# Patient Record
Sex: Female | Born: 1955
Health system: Southern US, Community
[De-identification: ages and names within clinical notes are randomized; demographics above are authoritative.]

## PROBLEM LIST (undated history)

## (undated) DIAGNOSIS — T7840XA Allergy, unspecified, initial encounter: Secondary | ICD-10-CM

## (undated) DIAGNOSIS — I10 Essential (primary) hypertension: Secondary | ICD-10-CM

## (undated) DIAGNOSIS — M549 Dorsalgia, unspecified: Secondary | ICD-10-CM

## (undated) DIAGNOSIS — E559 Vitamin D deficiency, unspecified: Secondary | ICD-10-CM

## (undated) DIAGNOSIS — Z9889 Other specified postprocedural states: Secondary | ICD-10-CM

## (undated) DIAGNOSIS — E785 Hyperlipidemia, unspecified: Secondary | ICD-10-CM

## (undated) DIAGNOSIS — R112 Nausea with vomiting, unspecified: Secondary | ICD-10-CM

## (undated) DIAGNOSIS — M810 Age-related osteoporosis without current pathological fracture: Secondary | ICD-10-CM

## (undated) DIAGNOSIS — E669 Obesity, unspecified: Secondary | ICD-10-CM

## (undated) DIAGNOSIS — M1990 Unspecified osteoarthritis, unspecified site: Secondary | ICD-10-CM

## (undated) DIAGNOSIS — Z8619 Personal history of other infectious and parasitic diseases: Secondary | ICD-10-CM

## (undated) DIAGNOSIS — E039 Hypothyroidism, unspecified: Secondary | ICD-10-CM

## (undated) HISTORY — PX: APPENDECTOMY: SHX54

## (undated) HISTORY — DX: Hypothyroidism, unspecified: E03.9

## (undated) HISTORY — DX: Essential (primary) hypertension: I10

## (undated) HISTORY — PX: TONSILLECTOMY: SUR1361

## (undated) HISTORY — DX: Dorsalgia, unspecified: M54.9

## (undated) HISTORY — DX: Age-related osteoporosis without current pathological fracture: M81.0

## (undated) HISTORY — DX: Obesity, unspecified: E66.9

## (undated) HISTORY — DX: Hyperlipidemia, unspecified: E78.5

## (undated) HISTORY — DX: Allergy, unspecified, initial encounter: T78.40XA

## (undated) HISTORY — PX: OOPHORECTOMY: SHX86

## (undated) HISTORY — DX: Vitamin D deficiency, unspecified: E55.9

---

## 1995-02-16 HISTORY — PX: ABDOMINAL HYSTERECTOMY: SHX81

## 1998-04-02 ENCOUNTER — Other Ambulatory Visit: Admission: RE | Admit: 1998-04-02 | Discharge: 1998-04-02 | Payer: Self-pay | Admitting: *Deleted

## 1999-04-03 ENCOUNTER — Encounter: Payer: Self-pay | Admitting: *Deleted

## 1999-04-03 ENCOUNTER — Encounter: Admission: RE | Admit: 1999-04-03 | Discharge: 1999-04-03 | Payer: Self-pay | Admitting: *Deleted

## 2000-04-04 ENCOUNTER — Encounter: Payer: Self-pay | Admitting: Internal Medicine

## 2000-04-04 ENCOUNTER — Encounter: Admission: RE | Admit: 2000-04-04 | Discharge: 2000-04-04 | Payer: Self-pay | Admitting: Internal Medicine

## 2001-04-05 ENCOUNTER — Encounter: Payer: Self-pay | Admitting: Internal Medicine

## 2001-04-05 ENCOUNTER — Encounter: Admission: RE | Admit: 2001-04-05 | Discharge: 2001-04-05 | Payer: Self-pay | Admitting: Internal Medicine

## 2002-04-10 ENCOUNTER — Encounter: Payer: Self-pay | Admitting: Internal Medicine

## 2002-04-10 ENCOUNTER — Encounter: Admission: RE | Admit: 2002-04-10 | Discharge: 2002-04-10 | Payer: Self-pay | Admitting: Internal Medicine

## 2002-09-10 ENCOUNTER — Ambulatory Visit (HOSPITAL_COMMUNITY): Admission: RE | Admit: 2002-09-10 | Discharge: 2002-09-10 | Payer: Self-pay | Admitting: Gastroenterology

## 2002-12-03 ENCOUNTER — Encounter: Payer: Self-pay | Admitting: Internal Medicine

## 2002-12-03 ENCOUNTER — Encounter: Admission: RE | Admit: 2002-12-03 | Discharge: 2002-12-03 | Payer: Self-pay | Admitting: Internal Medicine

## 2002-12-07 ENCOUNTER — Encounter: Admission: RE | Admit: 2002-12-07 | Discharge: 2002-12-07 | Payer: Self-pay | Admitting: Internal Medicine

## 2002-12-07 ENCOUNTER — Encounter: Payer: Self-pay | Admitting: Internal Medicine

## 2003-04-16 ENCOUNTER — Encounter: Admission: RE | Admit: 2003-04-16 | Discharge: 2003-04-16 | Payer: Self-pay | Admitting: Internal Medicine

## 2003-07-05 ENCOUNTER — Encounter: Admission: RE | Admit: 2003-07-05 | Discharge: 2003-07-05 | Payer: Self-pay | Admitting: Internal Medicine

## 2003-11-14 ENCOUNTER — Encounter: Admission: RE | Admit: 2003-11-14 | Discharge: 2003-11-14 | Payer: Self-pay | Admitting: Internal Medicine

## 2004-04-21 ENCOUNTER — Encounter: Admission: RE | Admit: 2004-04-21 | Discharge: 2004-04-21 | Payer: Self-pay | Admitting: Internal Medicine

## 2004-07-08 ENCOUNTER — Other Ambulatory Visit: Admission: RE | Admit: 2004-07-08 | Discharge: 2004-07-08 | Payer: Self-pay | Admitting: Internal Medicine

## 2004-07-21 ENCOUNTER — Encounter: Admission: RE | Admit: 2004-07-21 | Discharge: 2004-07-21 | Payer: Self-pay | Admitting: Internal Medicine

## 2005-02-15 HISTORY — PX: SHOULDER ARTHROSCOPY W/ ROTATOR CUFF REPAIR: SHX2400

## 2005-04-26 ENCOUNTER — Encounter: Admission: RE | Admit: 2005-04-26 | Discharge: 2005-04-26 | Payer: Self-pay | Admitting: Internal Medicine

## 2006-03-02 ENCOUNTER — Encounter: Admission: RE | Admit: 2006-03-02 | Discharge: 2006-03-02 | Payer: Self-pay | Admitting: *Deleted

## 2006-03-07 ENCOUNTER — Encounter: Admission: RE | Admit: 2006-03-07 | Discharge: 2006-03-07 | Payer: Self-pay | Admitting: *Deleted

## 2006-04-20 ENCOUNTER — Encounter: Admission: RE | Admit: 2006-04-20 | Discharge: 2006-04-20 | Payer: Self-pay | Admitting: *Deleted

## 2006-05-03 ENCOUNTER — Encounter: Admission: RE | Admit: 2006-05-03 | Discharge: 2006-05-03 | Payer: Self-pay | Admitting: *Deleted

## 2006-08-09 ENCOUNTER — Other Ambulatory Visit: Admission: RE | Admit: 2006-08-09 | Discharge: 2006-08-09 | Payer: Self-pay | Admitting: *Deleted

## 2007-05-24 ENCOUNTER — Encounter: Admission: RE | Admit: 2007-05-24 | Discharge: 2007-05-24 | Payer: Self-pay | Admitting: Family Medicine

## 2007-10-30 ENCOUNTER — Encounter: Payer: Self-pay | Admitting: Family Medicine

## 2008-05-24 ENCOUNTER — Encounter: Admission: RE | Admit: 2008-05-24 | Discharge: 2008-05-24 | Payer: Self-pay | Admitting: Family Medicine

## 2008-06-11 ENCOUNTER — Encounter: Payer: Self-pay | Admitting: Family Medicine

## 2009-05-26 ENCOUNTER — Encounter: Admission: RE | Admit: 2009-05-26 | Discharge: 2009-05-26 | Payer: Self-pay | Admitting: Family Medicine

## 2009-12-17 ENCOUNTER — Encounter (INDEPENDENT_AMBULATORY_CARE_PROVIDER_SITE_OTHER): Payer: Self-pay | Admitting: *Deleted

## 2009-12-17 LAB — CONVERTED CEMR LAB
ALT: 24 units/L
AST: 20 units/L
Alkaline Phosphatase: 71 units/L
BUN: 16 mg/dL
Calcium: 10 mg/dL
Chloride, Serum: 102 mmol/L
Creatinine, Ser: 0.71 mg/dL
Glucose, Bld: 128 mg/dL
Hemoglobin: 8 g/dL
LDL Cholesterol: 107 mg/dL
Total Protein: 7.2 g/dL
WBC, blood: 7.8 10*3/uL

## 2010-02-15 DIAGNOSIS — Z8619 Personal history of other infectious and parasitic diseases: Secondary | ICD-10-CM

## 2010-02-15 HISTORY — DX: Personal history of other infectious and parasitic diseases: Z86.19

## 2010-03-25 ENCOUNTER — Encounter: Payer: Self-pay | Admitting: Family Medicine

## 2010-03-25 ENCOUNTER — Ambulatory Visit (INDEPENDENT_AMBULATORY_CARE_PROVIDER_SITE_OTHER): Payer: BC Managed Care – PPO | Admitting: Family Medicine

## 2010-03-25 DIAGNOSIS — F4320 Adjustment disorder, unspecified: Secondary | ICD-10-CM | POA: Insufficient documentation

## 2010-03-25 DIAGNOSIS — I10 Essential (primary) hypertension: Secondary | ICD-10-CM | POA: Insufficient documentation

## 2010-04-02 NOTE — Assessment & Plan Note (Signed)
Summary: new pt to estab--bcbs--ns fee///sph   Vital Signs:  Patient profile:   55 year old female Height:      67 inches Weight:      194 pounds BMI:     30.49 Pulse rate:   80 / minute BP sitting:   126 / 72  (left arm)  Vitals Entered By: Doristine Devoid CMA (March 25, 2010 9:57 AM) CC: NEW EST-no concerns   History of Present Illness: 55 yo woman here today to establish care.  Previous MD- Deboraha Sprang at [facility].  last CPE Spring of 2011.  1) HTN- just dx'd in Jan 2012 and started on Norvasc.  pt feels elevated BP is stress related- caring for mom who has a lot of health problems.  no CP, SOB, HAs, visual changes, edema.  2) mood- feeling overwhelmed, increased tearfulness, denies short temper.  sleeping well.  feels it is situational.  has good support system in place (husband, brother).  has scaled back amount of time she is spending at [employer]- this is helping.  has not been in counseling.  not interested in meds.  Preventive Screening-Counseling & Management  Alcohol-Tobacco     Alcohol drinks/day: <1     Smoking Status: never  Caffeine-Diet-Exercise     Does Patient Exercise: no      Sexual History:  currently monogamous.        Drug Use:  never.    Current Medications (verified): 1)  Norvasc 5 Mg Tabs (Amlodipine Besylate) .... Take One Tablet Daily 2)  Calcium 500 Mg Tabs (Calcium) .... Take One Tablet Daily 3)  Baby Aspirin 81 Mg Chew (Aspirin) .... Take One Tablet Daily 4)  Fish Oil  Oil (Fish Oil) .... Take One Tablet Daily  Allergies (verified): 1)  ! Codeine  Past History:  Past Medical History: Hypertension  Past Surgical History: Appendectomy Hysterectomy Oophorectomy Tonsillectomy  Family History: CAD-no HTN-mother,father,brother DM-mother STROKE-no COLON CA-no BREAST CA-mother age 42  Social History: married works as Physiological scientist step daughter, 2 grandkidsSmoking Status:  never Does Patient Exercise:  no Sexual History:   currently monogamous Drug Use:  never  Review of Systems      See HPI  Physical Exam  General:  Well-developed,well-nourished,in no acute distress; alert,appropriate and cooperative throughout examination Head:  Normocephalic and atraumatic without obvious abnormalities. No apparent alopecia or balding. Eyes:  PERRL, EOMI Neck:  No deformities, masses, or tenderness noted. Lungs:  Normal respiratory effort, chest expands symmetrically. Lungs are clear to auscultation, no crackles or wheezes. Heart:  Normal rate and regular rhythm. S1 and S2 normal without gallop, murmur, click, rub or other extra sounds. Pulses:  +2 carotid, radial, DP Extremities:  no C/C/E Neurologic:  alert & oriented X3, cranial nerves II-XII intact, strength normal in all extremities, gait normal, and DTRs symmetrical and normal.   Skin:  turgor normal and color normal.   Cervical Nodes:  No lymphadenopathy noted Psych:  Oriented X3, memory intact for recent and remote, normally interactive, good eye contact, not anxious appearing, and not depressed appearing.     Impression & Recommendations:  Problem # 1:  HYPERTENSION (ICD-401.9) Assessment New pt's BP well controlled.  asymptomatic.  tolerating meds w/out difficulty.  no changes, will continue to follow. Her updated medication list for this problem includes:    Norvasc 5 Mg Tabs (Amlodipine besylate) .Marland Kitchen... Take one tablet daily  Problem # 2:  ADJUSTMENT DISORDER (ICD-309.9) Assessment: New pt overwhelmed w/ caring for mother but  has made some recent changes that have improved situation.  has good support system in place.  not interested in meds at this time.  will follow.  Complete Medication List: 1)  Norvasc 5 Mg Tabs (Amlodipine besylate) .... Take one tablet daily 2)  Calcium 500 Mg Tabs (Calcium) .... Take one tablet daily 3)  Baby Aspirin 81 Mg Chew (Aspirin) .... Take one tablet daily 4)  Fish Oil Oil (Fish oil) .... Take one tablet  daily  Patient Instructions: 1)  We'll review your records and contact you about scheduling a physical 2)  Your blood pressure looks great!  Keep up the good work! 3)  It sounds like you're doing the best for your mom- hang in there!!! 4)  Call with any questions or concerns 5)  Welcome!  We're glad to have you!   Orders Added: 1)  New Patient Level II [99202]   Immunization History:  Pneumovax Immunization History:    Pneumovax:  historical (02/15/2009)   Immunization History:  Pneumovax Immunization History:    Pneumovax:  Historical (02/15/2009)   Preventive Care Screening  Last Tetanus Booster:    Date:  02/16/2007    Results:  Historical   Colonoscopy:    Date:  02/16/2007    Results:  normal

## 2010-04-07 ENCOUNTER — Encounter (INDEPENDENT_AMBULATORY_CARE_PROVIDER_SITE_OTHER): Payer: Self-pay | Admitting: *Deleted

## 2010-04-14 NOTE — Miscellaneous (Signed)
  Clinical Lists Changes  Observations: Added new observation of TSH: 0.22 microintl units/mL (12/17/2009 10:21) Added new observation of TRIGLYC TOT: 78 mg/dL (29/56/2130 86:57) Added new observation of LDL: 107 mg/dL (84/69/6295 28:41) Added new observation of HDL: 58 mg/dL (32/44/0102 72:53) Added new observation of CHOLESTEROL: 188 mg/dL (66/44/0347 42:59) Added new observation of BILI TOTAL: 0.6 mg/dL (56/38/7564 33:29) Added new observation of ALK PHOS: 71 units/L (12/17/2009 10:21) Added new observation of SGPT (ALT): 24 units/L (12/17/2009 10:21) Added new observation of SGOT (AST): 20 units/L (12/17/2009 10:21) Added new observation of HGBA1C: 6.1 % (12/17/2009 10:21) Added new observation of PROTEIN, TOT: 7.2 g/dL (51/88/4166 06:30) Added new observation of ALBUMIN: 5.0 g/dL (16/02/930 35:57) Added new observation of CALCIUM: 10.0 mg/dL (32/20/2542 70:62) Added new observation of GLUCOSE SER: 128 mg/dL (37/62/8315 17:61) Added new observation of CREATININE: 0.71 mg/dL (60/73/7106 26:94) Added new observation of BUN: 16 mg/dL (85/46/2703 50:09) Added new observation of CO2 TOTAL: 31 mmol/L (12/17/2009 10:21) Added new observation of CHLORIDE: 102 mmol/L (12/17/2009 10:21) Added new observation of POTASSIUM: 4.3 mmol/L (12/17/2009 10:21) Added new observation of SODIUM: 140 mmol/L (12/17/2009 10:21) Added new observation of PLATELETS: 237 10*3/mm3 (12/17/2009 10:21) Added new observation of MCH: 29.5 pg (12/17/2009 10:21) Added new observation of MCV: 87.0 fL (12/17/2009 10:21) Added new observation of HCT: 44.8 % (12/17/2009 10:21) Added new observation of HGB: 8 g/dL (38/18/2993 71:69) Added new observation of RBC: 5.15 10*6/mm3 (12/17/2009 10:21) Added new observation of WBC: 7.8 10*3/mm3 (12/17/2009 10:21) Added new observation of MAMMOGRAM: normal  (05/26/2009 10:48) Added new observation of BONE DENSITY: osteopenia std dev (06/11/2008 10:48) Changed observation from  COLONOSCOPY: normal (02/16/2007 10:01) to COLONOSCOPY: normal  (10/30/2007 10:01)      Preventive Care Screening  Mammogram:    Date:  05/26/2009    Results:  normal   Bone Density:    Date:  06/11/2008    Results:  osteopenia std dev

## 2010-04-14 NOTE — Procedures (Signed)
Summary: Colonoscopy Report/[facility]   Colonoscopy Report/[facility]   Imported By: Maryln Gottron 04/10/2010 13:09:05  _____________________________________________________________________  External Attachment:    Type:   Image     Comment:   External Document

## 2010-05-21 ENCOUNTER — Ambulatory Visit (INDEPENDENT_AMBULATORY_CARE_PROVIDER_SITE_OTHER): Payer: BC Managed Care – PPO | Admitting: Family Medicine

## 2010-05-21 ENCOUNTER — Encounter: Payer: Self-pay | Admitting: Family Medicine

## 2010-05-21 ENCOUNTER — Other Ambulatory Visit: Payer: Self-pay | Admitting: Family Medicine

## 2010-05-21 DIAGNOSIS — M549 Dorsalgia, unspecified: Secondary | ICD-10-CM | POA: Insufficient documentation

## 2010-05-21 DIAGNOSIS — I10 Essential (primary) hypertension: Secondary | ICD-10-CM

## 2010-05-21 DIAGNOSIS — B029 Zoster without complications: Secondary | ICD-10-CM

## 2010-05-21 DIAGNOSIS — Z1231 Encounter for screening mammogram for malignant neoplasm of breast: Secondary | ICD-10-CM

## 2010-05-21 MED ORDER — TRAMADOL HCL 50 MG PO TABS: 50.0000 mg | ORAL_TABLET | Freq: Four times a day (QID) | ORAL | Status: DC | PRN

## 2010-05-21 MED ORDER — VALACYCLOVIR HCL 1 G PO TABS: 1000.0000 mg | ORAL_TABLET | Freq: Three times a day (TID) | ORAL | Status: AC

## 2010-05-21 MED ORDER — AMLODIPINE BESYLATE 5 MG PO TABS: 5.0000 mg | ORAL_TABLET | Freq: Every day | ORAL | Status: DC

## 2010-05-21 NOTE — Assessment & Plan Note (Signed)
Pt's BP is poorly controlled today.  Feels this is due to pain.  Will not change meds at this time- last visit BP was well controlled.  Will follow.

## 2010-05-21 NOTE — Progress Notes (Signed)
  Subjective:    Patient ID: [name] [name], female    DOB: Oct 15, 1955, 55 y.o.   MRN: [id number]  HPI Rash- under L breast.  Noticed Monday.  Denies itching, drainage, pus.  Area is painful.  Back pain- started Sunday, thoracic on L side.  Did yard work on Saturday.  Advil w/out relief.  Thought she had pulled something but now isn't sure.  Pain is constant, no change w/ movement.  Feels this is contributing to elevated BP.  HTN- BP is elevated today.  Pt thinks it's due to pain.  No CP, SOB, HAs, visual changes, edema.   Review of Systems For ROS see HPI     Objective:   Physical Exam  Constitutional: She is oriented to person, place, and time. She appears well-developed and well-nourished. No distress.  HENT:  Head: Normocephalic and atraumatic.  Cardiovascular: Normal rate, regular rhythm and normal heart sounds.   Pulmonary/Chest: Effort normal and breath sounds normal. No respiratory distress.  Musculoskeletal: She exhibits no edema.       Thoracic back: She exhibits tenderness and pain. She exhibits normal range of motion, no swelling, no edema and no deformity.       Back:  Neurological: She is alert and oriented to person, place, and time.  Skin:       Erythematous area w/ scattered vesicles under L breast consistent w/ zoster          Assessment & Plan:

## 2010-05-21 NOTE — Assessment & Plan Note (Signed)
Start Valtrex.  Tramadol prn for pain.  Reviewed supportive care and red flags that should prompt return.  Pt expressed understanding and is in agreement w/ plan.

## 2010-05-21 NOTE — Patient Instructions (Signed)
Schedule your physical at your convenience in the next 3-6 weeks This is shingles and will unfortunately run its course The Valtrex will shorten the course and make it less severe Take the Tramadol as needed for pain Call with any questions or concerns Hang in there!!

## 2010-05-21 NOTE — Assessment & Plan Note (Signed)
Pt's back pain is along same dermatome as shingles.  Continue advil prn.  Add tramadol.

## 2010-05-25 ENCOUNTER — Telehealth: Payer: Self-pay | Admitting: *Deleted

## 2010-05-25 NOTE — Telephone Encounter (Signed)
Pt aware allergy list updated

## 2010-05-25 NOTE — Telephone Encounter (Signed)
Pt is allergic to codeine so traditional pain meds are not going to work.  Can take tylenol alternating w/ ibuprofen as needed for pain.  Will need to add tramadol to allergy list.

## 2010-05-25 NOTE — Telephone Encounter (Signed)
Pt states that ultram is causing her to vomit. Pt would like to know if something else can be Rx to help with shingles pain . Pt use [facility].

## 2010-05-29 ENCOUNTER — Ambulatory Visit: Payer: BC Managed Care – PPO

## 2010-06-12 ENCOUNTER — Ambulatory Visit
Admission: RE | Admit: 2010-06-12 | Discharge: 2010-06-12 | Disposition: A | Discharge status: Home / Self Care | Payer: BC Managed Care – PPO | Source: Ambulatory Visit | Attending: Family Medicine | Admitting: Family Medicine

## 2010-06-12 DIAGNOSIS — Z1231 Encounter for screening mammogram for malignant neoplasm of breast: Secondary | ICD-10-CM

## 2010-07-03 NOTE — Op Note (Signed)
   NAME:  [name], [name]                        ACCOUNT NO.:  [id number]   MEDICAL RECORD NO.:  [id number]                   PATIENT TYPE:  AMB   LOCATION:  ENDO                                 FACILITY:  [facility]   PHYSICIAN:  John C. Madilyn Fireman, M.D.                 DATE OF BIRTH:  03-27-1955   DATE OF PROCEDURE:  09/10/2002  DATE OF DISCHARGE:                                 OPERATIVE REPORT   PROCEDURE:  Colonoscopy.   INDICATIONS FOR PROCEDURE:  Family history of colon cancer.   DESCRIPTION OF PROCEDURE:  The patient was placed in the left lateral  decubitus position then placed on the pulse monitor with continuous low flow  oxygen delivered by nasal cannula. She was sedated with 62.5 mcg IV fentanyl  and 5 mg IV Versed. The Olympus video colonoscope was inserted into the  rectum and advanced to the cecum, confirmed by transillumination at  McBurney's point and visualization of the ileocecal valve and appendiceal  orifice. The prep was excellent. The cecum, ascending, transverse,  descending and sigmoid colon all appeared normal with no masses, polyps,  diverticula or other mucosal abnormalities. The rectum likewise appeared  normal and retroflexed view of the anus revealed no obvious internal  hemorrhoids. The colonoscope was then withdrawn and the patient returned to  the recovery room in stable condition. The patient tolerated the procedure  well and there were no immediate complications.   IMPRESSION:  Normal colonoscopy.   PLAN:  Repeat study in five years based on her family history.                                               John C. Madilyn Fireman, M.D.    J[facility]/MEDQ  D:  09/10/2002  T:  09/10/2002  Job:  [id number]   cc:   Darius Bump, M.D.  [place]  [place] [address]  Fax: [phone]

## 2010-07-29 ENCOUNTER — Encounter: Payer: Self-pay | Admitting: Family Medicine

## 2010-07-29 ENCOUNTER — Ambulatory Visit (INDEPENDENT_AMBULATORY_CARE_PROVIDER_SITE_OTHER): Payer: BC Managed Care – PPO | Admitting: Family Medicine

## 2010-07-29 VITALS — BP 120/80 | Temp 98.6°F | Ht 67.0 in | Wt 195.1 lb

## 2010-07-29 DIAGNOSIS — Z136 Encounter for screening for cardiovascular disorders: Secondary | ICD-10-CM

## 2010-07-29 DIAGNOSIS — E010 Iodine-deficiency related diffuse (endemic) goiter: Secondary | ICD-10-CM

## 2010-07-29 DIAGNOSIS — E049 Nontoxic goiter, unspecified: Secondary | ICD-10-CM

## 2010-07-29 DIAGNOSIS — E039 Hypothyroidism, unspecified: Secondary | ICD-10-CM | POA: Insufficient documentation

## 2010-07-29 DIAGNOSIS — Z0000 Encounter for general adult medical examination without abnormal findings: Secondary | ICD-10-CM | POA: Insufficient documentation

## 2010-07-29 DIAGNOSIS — M722 Plantar fascial fibromatosis: Secondary | ICD-10-CM | POA: Insufficient documentation

## 2010-07-29 HISTORY — DX: Hypothyroidism, unspecified: E03.9

## 2010-07-29 LAB — HEPATIC FUNCTION PANEL
ALT: 21 U/L (ref 0–35)
AST: 20 U/L (ref 0–37)
Alkaline Phosphatase: 73 U/L (ref 39–117)
Bilirubin, Direct: 0.1 mg/dL (ref 0.0–0.3)
Total Protein: 7.4 g/dL (ref 6.0–8.3)

## 2010-07-29 LAB — TSH: TSH: 0.05 u[IU]/mL — ABNORMAL LOW (ref 0.35–5.50)

## 2010-07-29 LAB — LIPID PANEL
Cholesterol: 199 mg/dL (ref 0–200)
HDL: 55.1 mg/dL (ref >39.00)
LDL Cholesterol: 132 mg/dL — ABNORMAL HIGH (ref 0–99)
Total CHOL/HDL Ratio: 4
Triglycerides: 60 mg/dL (ref 0.0–149.0)
VLDL: 12 mg/dL (ref 0.0–40.0)

## 2010-07-29 LAB — CBC WITH DIFFERENTIAL/PLATELET
Eosinophils Absolute: 0.2 10*3/uL (ref 0.0–0.7)
Hemoglobin: 14.3 g/dL (ref 12.0–15.0)
Lymphocytes Relative: 28.3 % (ref 12.0–46.0)
MCHC: 34.5 g/dL (ref 30.0–36.0)
MCV: 86.8 fl (ref 78.0–100.0)
Monocytes Relative: 6.7 % (ref 3.0–12.0)
Platelets: 235 10*3/uL (ref 150.0–400.0)
RBC: 4.78 Mil/uL (ref 3.87–5.11)
RDW: 12.6 % (ref 11.5–14.6)

## 2010-07-29 LAB — BASIC METABOLIC PANEL: GFR: 94.96 mL/min (ref >60.00)

## 2010-07-29 LAB — HEMOGLOBIN A1C: Hgb A1c MFr Bld: 6.7 % — ABNORMAL HIGH (ref 4.6–6.5)

## 2010-07-29 MED ORDER — NAPROXEN 500 MG PO TABS: 500.0000 mg | ORAL_TABLET | Freq: Two times a day (BID) | ORAL | Status: AC

## 2010-07-29 NOTE — Assessment & Plan Note (Signed)
Start scheduled NSAIDs, ice, and stretching for pain relief.  Reviewed supportive care and red flags that should prompt return.  Pt expressed understanding and is in agreement w/ plan.

## 2010-07-29 NOTE — Patient Instructions (Signed)
Follow up in 6 months to recheck BP Please check with the pharmacy and figure out why you are not getting the generic Amlodipine (norvasc)- call me if you have problems We'll notify you of your lab results Take the Naproxen twice a day for the next 7-10 days (w/ food) and then as needed for the foot pain Ice the foot over the water bottle 2x/day Do the gentle stretches ~3x/day Wear good supportive, cushioned shoes Call with any questions or concerns Hang in there!!!

## 2010-07-29 NOTE — Assessment & Plan Note (Signed)
Pt's PE WNL w/ exception of thyromegaly and plantar fasciitis (see below).  UTD on health maintenance.  Anticipatory guidance provided.  Check labs.

## 2010-07-29 NOTE — Progress Notes (Signed)
  Subjective:    Patient ID: [name] [name], female    DOB: Jul 18, 1955, 55 y.o.   MRN: [id number]  HPI CPE- s/p TAH-BSO, UTD on mammo and colonoscopy.  Plantar fasciitis- sxs started 3 weeks ago.  First step is most painful.  Pain is L foot, primarily in heel.  Wears flip-flops frequently.  Wears cushioned insoles in sneakers.  Hx of similar years ago.  Has not been taking OTC meds.    Review of Systems Patient reports no vision/ hearing changes, adenopathy,fever, weight change,  persistant/recurrent hoarseness , swallowing issues, chest pain, palpitations, edema, persistant/recurrent cough, hemoptysis, dyspnea (rest/exertional/paroxysmal nocturnal), gastrointestinal bleeding (melena, rectal bleeding), abdominal pain, significant heartburn, bowel changes, GU symptoms (dysuria, hematuria, incontinence), Gyn symptoms (abnormal  bleeding, pain),  syncope, focal weakness, memory loss, numbness & tingling, skin/hair/nail changes, abnormal bruising or bleeding, anxiety, or depression.      Objective:   Physical Exam  General Appearance:    Alert, cooperative, no distress, appears stated age  Head:    Normocephalic, without obvious abnormality, atraumatic  Eyes:    PERRL, conjunctiva/corneas clear, EOM's intact, fundi    benign, both eyes  Ears:    Normal TM's and external ear canals, both ears  Nose:   Nares normal, septum midline, mucosa normal, no drainage    or sinus tenderness  Throat:   Lips, mucosa, and tongue normal; teeth and gums normal  Neck:   Supple, symmetrical, trachea midline, no adenopathy;    Thyroid: R sided enlargement w/out obvious nodularity  Back:     Symmetric, no curvature, ROM normal, no CVA tenderness  Lungs:     Clear to auscultation bilaterally, respirations unlabored  Chest Wall:    No tenderness or deformity   Heart:    Regular rate and rhythm, S1 and S2 normal, no murmur, rub   or gallop  Breast Exam:    No tenderness, masses, or nipple abnormality    Abdomen:     Soft, non-tender, bowel sounds active all four quadrants,    no masses, no organomegaly  Genitalia:   Deferred due to TAH-BSO  Rectal:    Deferred  Extremities:   Extremities normal, atraumatic, no cyanosis or edema.  + TTP over insertion of plantar fascia on L  Pulses:   2+ and symmetric all extremities  Skin:   Skin color, texture, turgor normal, no rashes or lesions  Lymph nodes:   Cervical, supraclavicular, and axillary nodes normal  Neurologic:   CNII-XII intact, normal strength, sensation and reflexes    throughout          Assessment & Plan:

## 2010-07-29 NOTE — Assessment & Plan Note (Signed)
Get [place] to assess.  Check labs.

## 2010-07-30 LAB — T3, FREE: T3, Free: 3.7 pg/mL (ref 2.3–4.2)

## 2010-07-31 ENCOUNTER — Ambulatory Visit (HOSPITAL_BASED_OUTPATIENT_CLINIC_OR_DEPARTMENT_OTHER)
Admission: RE | Admit: 2010-07-31 | Discharge: 2010-07-31 | Disposition: A | Discharge status: Home / Self Care | Payer: BC Managed Care – PPO | Source: Ambulatory Visit | Attending: Family Medicine | Admitting: Family Medicine

## 2010-07-31 DIAGNOSIS — E049 Nontoxic goiter, unspecified: Secondary | ICD-10-CM | POA: Insufficient documentation

## 2010-07-31 DIAGNOSIS — E010 Iodine-deficiency related diffuse (endemic) goiter: Secondary | ICD-10-CM

## 2010-07-31 DIAGNOSIS — E041 Nontoxic single thyroid nodule: Secondary | ICD-10-CM

## 2010-08-01 LAB — VITAMIN D 1,25 DIHYDROXY: Vitamin D2 1, 25 (OH)2: <8 pg/mL

## 2010-08-03 ENCOUNTER — Telehealth: Payer: Self-pay

## 2010-08-03 DIAGNOSIS — E041 Nontoxic single thyroid nodule: Secondary | ICD-10-CM

## 2010-08-03 MED ORDER — ATORVASTATIN CALCIUM 40 MG PO TABS: 40.0000 mg | ORAL_TABLET | Freq: Every day | ORAL | Status: AC

## 2010-08-03 NOTE — Telephone Encounter (Signed)
Message copied by Arnette Norris on Mon Aug 03, 2010 11:56 AM ------      Message from: Sheliah Hatch      Created: Thu Jul 30, 2010 12:53 PM       She is officially diabetic.  At this stage she does not need to check her sugars but she really needs to watch her diet and work on regular exercise.  Will need a f/u appt in 3 months to recheck labs and discuss.            Thyroid hormone levels are normal despite and abnormal TSH.  Will repeat this at her appt in 3 months.            LDL is 132.  Since she is officially diabetic her goal is <70.  Needs to start Lipitor 40mg  nightly and recheck LFTs at upcoming appt.

## 2010-08-03 NOTE — Telephone Encounter (Signed)
All labs reviewed with patient and she voiced understanding. Rx sent to pharmacy and referral put in to see ENT for thyroid nodule.  She agreed to follow up in 3 months     KP

## 2010-08-27 ENCOUNTER — Other Ambulatory Visit: Payer: Self-pay | Admitting: Otolaryngology

## 2010-08-27 ENCOUNTER — Other Ambulatory Visit (HOSPITAL_COMMUNITY)
Admission: RE | Admit: 2010-08-27 | Discharge: 2010-08-27 | Disposition: A | Discharge status: Home / Self Care | Payer: BC Managed Care – PPO | Source: Ambulatory Visit | Attending: Otolaryngology | Admitting: Otolaryngology

## 2010-08-27 DIAGNOSIS — E049 Nontoxic goiter, unspecified: Secondary | ICD-10-CM | POA: Insufficient documentation

## 2010-11-06 ENCOUNTER — Encounter: Payer: Self-pay | Admitting: Family Medicine

## 2010-11-06 ENCOUNTER — Ambulatory Visit (INDEPENDENT_AMBULATORY_CARE_PROVIDER_SITE_OTHER): Payer: BC Managed Care – PPO | Admitting: Family Medicine

## 2010-11-06 DIAGNOSIS — J040 Acute laryngitis: Secondary | ICD-10-CM

## 2010-11-06 NOTE — Progress Notes (Signed)
  Subjective:    Patient ID: [name] [name], female    DOB: 1955/06/16, 55 y.o.   MRN: [id number]  HPI Laryngitis- no sore throat, no fevers, sxs started 4 days ago.  No ear pain, cough, mild nasal congestion, minimal PND, no known sick contacts.   Review of Systems For ROS see HPI     Objective:   Physical Exam  Vitals reviewed. Constitutional: She appears well-developed and well-nourished. No distress.  HENT:  Head: Normocephalic and atraumatic.       TMs normal bilaterally No TTP over sinuses + nasal congestion + PND  Eyes: Conjunctivae and EOM are normal. Pupils are equal, round, and reactive to light.  Neck: Normal range of motion. Neck supple.  Cardiovascular: Normal rate, regular rhythm and normal heart sounds.   Pulmonary/Chest: Effort normal and breath sounds normal. No respiratory distress. She has no wheezes. She has no rales.  Lymphadenopathy:    She has no cervical adenopathy.          Assessment & Plan:

## 2010-11-06 NOTE — Patient Instructions (Signed)
This is most likely a viral illness Take Advil (ibuprofen) 2 tabs every 4-6 hrs to help w/ the inflammation of the vocal cords VOCAL REST! Warm liquids Call with any questions or concerns Hang in there!!!

## 2010-11-06 NOTE — Assessment & Plan Note (Signed)
No evidence of bacterial infxn on PE.  Likely viral.  Reviewed supportive care and red flags that should prompt return.  Pt expressed understanding and is in agreement w/ plan.

## 2010-12-11 ENCOUNTER — Ambulatory Visit (INDEPENDENT_AMBULATORY_CARE_PROVIDER_SITE_OTHER): Payer: BC Managed Care – PPO | Admitting: Family Medicine

## 2010-12-11 ENCOUNTER — Encounter: Payer: Self-pay | Admitting: Family Medicine

## 2010-12-11 VITALS — BP 118/82 | HR 70 | Temp 98.5°F | Ht 67.5 in | Wt 193.0 lb

## 2010-12-11 DIAGNOSIS — R059 Cough, unspecified: Secondary | ICD-10-CM

## 2010-12-11 DIAGNOSIS — I10 Essential (primary) hypertension: Secondary | ICD-10-CM

## 2010-12-11 DIAGNOSIS — R05 Cough: Secondary | ICD-10-CM | POA: Insufficient documentation

## 2010-12-11 MED ORDER — BENZONATATE 200 MG PO CAPS: 200.0000 mg | ORAL_CAPSULE | Freq: Three times a day (TID) | ORAL | Status: AC | PRN

## 2010-12-11 MED ORDER — AMLODIPINE BESYLATE 5 MG PO TABS: 5.0000 mg | ORAL_TABLET | Freq: Every day | ORAL | Status: DC

## 2010-12-11 MED ORDER — AZITHROMYCIN 250 MG PO TABS: 250.0000 mg | ORAL_TABLET | Freq: Every day | ORAL | Status: AC

## 2010-12-11 NOTE — Patient Instructions (Signed)
This seems to be all allergy related Take the Azithromycin for any possible bronchitis Start Claritin or Zyrtec daily Add mucincex to thin your congestion Drink plenty of fluids Cough meds as needed Hang in there!!!

## 2010-12-11 NOTE — Assessment & Plan Note (Signed)
Pt's sxs most likely due to untreated nasal allergies and PND but given duration of sxs will start Azithromycin for possible bronchitis.  Reviewed supportive care and red flags that should prompt return.  Pt expressed understanding and is in agreement w/ plan.

## 2010-12-11 NOTE — Progress Notes (Signed)
  Subjective:    Patient ID: [name] [name], female    DOB: [birth year], 55 y.o.   MRN: [id number]  HPI Cough- sxs started 3-4 weeks ago.  Cough is nonproductive but wet sounding.  + nasal congestion- clear drainage.  Denies facial pain/pressure.  No ear pain.  No fevers.  No known sick contacts.  Has never had seasonal allergies.   Review of Systems For ROS see HPI     Objective:   Physical Exam  Vitals reviewed. Constitutional: She appears well-developed and well-nourished. No distress.  HENT:  Head: Normocephalic and atraumatic.  Right Ear: Tympanic membrane normal.  Left Ear: Tympanic membrane normal.  Nose: Mucosal edema and rhinorrhea present. Right sinus exhibits no maxillary sinus tenderness and no frontal sinus tenderness. Left sinus exhibits no maxillary sinus tenderness and no frontal sinus tenderness.  Mouth/Throat: Mucous membranes are normal. Posterior oropharyngeal erythema (w/ PND) present.  Eyes: Conjunctivae and EOM are normal. Pupils are equal, round, and reactive to light.  Neck: Normal range of motion. Neck supple.  Cardiovascular: Normal rate, regular rhythm and normal heart sounds.   Pulmonary/Chest: Effort normal and breath sounds normal. No respiratory distress. She has no wheezes. She has no rales.       + rattling cough  Lymphadenopathy:    She has no cervical adenopathy.          Assessment & Plan:

## 2010-12-16 ENCOUNTER — Other Ambulatory Visit: Payer: Self-pay | Admitting: Dermatology

## 2011-01-28 ENCOUNTER — Encounter: Payer: Self-pay | Admitting: Family Medicine

## 2011-01-28 ENCOUNTER — Ambulatory Visit (INDEPENDENT_AMBULATORY_CARE_PROVIDER_SITE_OTHER): Payer: BC Managed Care – PPO | Admitting: Family Medicine

## 2011-01-28 VITALS — BP 115/75 | HR 90 | Temp 98.0°F | Ht 67.25 in | Wt 191.4 lb

## 2011-01-28 DIAGNOSIS — Z131 Encounter for screening for diabetes mellitus: Secondary | ICD-10-CM

## 2011-01-28 DIAGNOSIS — I10 Essential (primary) hypertension: Secondary | ICD-10-CM

## 2011-01-28 DIAGNOSIS — E119 Type 2 diabetes mellitus without complications: Secondary | ICD-10-CM | POA: Insufficient documentation

## 2011-01-28 DIAGNOSIS — M8580 Other specified disorders of bone density and structure, unspecified site: Secondary | ICD-10-CM

## 2011-01-28 DIAGNOSIS — M899 Disorder of bone, unspecified: Secondary | ICD-10-CM

## 2011-01-28 DIAGNOSIS — M810 Age-related osteoporosis without current pathological fracture: Secondary | ICD-10-CM | POA: Insufficient documentation

## 2011-01-28 DIAGNOSIS — E785 Hyperlipidemia, unspecified: Secondary | ICD-10-CM

## 2011-01-28 LAB — HEPATIC FUNCTION PANEL
ALT: 23 U/L (ref 0–35)
AST: 24 U/L (ref 0–37)
Alkaline Phosphatase: 73 U/L (ref 39–117)
Bilirubin, Direct: 0 mg/dL (ref 0.0–0.3)
Total Bilirubin: 0.5 mg/dL (ref 0.3–1.2)

## 2011-01-28 LAB — BASIC METABOLIC PANEL
Chloride: 104 mEq/L (ref 96–112)
Potassium: 3.7 mEq/L (ref 3.5–5.1)
Sodium: 141 mEq/L (ref 135–145)

## 2011-01-28 LAB — LIPID PANEL
LDL Cholesterol: 106 mg/dL — ABNORMAL HIGH (ref 0–99)
Total CHOL/HDL Ratio: 3

## 2011-01-28 NOTE — Assessment & Plan Note (Signed)
New.  Pt started Lipitor- is tolerating this w/out difficulty.  Check labs.  Adjust meds prn

## 2011-01-28 NOTE — Patient Instructions (Signed)
Follow up in 3 months to recheck sugar We'll notify you of your lab results Try and make healthy food choices and get regular exercise Call with any questions or concerns Happy Holidays!!!

## 2011-01-28 NOTE — Assessment & Plan Note (Signed)
Due for repeat DEXA. 

## 2011-01-28 NOTE — Assessment & Plan Note (Signed)
Chronic problem.  Well controlled.  Asymptomatic.  No changes. 

## 2011-01-28 NOTE — Assessment & Plan Note (Signed)
dx'd based on labs at CPE.  Pt is not ready to accept this.  Did not follow up as recommended.  Due for labs today.  Will start meds prn.  Again encouraged healthy diet and regular exercise.  Pt expressed understanding and is in agreement w/ plan.

## 2011-01-28 NOTE — Progress Notes (Signed)
  Subjective:    Patient ID: [name] [name], female    DOB: 02/03/1956, 55 y.o.   MRN: [id number]  HPI HTN- chronic problem, excellent control today on Norvasc.  No CP, SOB, HAs, visual changes, edema.  DM- new problem for pt.  Not on meds.  Not exercising.  Her goal has been to lose weight but this has not happened.  Hyperlipidemia- new problem for pt.  Started Lipitor in June.  No abd pain, N/V, myalgias.  Osteopenia- had last DEXA 2/10.  Due for another.   Review of Systems For ROS see HPI     Objective:   Physical Exam  Vitals reviewed. Constitutional: She is oriented to person, place, and time. She appears well-developed and well-nourished. No distress.  HENT:  Head: Normocephalic and atraumatic.  Eyes: Conjunctivae and EOM are normal. Pupils are equal, round, and reactive to light.  Neck: Normal range of motion. Neck supple. No thyromegaly present.  Cardiovascular: Normal rate, regular rhythm, normal heart sounds and intact distal pulses.   No murmur heard. Pulmonary/Chest: Effort normal and breath sounds normal. No respiratory distress.  Abdominal: Soft. She exhibits no distension. There is no tenderness.  Musculoskeletal: She exhibits no edema.  Lymphadenopathy:    She has no cervical adenopathy.  Neurological: She is alert and oriented to person, place, and time.  Skin: Skin is warm and dry.  Psychiatric: She has a normal mood and affect. Her behavior is normal.          Assessment & Plan:

## 2011-02-24 ENCOUNTER — Other Ambulatory Visit: Payer: Self-pay | Admitting: Orthopedic Surgery

## 2011-03-01 ENCOUNTER — Encounter (HOSPITAL_BASED_OUTPATIENT_CLINIC_OR_DEPARTMENT_OTHER): Payer: Self-pay | Admitting: *Deleted

## 2011-03-01 NOTE — Progress Notes (Signed)
Pt to arrive 6am-need istat Says in hx-icd-pt denies icd-not on any blood thinner- No note by [place] pcp about this

## 2011-03-01 NOTE — H&P (Signed)
[name] [name] is a 56 year-old right-hand dominant female referred by Dr. Emily Filbert for consultation with respect to a mass on her right thumb.  This has been present for the past six to eight months.  It was drained approximately 10 days ago.  This has recurred.  She has no history of injury, no history of diabetes or thyroid problems. She was told this was a mucoid cyst. She complains of constant, moderate, sharp pain with a feeling of swelling.  Hitting the area causes problems for her. She has been wearing a Band-Aid for protection.   ALLERGIES:     Codeine.  MEDICATIONS:    Norvasc, multivitamins.  SURGICAL HISTORY:    Hysterectomy.  FAMILY MEDICAL HISTORY:   Positive for diabetes, high blood pressure and arthritis.  SOCIAL HISTORY:     She does not smoke or drink. She is married.  She is an Print production planner at [name]  REVIEW OF SYSTEMS:    Positive for high blood pressure, otherwise negative 14 points       [name] [name] is an 56 y.o. female.   Chief Complaint: mucoid cyst rt thumb HPI: see above   Past Medical History  Diagnosis Date  . Hypertension   . Hyperlipidemia   . Diabetes mellitus     diet controlled    Past Surgical History  Procedure Date  . Appendectomy   . Abdominal hysterectomy   . Tonsillectomy   . Oophorectomy   . Shoulder arthroscopy w/ rotator cuff repair 2007    rt    Family History  Problem Relation Age of Onset  . Hypertension Mother   . Hypertension Father   . Hypertension Brother   . Diabetes Mother   . Breast cancer Mother 67   Social History:  reports that she has never smoked. She does not have any smokeless tobacco history on file. She reports that she does not drink alcohol or use illicit drugs.  Allergies:  Allergies  Allergen Reactions  . Codeine   . Tramadol     Medications Prior to Admission  Medication Dose Route Frequency Provider Last Rate Last Dose  . ceFAZolin (ANCEF) IVPB 2 g/50 mL premix  2 g  Intravenous 60 min Pre-Op Nicki Reaper, MD      . chlorhexidine (HIBICLENS) 4 % liquid 4 application  60 mL Topical Once       . lactated ringers infusion   Intravenous Continuous Zenon Mayo, MD 10 mL/hr at 03/02/11 1046    . DISCONTD: ceFAZolin (ANCEF) IVPB 1 g/50 mL premix  1 g Intravenous 60 min Pre-Op        Medications Prior to Admission  Medication Sig Dispense Refill  . amLODipine (NORVASC) 5 MG tablet Take 1 tablet (5 mg total) by mouth daily.  30 tablet  6  . Ascorbic Acid (VITAMIN C) 1000 MG tablet Take 1,000 mg by mouth daily.        Marland Kitchen atorvastatin (LIPITOR) 40 MG tablet Take 1 tablet (40 mg total) by mouth daily.  30 tablet  2  . calcium carbonate (CALCIUM 500) 1250 MG tablet Take 1 tablet by mouth daily.        . Cholecalciferol (VITAMIN D-3) 5000 UNITS TABS Take by mouth daily.        Marland Kitchen glucosamine-chondroitin 500-400 MG tablet Take 1 tablet by mouth daily.        . Multiple Vitamin (MULTIVITAMIN) tablet Take 1 tablet by mouth daily.        Marland Kitchen  mupirocin ointment (BACTROBAN) 2 %       . naproxen (NAPROSYN) 500 MG tablet Take 1 tablet (500 mg total) by mouth 2 (two) times daily with a meal.  60 tablet  2  . aspirin 81 MG EC tablet Take 81 mg by mouth daily.        . Omega-3 Fatty Acids (FISH OIL PO) Take by mouth daily.          Results for orders placed during the hospital encounter of 03/02/11 (from the past 48 hour(s))  POCT I-STAT, CHEM 8     Status: Abnormal   Collection Time   03/02/11 10:42 AM      Component Value Range Comment   Sodium 143  135 - 145 (mEq/L)    Potassium 4.4  3.5 - 5.1 (mEq/L)    Chloride 105  96 - 112 (mEq/L)    BUN 13  6 - 23 (mg/dL)    Creatinine, Ser 0.86  0.50 - 1.10 (mg/dL)    Glucose, Bld 578 (*) 70 - 99 (mg/dL)    Calcium, Ion 4.69  1.12 - 1.32 (mmol/L)    TCO2 27  0 - 100 (mmol/L)    Hemoglobin 15.0  12.0 - 15.0 (g/dL)    HCT 62.9  52.8 - 41.3 (%)     No results found.   Pertinent items are noted in HPI.  Blood pressure  130/85, pulse 72, temperature 98.5 F (36.9 C), temperature source Oral, resp. rate 16, height 5\' 7"  (1.702 m), weight 86.183 kg (190 lb), SpO2 97.00%.  General appearance: alert, cooperative and appears stated age Head: Normocephalic, without obvious abnormality Neck: no adenopathy Resp: clear to auscultation bilaterally Cardio: regular rate and rhythm, S1, S2 normal, no murmur, click, rub or gallop GI: soft, non-tender; bowel sounds normal; no masses,  no organomegaly Extremities: extremities normal, atraumatic, no cyanosis or edema Pulses: 2+ and symmetric Skin: Skin color, texture, turgor normal. No rashes or lesions Neurologic: Grossly normal Incision/Wound: na  Assessment/Plan We have discussed the etiology of the mucoid cyst with her and her husband.   We would recommend surgical excision with debridement of the joint.  The pre, peri and postoperative course were discussed along with the risks and complications.  The patient is aware that even with debridement of the joint there is potential for recurrence and possibility of infection, recurrence, injury to arteries, nerves, tendon, stiffness are all discussed.  The possibility of the necessity of fusion to be certain that another cyst does not occur, but we would not recommend that at this time.  They agree to proceed to have this excised. She is scheduled for excision mucoid cyst, debridement interphalangeal joint right thumb as an outpatient at [facility].     Dejuana Weist R 03/02/2011, 11:32 AM

## 2011-03-02 ENCOUNTER — Encounter (HOSPITAL_BASED_OUTPATIENT_CLINIC_OR_DEPARTMENT_OTHER): Payer: Self-pay | Admitting: Anesthesiology

## 2011-03-02 ENCOUNTER — Encounter (HOSPITAL_BASED_OUTPATIENT_CLINIC_OR_DEPARTMENT_OTHER)
Admission: RE | Disposition: A | Discharge status: Home / Self Care | Payer: Self-pay | Source: Ambulatory Visit | Attending: Orthopedic Surgery

## 2011-03-02 ENCOUNTER — Other Ambulatory Visit: Payer: Self-pay | Admitting: Orthopedic Surgery

## 2011-03-02 ENCOUNTER — Encounter (HOSPITAL_BASED_OUTPATIENT_CLINIC_OR_DEPARTMENT_OTHER): Payer: Self-pay | Admitting: *Deleted

## 2011-03-02 ENCOUNTER — Encounter (HOSPITAL_BASED_OUTPATIENT_CLINIC_OR_DEPARTMENT_OTHER): Payer: Self-pay | Admitting: Orthopedic Surgery

## 2011-03-02 ENCOUNTER — Ambulatory Visit (HOSPITAL_BASED_OUTPATIENT_CLINIC_OR_DEPARTMENT_OTHER): Payer: BC Managed Care – PPO | Admitting: Anesthesiology

## 2011-03-02 ENCOUNTER — Ambulatory Visit (HOSPITAL_BASED_OUTPATIENT_CLINIC_OR_DEPARTMENT_OTHER)
Admission: RE | Admit: 2011-03-02 | Discharge: 2011-03-02 | Disposition: A | Discharge status: Home / Self Care | Payer: BC Managed Care – PPO | Source: Ambulatory Visit | Attending: Orthopedic Surgery | Admitting: Orthopedic Surgery

## 2011-03-02 DIAGNOSIS — I10 Essential (primary) hypertension: Secondary | ICD-10-CM | POA: Insufficient documentation

## 2011-03-02 DIAGNOSIS — E119 Type 2 diabetes mellitus without complications: Secondary | ICD-10-CM | POA: Insufficient documentation

## 2011-03-02 DIAGNOSIS — M6740 Ganglion, unspecified site: Secondary | ICD-10-CM | POA: Insufficient documentation

## 2011-03-02 HISTORY — PX: MASS EXCISION: SHX2000

## 2011-03-02 LAB — POCT I-STAT, CHEM 8
HCT: 44 % (ref 36.0–46.0)
Hemoglobin: 15 g/dL (ref 12.0–15.0)
Potassium: 4.4 mEq/L (ref 3.5–5.1)
Sodium: 143 mEq/L (ref 135–145)

## 2011-03-02 SURGERY — EXCISION MASS
Anesthesia: Monitor Anesthesia Care | Site: Thumb | Laterality: Right | Wound class: Clean

## 2011-03-02 MED ORDER — PROPOFOL 10 MG/ML IV EMUL
INTRAVENOUS | Status: DC | PRN
  Administered 2011-03-02: 80 ug/kg/min via INTRAVENOUS

## 2011-03-02 MED ORDER — CEFAZOLIN SODIUM 1-5 GM-% IV SOLN: 1.0000 g | INTRAVENOUS | Status: DC

## 2011-03-02 MED ORDER — BUPIVACAINE HCL (PF) 0.25 % IJ SOLN
INTRAMUSCULAR | Status: DC | PRN
  Administered 2011-03-02: 5 mL

## 2011-03-02 MED ORDER — CEFAZOLIN SODIUM-DEXTROSE 2-3 GM-% IV SOLR: 2.0000 g | INTRAVENOUS | Status: DC

## 2011-03-02 MED ORDER — FENTANYL CITRATE 0.05 MG/ML IJ SOLN
INTRAMUSCULAR | Status: DC | PRN
  Administered 2011-03-02: 100 ug via INTRAVENOUS

## 2011-03-02 MED ORDER — CEFAZOLIN SODIUM 1-5 GM-% IV SOLN
1.0000 g | INTRAVENOUS | Status: AC
  Administered 2011-03-02: 2 g via INTRAVENOUS

## 2011-03-02 MED ORDER — PENTAZOCINE-NALOXONE 50-0.5 MG PO TABS: 1.0000 | ORAL_TABLET | ORAL | Status: AC | PRN

## 2011-03-02 MED ORDER — MORPHINE SULFATE 2 MG/ML IJ SOLN: 0.0500 mg/kg | INTRAMUSCULAR | Status: DC | PRN

## 2011-03-02 MED ORDER — LIDOCAINE HCL (CARDIAC) 20 MG/ML IV SOLN
INTRAVENOUS | Status: DC | PRN
  Administered 2011-03-02: 60 mg via INTRAVENOUS

## 2011-03-02 MED ORDER — CHLORHEXIDINE GLUCONATE 4 % EX LIQD: 60.0000 mL | Freq: Once | CUTANEOUS | Status: DC

## 2011-03-02 MED ORDER — LACTATED RINGERS IV SOLN
INTRAVENOUS | Status: DC
  Administered 2011-03-02: 11:00:00 via INTRAVENOUS

## 2011-03-02 MED ORDER — 0.9 % SODIUM CHLORIDE (POUR BTL) OPTIME
TOPICAL | Status: DC | PRN
  Administered 2011-03-02: 1000 mL

## 2011-03-02 MED ORDER — MIDAZOLAM HCL 5 MG/5ML IJ SOLN
INTRAMUSCULAR | Status: DC | PRN
  Administered 2011-03-02: 2 mg via INTRAVENOUS

## 2011-03-02 MED ORDER — FENTANYL CITRATE 0.05 MG/ML IJ SOLN: 25.0000 ug | INTRAMUSCULAR | Status: DC | PRN

## 2011-03-02 MED ORDER — METOCLOPRAMIDE HCL 5 MG/ML IJ SOLN: 10.0000 mg | Freq: Once | INTRAMUSCULAR | Status: DC | PRN

## 2011-03-02 SURGICAL SUPPLY — 48 items
BANDAGE COBAN STERILE 2 (GAUZE/BANDAGES/DRESSINGS) IMPLANT
BANDAGE GAUZE ELAST BULKY 4 IN (GAUZE/BANDAGES/DRESSINGS) IMPLANT
BLADE MINI RND TIP GREEN BEAV (BLADE) IMPLANT
BLADE SURG 15 STRL LF DISP TIS (BLADE) ×1 IMPLANT
BLADE SURG 15 STRL SS (BLADE) ×2
BNDG CMPR 9X4 STRL LF SNTH (GAUZE/BANDAGES/DRESSINGS)
BNDG COHESIVE 1X5 TAN STRL LF (GAUZE/BANDAGES/DRESSINGS) ×1 IMPLANT
BNDG COHESIVE 3X5 TAN STRL LF (GAUZE/BANDAGES/DRESSINGS) IMPLANT
BNDG ESMARK 4X9 LF (GAUZE/BANDAGES/DRESSINGS) IMPLANT
CHLORAPREP W/TINT 26ML (MISCELLANEOUS) ×2 IMPLANT
CLOTH BEACON ORANGE TIMEOUT ST (SAFETY) ×1 IMPLANT
CORDS BIPOLAR (ELECTRODE) ×2 IMPLANT
COVER MAYO STAND STRL (DRAPES) ×2 IMPLANT
COVER TABLE BACK 60X90 (DRAPES) ×2 IMPLANT
CUFF TOURNIQUET SINGLE 18IN (TOURNIQUET CUFF) ×1 IMPLANT
DECANTER SPIKE VIAL GLASS SM (MISCELLANEOUS) IMPLANT
DRAIN PENROSE 1/2X12 LTX STRL (WOUND CARE) IMPLANT
DRAPE EXTREMITY T 121X128X90 (DRAPE) ×2 IMPLANT
DRAPE SURG 17X23 STRL (DRAPES) ×2 IMPLANT
GAUZE SPONGE 4X4 12PLY STRL LF (GAUZE/BANDAGES/DRESSINGS) ×1 IMPLANT
GAUZE XEROFORM 1X8 LF (GAUZE/BANDAGES/DRESSINGS) ×2 IMPLANT
GLOVE BIO SURGEON STRL SZ 6.5 (GLOVE) ×3 IMPLANT
GLOVE SURG ORTHO 8.0 STRL STRW (GLOVE) ×2 IMPLANT
GOWN BRE IMP PREV XXLGXLNG (GOWN DISPOSABLE) ×3 IMPLANT
GOWN PREVENTION PLUS XLARGE (GOWN DISPOSABLE) ×3 IMPLANT
NDL SAFETY ECLIPSE 18X1.5 (NEEDLE) ×1 IMPLANT
NEEDLE 27GAX1X1/2 (NEEDLE) IMPLANT
NEEDLE HYPO 18GX1.5 SHARP (NEEDLE) ×2
NS IRRIG 1000ML POUR BTL (IV SOLUTION) ×2 IMPLANT
PACK BASIN DAY SURGERY FS (CUSTOM PROCEDURE TRAY) ×2 IMPLANT
PAD CAST 3X4 CTTN HI CHSV (CAST SUPPLIES) IMPLANT
PADDING CAST ABS 3INX4YD NS (CAST SUPPLIES)
PADDING CAST ABS 4INX4YD NS (CAST SUPPLIES) ×1
PADDING CAST ABS COTTON 3X4 (CAST SUPPLIES) IMPLANT
PADDING CAST ABS COTTON 4X4 ST (CAST SUPPLIES) ×1 IMPLANT
PADDING CAST COTTON 3X4 STRL (CAST SUPPLIES)
SPLINT PLASTER CAST XFAST 3X15 (CAST SUPPLIES) IMPLANT
SPLINT PLASTER XTRA FASTSET 3X (CAST SUPPLIES)
SPONGE GAUZE 4X4 12PLY (GAUZE/BANDAGES/DRESSINGS) ×2 IMPLANT
STOCKINETTE 4X48 STRL (DRAPES) ×2 IMPLANT
SUT VIC AB 4-0 P2 18 (SUTURE) IMPLANT
SUT VICRYL RAPID 5 0 P 3 (SUTURE) IMPLANT
SUT VICRYL RAPIDE 4/0 PS 2 (SUTURE) ×2 IMPLANT
SYR BULB 3OZ (MISCELLANEOUS) ×2 IMPLANT
SYR CONTROL 10ML LL (SYRINGE) IMPLANT
TOWEL OR 17X24 6PK STRL BLUE (TOWEL DISPOSABLE) ×4 IMPLANT
UNDERPAD 30X30 INCONTINENT (UNDERPADS AND DIAPERS) ×2 IMPLANT
WATER STERILE IRR 1000ML POUR (IV SOLUTION) ×2 IMPLANT

## 2011-03-02 NOTE — Anesthesia Postprocedure Evaluation (Signed)
Anesthesia Post Note  Patient: [name] [name]  Procedure(s) Performed:  EXCISION MASS - Excision Cyst Interphangeal Right Thumb  Anesthesia type: General  Patient location: PACU  Post pain: Pain level controlled  Post assessment: Patient's Cardiovascular Status Stable  Last Vitals:  Filed Vitals:   03/02/11 1310  BP: 134/80  Pulse: 71  Temp: 36.6 C  Resp: 18    Post vital signs: Reviewed and stable  Level of consciousness: alert  Complications: No apparent anesthesia complications

## 2011-03-02 NOTE — Anesthesia Preprocedure Evaluation (Signed)
Anesthesia Evaluation  Patient identified by MRN, date of birth, ID band Patient awake    Reviewed: Allergy & Precautions, H&P , NPO status , Patient's Chart, lab work & pertinent test results, reviewed documented beta blocker date and time   Airway Mallampati: II TM Distance: >3 FB Neck ROM: full    Dental   Pulmonary neg pulmonary ROS,          Cardiovascular hypertension, On Medications     Neuro/Psych PSYCHIATRIC DISORDERS Negative Neurological ROS     GI/Hepatic negative GI ROS, Neg liver ROS,   Endo/Other  Diabetes mellitus-, Well Controlled  Renal/GU negative Renal ROS  Genitourinary negative   Musculoskeletal   Abdominal   Peds  Hematology negative hematology ROS (+)   Anesthesia Other Findings See surgeon's H&P   Reproductive/Obstetrics negative OB ROS                           Anesthesia Physical Anesthesia Plan  ASA: II  Anesthesia Plan: MAC and Bier Block   Post-op Pain Management:    Induction: Intravenous  Airway Management Planned: Simple Face Mask  Additional Equipment:   Intra-op Plan:   Post-operative Plan:   Informed Consent: I have reviewed the patients History and Physical, chart, labs and discussed the procedure including the risks, benefits and alternatives for the proposed anesthesia with the patient or authorized representative who has indicated his/her understanding and acceptance.     Plan Discussed with: CRNA and Surgeon  Anesthesia Plan Comments:         Anesthesia Quick Evaluation

## 2011-03-02 NOTE — Transfer of Care (Signed)
Immediate Anesthesia Transfer of Care Note  Patient: [name] [name]  Procedure(s) Performed:  EXCISION MASS - Excision Cyst Interphangeal Right Thumb  Patient Location: PACU  Anesthesia Type: Bier block  Level of Consciousness: awake, alert , oriented and patient cooperative  Airway & Oxygen Therapy: Patient Spontanous Breathing and Patient connected to face mask oxygen  Post-op Assessment: Report given to PACU RN and Post -op Vital signs reviewed and stable  Post vital signs: Reviewed and stable Filed Vitals:   03/02/11 1030  BP: 130/85  Pulse: 72  Temp: 36.9 C  Resp: 16    Complications: No apparent anesthesia complications

## 2011-03-02 NOTE — Brief Op Note (Signed)
03/02/2011  12:19 PM  PATIENT:  [name] [name]  56 y.o. female  PRE-OPERATIVE DIAGNOSIS:  Mucoid tumor right thumb  POST-OPERATIVE DIAGNOSIS:  Mucoid tumor right thumb  PROCEDURE:  Procedure(s): EXCISION MASS  SURGEON:  Surgeon(s): Nicki Reaper, MD Tami Ribas  PHYSICIAN ASSISTANT:   ASSISTANTS: Karlyn Agee MD   ANESTHESIA:   local and regional  EBL:  Total I/O In: 700 [I.V.:700] Out: -   BLOOD ADMINISTERED:none  DRAINS: none   LOCAL MEDICATIONS USED:  MARCAINE 5CC  SPECIMEN:  Excision  DISPOSITION OF SPECIMEN:  PATHOLOGY  COUNTS:  YES  TOURNIQUET:   Total Tourniquet Time Documented: Forearm (Right) - 19 minutes  DICTATION: .Other Dictation: Dictation Number [id number]  PLAN OF CARE: Discharge to home after PACU  PATIENT DISPOSITION:  PACU - hemodynamically stable.

## 2011-03-02 NOTE — Anesthesia Procedure Notes (Signed)
Procedure Name: MAC Date/Time: 03/02/2011 11:54 AM Performed by: Gladys Damme Pre-anesthesia Checklist: Patient identified, Timeout performed, Emergency Drugs available, Suction available and Patient being monitored Patient Re-evaluated:Patient Re-evaluated prior to inductionOxygen Delivery Method: Simple face mask Placement Confirmation: CO2 detector

## 2011-03-02 NOTE — Op Note (Signed)
dictated number: [id number]

## 2011-03-03 ENCOUNTER — Encounter (HOSPITAL_BASED_OUTPATIENT_CLINIC_OR_DEPARTMENT_OTHER): Payer: Self-pay | Admitting: Orthopedic Surgery

## 2011-03-03 NOTE — Op Note (Signed)
NAMEALTHA, SWEITZER              ACCOUNT NO.:  [id number]  MEDICAL RECORD NO.:  [id number]  LOCATION:                                 FACILITY:  PHYSICIAN:  Cindee Salt, M.D.       DATE OF BIRTH:  05-01-1955  DATE OF PROCEDURE:  03/02/2011 DATE OF DISCHARGE:                              OPERATIVE REPORT   PREOPERATIVE DIAGNOSIS:  Mucoid tumor, interphalangeal joint, right thumb.  POSTOPERATIVE DIAGNOSIS:  Mucoid tumor, interphalangeal joint, right thumb.  OPERATION:  Excision of cyst, debridement of interphalangeal joint, right thumb.  SURGEON: Cindee Salt, MD  ASSISTANT:  Betha Loa, MD  ANESTHESIA:  Forearm-based IV regional with metacarpal block.  ANESTHESIOLOGIST:  Janetta Hora. Gelene Mink, MD  HISTORY:  The patient is a 56 year old female with a history of mucoid cyst.  She is desirous having this removed.  Pre, peri, and postoperative course have been discussed along with risks and complications.  She is aware that there is no guarantee with the surgery, possibility of infection, recurrence, injury to arteries, nerves, tendons, incomplete relief of symptoms, and dystrophy.  In the preoperative area, the patient is seen, the extremity marked by both the patient and surgeon.  Antibiotic given.  PROCEDURE:  The patient brought to the operating room where a forearm- based IV regional anesthetic was carried out without difficulty.  She was prepped using ChloraPrep, supine position with the right arm free. A 3-minute dry time was allowed.  Time-out taken, confirming the patient and procedure.  A curvilinear incision was made proximal to the mass, carried down through subcutaneous tissue.  Bleeders were electrocauterized with bipolar beneath the skin.  The cyst was followed distally.  With blunt and sharp dissection, this was removed, sent to pathology.  The joint was then opened on its radial aspect.  A large osteophyte was present proximally.  This was debrided with a  rongeur along with a synovectomy of the joint.  The wound was irrigated.  The skin was closed with interrupted 5-0 Vicryl repeat sutures.  A metacarpal block was then given with 0.25% Marcaine without epinephrine, 5 mL was used.  A sterile compressive dressing, splint to the finger was applied.  On deflation of the tourniquet, the remaining fingers turned pink.  She was taken to the recovery room for observation in satisfactory condition.          ______________________________ Cindee Salt, M.D.     GK/MEDQ  D:  03/02/2011  T:  03/03/2011  Job:  [id number]

## 2011-05-21 ENCOUNTER — Other Ambulatory Visit: Payer: Self-pay | Admitting: Family Medicine

## 2011-05-21 DIAGNOSIS — Z1231 Encounter for screening mammogram for malignant neoplasm of breast: Secondary | ICD-10-CM

## 2011-05-31 ENCOUNTER — Ambulatory Visit
Admission: RE | Admit: 2011-05-31 | Discharge: 2011-05-31 | Disposition: A | Discharge status: Home / Self Care | Payer: BC Managed Care – PPO | Source: Ambulatory Visit | Attending: Family Medicine | Admitting: Family Medicine

## 2011-05-31 DIAGNOSIS — M8580 Other specified disorders of bone density and structure, unspecified site: Secondary | ICD-10-CM

## 2011-05-31 DIAGNOSIS — Z1231 Encounter for screening mammogram for malignant neoplasm of breast: Secondary | ICD-10-CM

## 2011-06-29 ENCOUNTER — Telehealth: Payer: Self-pay | Admitting: *Deleted

## 2011-06-29 NOTE — Telephone Encounter (Signed)
Prior to discussing alternatives need to know which side effects worry her- needs OV to discuss

## 2011-06-29 NOTE — Telephone Encounter (Signed)
Called pt to advise her recent bone density test was resulted by MD Beverely Low and she advises that the pt has Osteoporosis, would recommend fosamax 75mg  weekly, pt declines Foasamax per side effects and wants to know if there are any other alternatives to taking this medication?

## 2011-06-30 NOTE — Telephone Encounter (Signed)
Spoke to pt to advise results/instructions. Pt understood pt accepted upcoming OV to discuss on 07-05-11 at 9:30am, MD Beverely Low made aware verbally

## 2011-07-02 ENCOUNTER — Encounter: Payer: Self-pay | Admitting: Family Medicine

## 2011-07-05 ENCOUNTER — Encounter: Payer: Self-pay | Admitting: Family Medicine

## 2011-07-05 ENCOUNTER — Ambulatory Visit (INDEPENDENT_AMBULATORY_CARE_PROVIDER_SITE_OTHER): Payer: BC Managed Care – PPO | Admitting: Family Medicine

## 2011-07-05 VITALS — BP 125/75 | HR 78 | Temp 98.3°F | Ht 66.75 in | Wt 198.0 lb

## 2011-07-05 DIAGNOSIS — M8580 Other specified disorders of bone density and structure, unspecified site: Secondary | ICD-10-CM

## 2011-07-05 DIAGNOSIS — E785 Hyperlipidemia, unspecified: Secondary | ICD-10-CM

## 2011-07-05 DIAGNOSIS — I10 Essential (primary) hypertension: Secondary | ICD-10-CM

## 2011-07-05 DIAGNOSIS — M899 Disorder of bone, unspecified: Secondary | ICD-10-CM

## 2011-07-05 DIAGNOSIS — Z131 Encounter for screening for diabetes mellitus: Secondary | ICD-10-CM

## 2011-07-05 LAB — HEPATIC FUNCTION PANEL
ALT: 24 U/L (ref 0–35)
Alkaline Phosphatase: 73 U/L (ref 39–117)
Bilirubin, Direct: 0.1 mg/dL (ref 0.0–0.3)
Total Bilirubin: 0.6 mg/dL (ref 0.3–1.2)

## 2011-07-05 LAB — BASIC METABOLIC PANEL
CO2: 30 mEq/L (ref 19–32)
Calcium: 9.7 mg/dL (ref 8.4–10.5)
Chloride: 101 mEq/L (ref 96–112)
Creatinine, Ser: 0.7 mg/dL (ref 0.4–1.2)
Sodium: 140 mEq/L (ref 135–145)

## 2011-07-05 LAB — LIPID PANEL
HDL: 62.6 mg/dL (ref >39.00)
LDL Cholesterol: 96 mg/dL (ref 0–99)
Total CHOL/HDL Ratio: 3
Triglycerides: 121 mg/dL (ref 0.0–149.0)

## 2011-07-05 LAB — HEMOGLOBIN A1C: Hgb A1c MFr Bld: 6.8 % — ABNORMAL HIGH (ref 4.6–6.5)

## 2011-07-05 MED ORDER — ALENDRONATE SODIUM 70 MG PO TABS: 70.0000 mg | ORAL_TABLET | ORAL | Status: DC

## 2011-07-05 MED ORDER — AMLODIPINE BESYLATE 5 MG PO TABS: 5.0000 mg | ORAL_TABLET | Freq: Every day | ORAL | Status: DC

## 2011-07-05 NOTE — Patient Instructions (Signed)
Schedule your complete physical for mid August We'll notify you of your lab results and make any changes if needed Start the Fosamax weekly- whichever day works for you Call with any questions or concerns Hang in there!  You're stronger than you think!!!

## 2011-07-05 NOTE — Assessment & Plan Note (Signed)
Chronic problem.  Pt's labs were excellent in December but has since stopped Lipitor due to myalgias.  Will check labs and determine next steps.

## 2011-07-05 NOTE — Assessment & Plan Note (Signed)
New.  Pt's most recent DEXA shows Tscore of -3.  Discussed oral bisphosphonate vs Prolia vs Reclast.  Pt to try oral Fosamax.  Will follow.

## 2011-07-05 NOTE — Assessment & Plan Note (Signed)
Chronic problem, diet controlled.  Due for labs.  Pt upset about her weight but has been under considerable stress w/ mom's prolonged hospitalization.  Will determine if meds are needed and encouraged pt to exercise when able, eat sensibly but to otherwise cut herself some slack.  Will follow closely.

## 2011-07-05 NOTE — Progress Notes (Signed)
  Subjective:    Patient ID: MAKENAH KARAS, female    DOB: 27-Jun-1955, 56 y.o.   MRN: [id number]  HPI DM- chronic problem, diet controlled.  Not currently on meds.  Due for labs.  Denies CP, SOB, HAs, visual changes, N/V/D, edema.  Hyperlipidemia- chronic problem, stopped Lipitor due to myalgias.  Due for labs.  Osteoporosis- recent DEXA showed T score of -3.  Wants to discuss med options and potential side effects.  HTN- chronic problem, well controlled on Norvasc, asymptomatic.   Review of Systems For ROS see HPI     Objective:   Physical Exam  Vitals reviewed. Constitutional: She is oriented to person, place, and time. She appears well-developed and well-nourished. No distress.  HENT:  Head: Normocephalic and atraumatic.  Eyes: Conjunctivae and EOM are normal. Pupils are equal, round, and reactive to light.  Neck: Normal range of motion. Neck supple. No thyromegaly present.  Cardiovascular: Normal rate, regular rhythm, normal heart sounds and intact distal pulses.   No murmur heard. Pulmonary/Chest: Effort normal and breath sounds normal. No respiratory distress.  Abdominal: Soft. She exhibits no distension. There is no tenderness.  Musculoskeletal: She exhibits no edema.  Lymphadenopathy:    She has no cervical adenopathy.  Neurological: She is alert and oriented to person, place, and time.  Skin: Skin is warm and dry.  Psychiatric: She has a normal mood and affect. Her behavior is normal.          Assessment & Plan:

## 2011-07-05 NOTE — Assessment & Plan Note (Signed)
Chronic problem.  Well controlled.  Asymptomatic.  No changes. 

## 2011-10-01 ENCOUNTER — Ambulatory Visit (INDEPENDENT_AMBULATORY_CARE_PROVIDER_SITE_OTHER): Payer: BC Managed Care – PPO | Admitting: Family Medicine

## 2011-10-01 ENCOUNTER — Encounter: Payer: Self-pay | Admitting: Family Medicine

## 2011-10-01 VITALS — BP 118/72 | HR 89 | Temp 98.1°F | Ht 67.5 in | Wt 197.2 lb

## 2011-10-01 DIAGNOSIS — Z0000 Encounter for general adult medical examination without abnormal findings: Secondary | ICD-10-CM

## 2011-10-01 DIAGNOSIS — E049 Nontoxic goiter, unspecified: Secondary | ICD-10-CM

## 2011-10-01 DIAGNOSIS — E010 Iodine-deficiency related diffuse (endemic) goiter: Secondary | ICD-10-CM

## 2011-10-01 DIAGNOSIS — Z131 Encounter for screening for diabetes mellitus: Secondary | ICD-10-CM

## 2011-10-01 DIAGNOSIS — M810 Age-related osteoporosis without current pathological fracture: Secondary | ICD-10-CM

## 2011-10-01 DIAGNOSIS — E785 Hyperlipidemia, unspecified: Secondary | ICD-10-CM

## 2011-10-01 DIAGNOSIS — I10 Essential (primary) hypertension: Secondary | ICD-10-CM

## 2011-10-01 LAB — LIPID PANEL
Cholesterol: 181 mg/dL (ref 0–200)
LDL Cholesterol: 109 mg/dL — ABNORMAL HIGH (ref 0–99)
Total CHOL/HDL Ratio: 3
VLDL: 15.8 mg/dL (ref 0.0–40.0)

## 2011-10-01 LAB — CBC WITH DIFFERENTIAL/PLATELET
Eosinophils Relative: 2.7 % (ref 0.0–5.0)
Lymphocytes Relative: 26.2 % (ref 12.0–46.0)
Monocytes Absolute: 0.5 10*3/uL (ref 0.1–1.0)
Monocytes Relative: 7.4 % (ref 3.0–12.0)
Neutrophils Relative %: 63 % (ref 43.0–77.0)
Platelets: 246 10*3/uL (ref 150.0–400.0)
RBC: 5.11 Mil/uL (ref 3.87–5.11)
WBC: 7.3 10*3/uL (ref 4.5–10.5)

## 2011-10-01 LAB — HEPATIC FUNCTION PANEL
ALT: 28 U/L (ref 0–35)
AST: 24 U/L (ref 0–37)
Alkaline Phosphatase: 55 U/L (ref 39–117)
Bilirubin, Direct: 0 mg/dL (ref 0.0–0.3)
Total Bilirubin: 0.7 mg/dL (ref 0.3–1.2)

## 2011-10-01 LAB — BASIC METABOLIC PANEL
BUN: 21 mg/dL (ref 6–23)
Creatinine, Ser: 0.7 mg/dL (ref 0.4–1.2)
GFR: 96.2 mL/min (ref >60.00)

## 2011-10-01 NOTE — Progress Notes (Signed)
  Subjective:    Patient ID: [name] [name], female    DOB: 24-Sep-1955, 56 y.o.   MRN: [id number]  HPI CPE- UTD on colonoscopy (2009), no need for pap due to TAH-BSO.  UTD on mammo and DEXA.   Review of Systems Patient reports no vision/ hearing changes, adenopathy,fever, weight change,  persistant/recurrent hoarseness , swallowing issues, chest pain, palpitations, edema, persistant/recurrent cough, hemoptysis, dyspnea (rest/exertional/paroxysmal nocturnal), gastrointestinal bleeding (melena, rectal bleeding), abdominal pain, significant heartburn, bowel changes, GU symptoms (dysuria, hematuria, incontinence), Gyn symptoms (abnormal  bleeding, pain),  syncope, focal weakness, memory loss, numbness & tingling, skin/hair/nail changes, abnormal bruising or bleeding, anxiety, or depression.     Objective:   Physical Exam  General Appearance:    Alert, cooperative, no distress, appears stated age  Head:    Normocephalic, without obvious abnormality, atraumatic  Eyes:    PERRL, conjunctiva/corneas clear, EOM's intact, fundi    benign, both eyes  Ears:    Normal TM's and external ear canals, both ears  Nose:   Nares normal, septum midline, mucosa normal, no drainage    or sinus tenderness  Throat:   Lips, mucosa, and tongue normal; teeth and gums normal  Neck:   Supple, symmetrical, trachea midline, no adenopathy;    Thyroid: goiter present  Back:     Symmetric, no curvature, ROM normal, no CVA tenderness  Lungs:     Clear to auscultation bilaterally, respirations unlabored  Chest Wall:    No tenderness or deformity   Heart:    Regular rate and rhythm, S1 and S2 normal, no murmur, rub   or gallop  Breast Exam:    No tenderness, masses, or nipple abnormality  Abdomen:     Soft, non-tender, bowel sounds active all four quadrants,    no masses, no organomegaly  Genitalia:    deferred  Rectal:    Extremities:   Extremities normal, atraumatic, no cyanosis or edema  Pulses:   2+ and symmetric  all extremities  Skin:   Skin color, texture, turgor normal, no rashes or lesions  Lymph nodes:   Cervical, supraclavicular, and axillary nodes normal  Neurologic:   CNII-XII intact, normal strength, sensation and reflexes    throughout          Assessment & Plan:

## 2011-10-01 NOTE — Assessment & Plan Note (Signed)
Unchanged.  Non-neoplastic goiter.  Will follow.

## 2011-10-01 NOTE — Patient Instructions (Addendum)
Follow up in 4 months for diabetes- sooner if needed We'll notify you of your lab results and make any changes if needed Try and get some regular exercise- it's a good stress outlet If you find the anxiety or depression worsening- call me! Hang in there!!!

## 2011-10-01 NOTE — Assessment & Plan Note (Signed)
Chronic problem.  Pt no longer on statin.  Check labs.  Restart meds prn.

## 2011-10-01 NOTE — Assessment & Plan Note (Signed)
Recently started Fosamax.  Check Vit D level

## 2011-10-01 NOTE — Assessment & Plan Note (Signed)
Chronic problem.  Managing w/ diet although pt admits this has been poor recently.  Check labs.  Adjust tx prn.

## 2011-10-01 NOTE — Assessment & Plan Note (Signed)
Chronic problem.  Well controlled today.  Asymptomatic.  No changes. 

## 2011-10-01 NOTE — Assessment & Plan Note (Signed)
Pt's PE WNL w/ exception of known goiter.  UTD on mammo, DEXA, colonoscopy.  Check labs.  Anticipatory guidance provided.

## 2011-10-06 LAB — VITAMIN D 1,25 DIHYDROXY: Vitamin D3 1, 25 (OH)2: 69 pg/mL

## 2011-10-12 ENCOUNTER — Other Ambulatory Visit: Payer: Self-pay | Admitting: Otolaryngology

## 2011-10-12 DIAGNOSIS — E041 Nontoxic single thyroid nodule: Secondary | ICD-10-CM

## 2011-10-13 ENCOUNTER — Other Ambulatory Visit (INDEPENDENT_AMBULATORY_CARE_PROVIDER_SITE_OTHER): Payer: BC Managed Care – PPO

## 2011-10-13 DIAGNOSIS — R6889 Other general symptoms and signs: Secondary | ICD-10-CM

## 2011-10-13 DIAGNOSIS — R7989 Other specified abnormal findings of blood chemistry: Secondary | ICD-10-CM

## 2011-10-13 LAB — T4, FREE: Free T4: 0.94 ng/dL (ref 0.60–1.60)

## 2011-10-13 NOTE — Progress Notes (Signed)
Labs only

## 2011-10-14 ENCOUNTER — Ambulatory Visit
Admission: RE | Admit: 2011-10-14 | Discharge: 2011-10-14 | Disposition: A | Discharge status: Home / Self Care | Payer: BC Managed Care – PPO | Source: Ambulatory Visit | Attending: Otolaryngology | Admitting: Otolaryngology

## 2011-10-14 DIAGNOSIS — E041 Nontoxic single thyroid nodule: Secondary | ICD-10-CM

## 2011-10-29 ENCOUNTER — Encounter (HOSPITAL_BASED_OUTPATIENT_CLINIC_OR_DEPARTMENT_OTHER): Payer: Self-pay | Admitting: *Deleted

## 2011-10-29 NOTE — Progress Notes (Signed)
Pt here for finger cyst 1/13-did well-will need new ekg-and istat

## 2011-11-01 ENCOUNTER — Encounter (HOSPITAL_BASED_OUTPATIENT_CLINIC_OR_DEPARTMENT_OTHER): Payer: Self-pay | Admitting: Anesthesiology

## 2011-11-01 ENCOUNTER — Ambulatory Visit (HOSPITAL_BASED_OUTPATIENT_CLINIC_OR_DEPARTMENT_OTHER)
Admission: RE | Admit: 2011-11-01 | Discharge: 2011-11-01 | Disposition: A | Discharge status: Home / Self Care | Payer: BC Managed Care – PPO | Source: Ambulatory Visit | Attending: Orthopedic Surgery | Admitting: Orthopedic Surgery

## 2011-11-01 ENCOUNTER — Encounter (HOSPITAL_BASED_OUTPATIENT_CLINIC_OR_DEPARTMENT_OTHER): Payer: Self-pay | Admitting: *Deleted

## 2011-11-01 ENCOUNTER — Ambulatory Visit (HOSPITAL_BASED_OUTPATIENT_CLINIC_OR_DEPARTMENT_OTHER): Payer: BC Managed Care – PPO | Admitting: Anesthesiology

## 2011-11-01 ENCOUNTER — Encounter (HOSPITAL_BASED_OUTPATIENT_CLINIC_OR_DEPARTMENT_OTHER): Payer: Self-pay | Admitting: Orthopedic Surgery

## 2011-11-01 ENCOUNTER — Encounter (HOSPITAL_BASED_OUTPATIENT_CLINIC_OR_DEPARTMENT_OTHER)
Admission: RE | Disposition: A | Discharge status: Home / Self Care | Payer: Self-pay | Source: Ambulatory Visit | Attending: Orthopedic Surgery

## 2011-11-01 DIAGNOSIS — M6740 Ganglion, unspecified site: Secondary | ICD-10-CM | POA: Insufficient documentation

## 2011-11-01 DIAGNOSIS — I10 Essential (primary) hypertension: Secondary | ICD-10-CM | POA: Insufficient documentation

## 2011-11-01 DIAGNOSIS — E119 Type 2 diabetes mellitus without complications: Secondary | ICD-10-CM | POA: Insufficient documentation

## 2011-11-01 DIAGNOSIS — M6530 Trigger finger, unspecified finger: Secondary | ICD-10-CM | POA: Insufficient documentation

## 2011-11-01 HISTORY — PX: TRIGGER FINGER RELEASE: SHX641

## 2011-11-01 HISTORY — DX: Personal history of other infectious and parasitic diseases: Z86.19

## 2011-11-01 LAB — POCT I-STAT, CHEM 8
Calcium, Ion: 1.18 mmol/L (ref 1.12–1.23)
Glucose, Bld: 133 mg/dL — ABNORMAL HIGH (ref 70–99)
HCT: 42 % (ref 36.0–46.0)
Hemoglobin: 14.3 g/dL (ref 12.0–15.0)

## 2011-11-01 SURGERY — RELEASE, A1 PULLEY, FOR TRIGGER FINGER
Anesthesia: General | Site: Hand | Laterality: Right | Wound class: Clean

## 2011-11-01 MED ORDER — CHLORHEXIDINE GLUCONATE 4 % EX LIQD: 60.0000 mL | Freq: Once | CUTANEOUS | Status: DC

## 2011-11-01 MED ORDER — BUPIVACAINE HCL (PF) 0.25 % IJ SOLN
INTRAMUSCULAR | Status: DC | PRN
  Administered 2011-11-01: 5 mL

## 2011-11-01 MED ORDER — LIDOCAINE HCL (CARDIAC) 20 MG/ML IV SOLN
INTRAVENOUS | Status: DC | PRN
  Administered 2011-11-01: 50 mg via INTRAVENOUS

## 2011-11-01 MED ORDER — LIDOCAINE HCL (PF) 0.5 % IJ SOLN
INTRAMUSCULAR | Status: DC | PRN
  Administered 2011-11-01: 35 mL via INTRAVENOUS

## 2011-11-01 MED ORDER — PENTAZOCINE-NALOXONE 50-0.5 MG PO TABS: 1.0000 | ORAL_TABLET | ORAL | Status: DC | PRN

## 2011-11-01 MED ORDER — CEFAZOLIN SODIUM-DEXTROSE 2-3 GM-% IV SOLR
2.0000 g | INTRAVENOUS | Status: AC
  Administered 2011-11-01: 2 g via INTRAVENOUS

## 2011-11-01 MED ORDER — LACTATED RINGERS IV SOLN
INTRAVENOUS | Status: DC
  Administered 2011-11-01: 13:00:00 via INTRAVENOUS

## 2011-11-01 MED ORDER — ONDANSETRON HCL 4 MG/2ML IJ SOLN: 4.0000 mg | Freq: Once | INTRAMUSCULAR | Status: DC | PRN

## 2011-11-01 MED ORDER — OXYCODONE HCL 5 MG PO TABS: 5.0000 mg | ORAL_TABLET | Freq: Once | ORAL | Status: DC | PRN

## 2011-11-01 MED ORDER — MIDAZOLAM HCL 5 MG/5ML IJ SOLN
INTRAMUSCULAR | Status: DC | PRN
  Administered 2011-11-01: 2 mg via INTRAVENOUS

## 2011-11-01 MED ORDER — FENTANYL CITRATE 0.05 MG/ML IJ SOLN
INTRAMUSCULAR | Status: DC | PRN
  Administered 2011-11-01: 50 ug via INTRAVENOUS

## 2011-11-01 MED ORDER — ONDANSETRON HCL 4 MG/2ML IJ SOLN
INTRAMUSCULAR | Status: DC | PRN
  Administered 2011-11-01: 4 mg via INTRAVENOUS

## 2011-11-01 MED ORDER — OXYCODONE HCL 5 MG/5ML PO SOLN: 5.0000 mg | Freq: Once | ORAL | Status: DC | PRN

## 2011-11-01 MED ORDER — PROPOFOL 10 MG/ML IV EMUL
INTRAVENOUS | Status: DC | PRN
  Administered 2011-11-01: 50 ug/kg/min via INTRAVENOUS

## 2011-11-01 MED ORDER — KETOROLAC TROMETHAMINE 30 MG/ML IJ SOLN
INTRAMUSCULAR | Status: DC | PRN
  Administered 2011-11-01: 30 mg via INTRAVENOUS

## 2011-11-01 MED ORDER — HYDROMORPHONE HCL PF 1 MG/ML IJ SOLN: 0.2500 mg | INTRAMUSCULAR | Status: DC | PRN

## 2011-11-01 SURGICAL SUPPLY — 32 items
BANDAGE COBAN STERILE 2 (GAUZE/BANDAGES/DRESSINGS) ×2 IMPLANT
BLADE SURG 15 STRL LF DISP TIS (BLADE) ×1 IMPLANT
BLADE SURG 15 STRL SS (BLADE) ×2
BNDG CMPR 9X4 STRL LF SNTH (GAUZE/BANDAGES/DRESSINGS) ×1
BNDG ESMARK 4X9 LF (GAUZE/BANDAGES/DRESSINGS) ×1 IMPLANT
CHLORAPREP W/TINT 26ML (MISCELLANEOUS) ×2 IMPLANT
CLOTH BEACON ORANGE TIMEOUT ST (SAFETY) ×2 IMPLANT
CORDS BIPOLAR (ELECTRODE) IMPLANT
COVER MAYO STAND STRL (DRAPES) ×2 IMPLANT
COVER TABLE BACK 60X90 (DRAPES) ×2 IMPLANT
CUFF TOURNIQUET SINGLE 18IN (TOURNIQUET CUFF) ×1 IMPLANT
DECANTER SPIKE VIAL GLASS SM (MISCELLANEOUS) IMPLANT
DRAPE EXTREMITY T 121X128X90 (DRAPE) ×2 IMPLANT
DRAPE SURG 17X23 STRL (DRAPES) ×2 IMPLANT
GAUZE XEROFORM 1X8 LF (GAUZE/BANDAGES/DRESSINGS) ×2 IMPLANT
GLOVE BIO SURGEON STRL SZ 6.5 (GLOVE) ×2 IMPLANT
GLOVE SURG ORTHO 8.0 STRL STRW (GLOVE) ×2 IMPLANT
GOWN BRE IMP PREV XXLGXLNG (GOWN DISPOSABLE) ×2 IMPLANT
GOWN PREVENTION PLUS XLARGE (GOWN DISPOSABLE) ×2 IMPLANT
NEEDLE 27GAX1X1/2 (NEEDLE) ×1 IMPLANT
NS IRRIG 1000ML POUR BTL (IV SOLUTION) ×2 IMPLANT
PACK BASIN DAY SURGERY FS (CUSTOM PROCEDURE TRAY) ×2 IMPLANT
PADDING CAST ABS 4INX4YD NS (CAST SUPPLIES) ×1
PADDING CAST ABS COTTON 4X4 ST (CAST SUPPLIES) ×1 IMPLANT
SPONGE GAUZE 4X4 12PLY (GAUZE/BANDAGES/DRESSINGS) ×2 IMPLANT
STOCKINETTE 4X48 STRL (DRAPES) ×2 IMPLANT
SUT VICRYL RAPIDE 4/0 PS 2 (SUTURE) ×2 IMPLANT
SYR BULB 3OZ (MISCELLANEOUS) ×2 IMPLANT
SYR CONTROL 10ML LL (SYRINGE) ×1 IMPLANT
TOWEL OR 17X24 6PK STRL BLUE (TOWEL DISPOSABLE) ×4 IMPLANT
UNDERPAD 30X30 INCONTINENT (UNDERPADS AND DIAPERS) ×2 IMPLANT
WATER STERILE IRR 1000ML POUR (IV SOLUTION) ×1 IMPLANT

## 2011-11-01 NOTE — Op Note (Signed)
Dictated number: [id number]

## 2011-11-01 NOTE — Anesthesia Procedure Notes (Signed)
Procedure Name: MAC Performed by: Linsie Lupo W Pre-anesthesia Checklist: Patient identified, Timeout performed, Emergency Drugs available, Suction available and Patient being monitored Oxygen Delivery Method: Simple face mask Placement Confirmation: positive ETCO2       

## 2011-11-01 NOTE — Anesthesia Preprocedure Evaluation (Signed)
Anesthesia Evaluation  Patient identified by MRN, date of birth, ID band Patient awake    Reviewed: Allergy & Precautions, H&P , NPO status , Patient's Chart, lab work & pertinent test results  Airway Mallampati: I TM Distance: >3 FB Neck ROM: Full    Dental  (+) Teeth Intact and Dental Advisory Given   Pulmonary  breath sounds clear to auscultation        Cardiovascular hypertension, Rhythm:Regular Rate:Normal     Neuro/Psych    GI/Hepatic   Endo/Other    Renal/GU      Musculoskeletal   Abdominal   Peds  Hematology   Anesthesia Other Findings   Reproductive/Obstetrics                           Anesthesia Physical Anesthesia Plan  ASA: II  Anesthesia Plan: General   Post-op Pain Management:    Induction: Intravenous  Airway Management Planned: LMA  Additional Equipment:   Intra-op Plan:   Post-operative Plan:   Informed Consent: I have reviewed the patients History and Physical, chart, labs and discussed the procedure including the risks, benefits and alternatives for the proposed anesthesia with the patient or authorized representative who has indicated his/her understanding and acceptance.   Dental advisory given  Plan Discussed with: CRNA, Anesthesiologist and Surgeon  Anesthesia Plan Comments:         Anesthesia Quick Evaluation

## 2011-11-01 NOTE — Brief Op Note (Signed)
11/01/2011  1:55 PM  PATIENT:  [name]  56 y.o. female  PRE-OPERATIVE DIAGNOSIS:  Stenosing Tenosynovitis right thumb, possible flexor sheath cyst  POST-OPERATIVE DIAGNOSIS:  Stenosing Tenosynovitis right thumb, possible flexor sheath cyst  PROCEDURE:  Procedure(s) (LRB) with comments: RELEASE TRIGGER FINGER/A-1 PULLEY (Right) - right thumb, possible excision cyst  SURGEON:  Surgeon(s) and Role:    * Nicki Reaper, MD - Primary  PHYSICIAN ASSISTANT:   ASSISTANTS: none   ANESTHESIA:   local and regional  EBL:  Total I/O In: 700 [I.V.:700] Out: -   BLOOD ADMINISTERED:none  DRAINS: none   LOCAL MEDICATIONS USED:  MARCAINE     SPECIMEN:  No Specimen  DISPOSITION OF SPECIMEN:  N/A  COUNTS:  YES  TOURNIQUET:   Total Tourniquet Time Documented: Forearm (Right) - 22 minutes  DICTATION: .Other Dictation: Dictation Number [id number]  PLAN OF CARE: Discharge to home after PACU  PATIENT DISPOSITION:  PACU - hemodynamically stable.

## 2011-11-01 NOTE — Transfer of Care (Signed)
Immediate Anesthesia Transfer of Care Note  Patient: [name] [name]  Procedure(s) Performed: Procedure(s) (LRB) with comments: RELEASE TRIGGER FINGER/A-1 PULLEY (Right) - right thumb, possible excision cyst  Patient Location: PACU  Anesthesia Type: MAC and Regional  Level of Consciousness: awake and alert   Airway & Oxygen Therapy: Patient Spontanous Breathing and Patient connected to face mask oxygen  Post-op Assessment: Report given to PACU RN and Post -op Vital signs reviewed and stable  Post vital signs: Reviewed and stable  Complications: No apparent anesthesia complications

## 2011-11-01 NOTE — Anesthesia Postprocedure Evaluation (Signed)
  Anesthesia Post-op Note  Patient: [name] [name]  Procedure(s) Performed: Procedure(s) (LRB) with comments: RELEASE TRIGGER FINGER/A-1 PULLEY (Right) - right thumb, possible excision cyst  Patient Location: PACU  Anesthesia Type: General  Level of Consciousness: awake  Airway and Oxygen Therapy: Patient Spontanous Breathing  Post-op Pain: mild  Post-op Assessment: Post-op Vital signs reviewed  Post-op Vital Signs: stable  Complications: No apparent anesthesia complications

## 2011-11-01 NOTE — H&P (Signed)
[name] [name] is a 56 year-old right-hand dominant female referred by Dr. Emily Filbert for consultation with respect to a mass on her right thumb.  This has been present for the past six to eight months.  It was drained approximately 10 days ago.  This has recurred.  She has no history of injury, no history of diabetes or thyroid problems. She was told this was a mucoid cyst. She complains of constant, moderate, sharp pain with a feeling of swelling.  Hitting the area causes problems for her. She has been wearing a Band-Aid for protection. She had ad a mucoid cyst excision. She has developed a trigger thumb. . She continues to have catching of her right thumb and tenderness along the A-1 pulley with questionable cyst. She desires having this taken care of more permanently.   ALLERGIES:     Codeine.  MEDICATIONS:    Norvasc, multivitamins.  SURGICAL HISTORY:    Hysterectomy.  FAMILY MEDICAL HISTORY:   Positive for diabetes, high blood pressure and arthritis.  SOCIAL HISTORY:     She does not smoke or drink. She is married.  She is an Print production planner at [name]  REVIEW OF SYSTEMS:    Positive for high blood pressure, otherwise negative 14 points. [name] [name] is an 56 y.o. female.   Chief Complaint: STS rt thumb  HPI: see above  Past Medical History  Diagnosis Date  . Hypertension   . Hyperlipidemia   . Diabetes mellitus     diet controlled  . History of shingles 2012    back    Past Surgical History  Procedure Date  . Appendectomy   . Abdominal hysterectomy   . Tonsillectomy   . Oophorectomy   . Shoulder arthroscopy w/ rotator cuff repair 2007    rt  . Mass excision 03/02/2011    Procedure: EXCISION MASS;  Surgeon: Nicki Reaper, MD;  Location: [facility];  Service: Orthopedics;  Laterality: Right;  Excision Cyst Interphangeal Right Thumb    Family History  Problem Relation Age of Onset  . Hypertension Mother   . Hypertension Father   . Hypertension  Brother   . Diabetes Mother   . Breast cancer Mother 40   Social History:  reports that she has never smoked. She does not have any smokeless tobacco history on file. She reports that she does not drink alcohol or use illicit drugs.  Allergies:  Allergies  Allergen Reactions  . Codeine   . Tramadol     Medications Prior to Admission  Medication Sig Dispense Refill  . alendronate (FOSAMAX) 70 MG tablet Take 1 tablet (70 mg total) by mouth every 7 (seven) days. Take with a full glass of water on an empty stomach.  12 tablet  3  . amLODipine (NORVASC) 5 MG tablet Take 1 tablet (5 mg total) by mouth daily.  30 tablet  6  . Ascorbic Acid (VITAMIN C) 1000 MG tablet Take 1,000 mg by mouth daily.        Marland Kitchen aspirin 81 MG EC tablet Take 81 mg by mouth daily.        . calcium carbonate (CALCIUM 500) 1250 MG tablet Take 1 tablet by mouth daily.        . Cholecalciferol (VITAMIN D-3) 5000 UNITS TABS Take by mouth daily.        Marland Kitchen glucosamine-chondroitin 500-400 MG tablet Take 1 tablet by mouth daily.        Marland Kitchen  Multiple Vitamin (MULTIVITAMIN) tablet Take 1 tablet by mouth daily.        . Omega-3 Fatty Acids (FISH OIL PO) Take by mouth daily.        Marland Kitchen atorvastatin (LIPITOR) 40 MG tablet Take 1 tablet (40 mg total) by mouth daily.  30 tablet  2    Results for orders placed during the hospital encounter of 11/01/11 (from the past 48 hour(s))  POCT I-STAT, CHEM 8     Status: Abnormal   Collection Time   11/01/11 12:21 PM      Component Value Range Comment   Sodium 143  135 - 145 mEq/L    Potassium 3.8  3.5 - 5.1 mEq/L    Chloride 107  96 - 112 mEq/L    BUN 15  6 - 23 mg/dL    Creatinine, Ser 2.95  0.50 - 1.10 mg/dL    Glucose, Bld 621 (*) 70 - 99 mg/dL    Calcium, Ion 3.08  6.57 - 1.23 mmol/L    TCO2 24  0 - 100 mmol/L    Hemoglobin 14.3  12.0 - 15.0 g/dL    HCT 84.6  96.2 - 95.2 %     No results found.   Pertinent items are noted in HPI.  Blood pressure 132/84, pulse 65, temperature 98.1  F (36.7 C), temperature source Oral, resp. rate 18, height 5\' 7"  (1.702 m), weight 88.089 kg (194 lb 3.2 oz), SpO2 100.00%.  General appearance: alert, cooperative and appears stated age Head: Normocephalic, without obvious abnormality Neck: no adenopathy Resp: clear to auscultation bilaterally Cardio: regular rate and rhythm, S1, S2 normal, no murmur, click, rub or gallop GI: soft, non-tender; bowel sounds normal; no masses,  no organomegaly Extremities: extremities normal, atraumatic, no cyanosis or edema Pulses: 2+ and symmetric Skin: Skin color, texture, turgor normal. No rashes or lesions Neurologic: Alert and oriented X 3, normal strength and tone. Normal symmetric reflexes. Normal coordination and gait Incision/Wound: na  Assessment/Plan [name] [name] returns today. She continues to have catching of her right thumb and tenderness along the A-1 pulley with questionable cyst. She desires having this taken care of more permanently. We have discussed the possibility of surgical release of the A-1 pulley and excision of the cyst. The pre, peri and post op course are discussed along with risks and complications.  She is aware there is no guarantee with surgery, possibility of infection, recurrence, injury to arteries, nerves and tendons, incomplete relief of symptoms and dystrophy.  She is scheduled for excision of possible cyst, release A-1 pulley right thumb. Questions were invited and answered in detail.   [name] [name] R 11/01/2011, 1:15 PM

## 2011-11-01 NOTE — Anesthesia Postprocedure Evaluation (Signed)
  Anesthesia Post-op Note  Patient: [name] [name]  Procedure(s) Performed: Procedure(s) (LRB) with comments: RELEASE TRIGGER FINGER/A-1 PULLEY (Right) - right thumb, possible excision cyst  Patient Location: PACU  Anesthesia Type: MAC  Level of Consciousness: awake, alert , oriented and patient cooperative  Airway and Oxygen Therapy: Patient Spontanous Breathing  Post-op Pain: none  Post-op Assessment: Post-op Vital signs reviewed, Patient's Cardiovascular Status Stable, Respiratory Function Stable, Patent Airway, No signs of Nausea or vomiting and Pain level controlled  Post-op Vital Signs: stable  Complications: No apparent anesthesia complications

## 2011-11-02 ENCOUNTER — Encounter (HOSPITAL_BASED_OUTPATIENT_CLINIC_OR_DEPARTMENT_OTHER): Payer: Self-pay | Admitting: Orthopedic Surgery

## 2011-11-02 NOTE — Op Note (Signed)
[name] [name], [name] [name]             ACCOUNT NO.:  [id number]  MEDICAL RECORD NO.:  [id number]  LOCATION:                                 FACILITY:  PHYSICIAN:  Cindee Salt, M.D.            DATE OF BIRTH:  DATE OF PROCEDURE:  11/01/2011 DATE OF DISCHARGE:                              OPERATIVE REPORT   PREOPERATIVE DIAGNOSIS:  Trigger thumb, right hand with flexor sheath cyst.  POSTOPERATIVE DIAGNOSIS:  Trigger thumb, right hand with flexor sheath cyst.  OPERATION:  Release of A1 pulley, right thumb with excision of flexor sheath cyst x3.  SURGEON:  Cindee Salt, MD  ANESTHESIA:  Forearm-based IV regional with local infiltration.  ANESTHESIOLOGIST:  Sheldon Silvan, M.D.  HISTORY:  The patient is a 56 year old female with a history of triggering of her right thumb, this has not responded to conservative treatment.  She has elected to undergo surgical release.  Pre, peri, and postoperative course have been discussed along with risks and complications.  She is aware that there is no guarantee with the surgery; possibility of infection; recurrence of injury to arteries, nerves, tendons; incomplete relief of symptoms and dystrophy.  In the preoperative area, the patient is seen, the extremity marked by both the patient and surgeon, and antibiotic given.  PROCEDURE:  The patient was brought to the operating room where a forearm-based IV regional anesthetic was carried out without difficulty. She was prepped using ChloraPrep, right arm free.  A 3-minute dry time was allowed.  Time-out taken, confirming the patient and procedure.  A transverse incision was made over the A1 pulley of the right thumb, carried down through the subcutaneous tissue.  Bleeders were not cauterized and that there was minimal bleeding.  A local infiltration with 0.25% Marcaine without epinephrine was given, 5 mL was used. Neurovascular structures were protected.  The A1 pulley was released on its radial aspect.   Three small cysts were present of the A1 pulley, these were excised.  The thumb placed through a full range of motion, no further triggering was noted.  The wound was irrigated.  The skin was closed with interrupted 4-0 Vicryl Rapide sutures.  Sterile compressive dressing with the thumb free was applied.  On deflation of the tourniquet, all fingers were immediately pinked.  She was taken to the recovery room for observation in satisfactory condition.  She will be discharged, on Talwin NX.  Return in 1 week.          ______________________________ Cindee Salt, M.D.     GK/MEDQ  D:  11/01/2011  T:  11/02/2011  Job:  [id number]

## 2012-02-07 ENCOUNTER — Ambulatory Visit (INDEPENDENT_AMBULATORY_CARE_PROVIDER_SITE_OTHER): Payer: BC Managed Care – PPO | Admitting: Family Medicine

## 2012-02-07 ENCOUNTER — Encounter: Payer: Self-pay | Admitting: Family Medicine

## 2012-02-07 VITALS — BP 120/70 | HR 74 | Temp 98.2°F | Ht 67.5 in | Wt 196.6 lb

## 2012-02-07 DIAGNOSIS — E119 Type 2 diabetes mellitus without complications: Secondary | ICD-10-CM

## 2012-02-07 DIAGNOSIS — I10 Essential (primary) hypertension: Secondary | ICD-10-CM

## 2012-02-07 DIAGNOSIS — Z131 Encounter for screening for diabetes mellitus: Secondary | ICD-10-CM

## 2012-02-07 MED ORDER — AMLODIPINE BESYLATE 5 MG PO TABS: 5.0000 mg | ORAL_TABLET | Freq: Every day | ORAL | Status: DC

## 2012-02-07 NOTE — Assessment & Plan Note (Signed)
Excellent control.  Asymptomatic.  Refill provided on amlodipine

## 2012-02-07 NOTE — Patient Instructions (Signed)
Follow up in 4 months to recheck diabetes and cholesterol Keep up the good work!  You look great!!! Take it easy on yourself- forget the guilt, you're doing the best you can!!! Call with any questions or concerns Happy Bel[employer] Birthday!!! Happy Holidays!!!

## 2012-02-07 NOTE — Assessment & Plan Note (Signed)
Chronic problem, controlled w/ diet and exercise.  Asymptomatic.  No need for meds at this time.  Continue to follow closely.

## 2012-02-07 NOTE — Progress Notes (Signed)
  Subjective:    Patient ID: [name] [name], female    DOB: 18-Jan-1956, 56 y.o.   MRN: [id number]  HPI DM- chronic problem, currently diet controlled.  A1C 6.4.  Pt reports she is making a commitment to weight loss as of Jan 1.  Has been having very stressful time getting mom placed.  Denies symptomatic lows.  No CP, SOB, HAs, visual changes, edema.  UTD on eye exam.   Review of Systems For ROS see HPI     Objective:   Physical Exam  Vitals reviewed. Constitutional: She is oriented to person, place, and time. She appears well-developed and well-nourished. No distress.  HENT:  Head: Normocephalic and atraumatic.  Eyes: Conjunctivae normal and EOM are normal. Pupils are equal, round, and reactive to light.  Neck: Normal range of motion. Neck supple. Thyromegaly (R sided thyroid nodule) present.  Cardiovascular: Normal rate, regular rhythm, normal heart sounds and intact distal pulses.   No murmur heard. Pulmonary/Chest: Effort normal and breath sounds normal. No respiratory distress.  Abdominal: Soft. She exhibits no distension. There is no tenderness.  Musculoskeletal: She exhibits no edema.  Lymphadenopathy:    She has no cervical adenopathy.  Neurological: She is alert and oriented to person, place, and time.  Skin: Skin is warm and dry.  Psychiatric: She has a normal mood and affect. Her behavior is normal.          Assessment & Plan:

## 2012-03-06 ENCOUNTER — Telehealth: Payer: Self-pay | Admitting: Family Medicine

## 2012-03-06 ENCOUNTER — Encounter: Payer: Self-pay | Admitting: Internal Medicine

## 2012-03-06 ENCOUNTER — Ambulatory Visit (INDEPENDENT_AMBULATORY_CARE_PROVIDER_SITE_OTHER): Payer: BC Managed Care – PPO | Admitting: Internal Medicine

## 2012-03-06 VITALS — BP 132/82 | HR 82 | Temp 98.3°F | Wt 197.0 lb

## 2012-03-06 DIAGNOSIS — J069 Acute upper respiratory infection, unspecified: Secondary | ICD-10-CM

## 2012-03-06 MED ORDER — AZELASTINE HCL 0.1 % NA SOLN: 2.0000 | Freq: Every evening | NASAL | Status: DC | PRN

## 2012-03-06 NOTE — Progress Notes (Signed)
  Subjective:    Patient ID: [name] [name], female    DOB: 05-26-1955, 57 y.o.   MRN: [id number]  HPI Acute visit Got sick yesterday: Sore throat, hoarseness, mild sinus congestion.  Past Medical History  Diagnosis Date  . Hypertension   . Hyperlipidemia   . Diabetes mellitus     diet controlled  . History of shingles 2012    back    Past Surgical History  Procedure Date  . Appendectomy   . Abdominal hysterectomy   . Tonsillectomy   . Oophorectomy   . Shoulder arthroscopy w/ rotator cuff repair 2007    rt  . Mass excision 03/02/2011    Procedure: EXCISION MASS;  Surgeon: Nicki Reaper, MD;  Location: [facility];  Service: Orthopedics;  Laterality: Right;  Excision Cyst Interphangeal Right Thumb  . Trigger finger release 11/01/2011    Procedure: RELEASE TRIGGER FINGER/A-1 PULLEY;  Surgeon: Nicki Reaper, MD;  Location: [facility];  Service: Orthopedics;  Laterality: Right;  right thumb, possible excision cyst   .  Review of Systems Denies fever chills No nausea, vomiting, diarrhea or myalgias. Had a flu shot this season. Denies any chest congestion. No history of tobacco abuse or asthma.     Objective:   Physical Exam General -- alert, well-developed  HEENT -- TMs slt bulge B but no red or d/l, throat w/ mild redness, no d/c , face symmetric and not tender to palpation ; nose is slightly congested Lungs -- normal respiratory effort, no intercostal retractions, no accessory muscle use, and normal breath sounds.   Heart-- normal rate, regular rhythm, no murmur, and no gallop.   Psych-- Cognition and judgment appear intact. Alert and cooperative with normal attention span and concentration.  not anxious appearing and not depressed appearing.       Assessment & Plan:   URI, Symptoms consistent with URI, strep test negative. Plan: se  instructions

## 2012-03-06 NOTE — Telephone Encounter (Signed)
Patient Information:  Caller Name: Paiton  Phone: [phone]  Patient: [name] [name], [name] [name]  Gender: Female  DOB: Feb 22, 1955  Age: 57 Years  PCP: Sheliah Hatch.  Office Follow Up:  Does the office need to follow up with this patient?: No  Instructions For The Office: N/A  RN Note:  moderate sore throat pain  Symptoms  Reason For Call & Symptoms: sore throat and hoarsevoice  Reviewed Health History In EMR: Yes  Reviewed Medications In EMR: Yes  Reviewed Allergies In EMR: Yes  Reviewed Surgeries / Procedures: Yes  Date of Onset of Symptoms: 03/05/2012  Treatments Tried: mucinex, cough syrup  Treatments Tried Worked: No  Guideline(s) Used:  Sore Throat  Disposition Per Guideline:   Strep Test Only Visit Today or Tomorrow  Reason For Disposition Reached:   Sore throat is the main symptom and persists > 48 hours  Advice Given:  For Relief of Sore Throat Pain:  Sip warm chicken broth or apple juice.  Gargle warm salt water 3 times daily (1 teaspoon of salt in 8 oz or 240 ml of warm water).  Pain Medicines:  For pain relief, you can take either acetaminophen, ibuprofen, or naproxen.  Liquids:  Adequate liquid intake is important to prevent dehydration. Drink 6-8 glasses of water per day.  Call Back If:  You become worse.  Appointment Scheduled:  03/06/2012 14:45:00 Appointment Scheduled Provider:  Willow Ora  (Dr Beverely Low was unavailable)

## 2012-03-06 NOTE — Patient Instructions (Addendum)
Rest, fluids , tylenol If  cough, take Mucinex DM twice a day as needed  For congestion use astelin nasal spray at night until you feel better  Call if no better in few days Call anytime if the symptoms are severe

## 2012-05-09 ENCOUNTER — Other Ambulatory Visit: Payer: Self-pay

## 2012-05-09 DIAGNOSIS — Z1231 Encounter for screening mammogram for malignant neoplasm of breast: Secondary | ICD-10-CM

## 2012-05-31 ENCOUNTER — Ambulatory Visit
Admission: RE | Admit: 2012-05-31 | Discharge: 2012-05-31 | Disposition: A | Discharge status: Home / Self Care | Payer: BC Managed Care – PPO | Source: Ambulatory Visit

## 2012-05-31 DIAGNOSIS — Z1231 Encounter for screening mammogram for malignant neoplasm of breast: Secondary | ICD-10-CM

## 2012-06-06 ENCOUNTER — Telehealth: Payer: Self-pay | Admitting: Family Medicine

## 2012-06-06 ENCOUNTER — Ambulatory Visit (INDEPENDENT_AMBULATORY_CARE_PROVIDER_SITE_OTHER): Payer: BC Managed Care – PPO | Admitting: Family Medicine

## 2012-06-06 ENCOUNTER — Encounter: Payer: Self-pay | Admitting: Family Medicine

## 2012-06-06 VITALS — BP 140/86 | HR 104 | Temp 98.9°F | Wt 186.2 lb

## 2012-06-06 DIAGNOSIS — R112 Nausea with vomiting, unspecified: Secondary | ICD-10-CM

## 2012-06-06 DIAGNOSIS — R197 Diarrhea, unspecified: Secondary | ICD-10-CM

## 2012-06-06 MED ORDER — ONDANSETRON 8 MG PO TBDP: 8.0000 mg | ORAL_TABLET | Freq: Three times a day (TID) | ORAL | Status: DC | PRN

## 2012-06-06 MED ORDER — PROMETHAZINE HCL 50 MG/ML IJ SOLN
50.0000 mg | Freq: Once | INTRAMUSCULAR | Status: AC
  Administered 2012-06-06: 50 mg via INTRAMUSCULAR

## 2012-06-06 NOTE — Patient Instructions (Signed)

## 2012-06-06 NOTE — Telephone Encounter (Signed)
Patient Information:  Caller Name: Jetty  Phone: [phone]  Patient: [name] [name], [name] [name]  Gender: Female  DOB: May 27, 1955  Age: 57 Years  PCP: Sheliah Hatch.  Office Follow Up:  Does the office need to follow up with this patient?: No  Instructions For The Office: N/A   Symptoms  Reason For Call & Symptoms: Reports watery diarrhea, had vomiting but has stopped. Reports > 10 episodes of diarrhea/day. Patient was hesitant about scheduling appointment due to frequency of diarrhea. Scheduled last appointment available. Patient reports if she feels like she is having too much diarrhea she will call to cancel. Advised importance of evaluation of her symptoms.  Reviewed Health History In EMR: Yes  Reviewed Medications In EMR: Yes  Reviewed Allergies In EMR: Yes  Reviewed Surgeries / Procedures: Yes  Date of Onset of Symptoms: 06/03/2012  Treatments Tried: Drinking gatorade and water  Treatments Tried Worked: No  Guideline(s) Used:  Diarrhea  Disposition Per Guideline:   Go to Office Now  Reason For Disposition Reached:   Age < 60 years and has had > 15 diarrhea stools in past 24 hours  Advice Given:  N/A  Patient Will Follow Care Advice:  YES  Appointment Scheduled:  06/06/2012 16:15:00 Appointment Scheduled Provider:  Lelon Perla.

## 2012-06-06 NOTE — Progress Notes (Signed)
  Subjective:     [name] [name] is a 57 y.o. female who presents for evaluation of nonbilious vomiting several times per day, diarrhea several times per day and nausea. Symptoms have been present for 6 days. Patient denies acholic stools, blood in stool, constipation, dark urine, diarrhea 10 times per day, dysuria, heartburn, hematemesis, hematuria and melena. Patient's oral intake has been decreased for liquids and decreased for solids. Patient's urine output has been adequate. Other contacts with similar symptoms include: noone. Patient denies recent travel history. Patient has not had recent ingestion of possible contaminated food, toxic plants, or inappropriate medications/poisons.   The following portions of the patient's history were reviewed and updated as appropriate: allergies, current medications, past family history, past medical history, past social history, past surgical history and problem list.  Review of Systems Pertinent items are noted in HPI.    Objective:     BP 140/86  Pulse 104  Temp(Src) 98.9 F (37.2 C) (Oral)  Wt 186 lb 3.2 oz (84.46 kg)  BMI 28.72 kg/m2  SpO2 97% General appearance: alert, cooperative, appears stated age and mild distress Throat: lips, mucosa, and tongue normal; teeth and gums normal Abdomen: normal findings: soft, + BS and abnormal findings:  mild tenderness in the LLQ    Assessment:    Acute Gastroenteritis    Plan:    1. Discussed oral rehydration, reintroduction of solid foods, signs of dehydration. 2. Return or go to emergency department if worsening symptoms, blood or bile, signs of dehydration, diarrhea lasting longer than 5 days or any new concerns. 3. Follow up in a few days or sooner as needed. ---go to ER if symptoms persist or worsen 4 phenergan IM 5 zofran oddt

## 2012-06-07 ENCOUNTER — Ambulatory Visit: Payer: BC Managed Care – PPO | Admitting: Family Medicine

## 2012-06-07 LAB — BASIC METABOLIC PANEL
BUN: 18 mg/dL (ref 6–23)
CO2: 25 mEq/L (ref 19–32)
Chloride: 104 mEq/L (ref 96–112)
Creatinine, Ser: 0.6 mg/dL (ref 0.4–1.2)
Glucose, Bld: 161 mg/dL — ABNORMAL HIGH (ref 70–99)

## 2012-06-07 LAB — CBC WITH DIFFERENTIAL/PLATELET
Eosinophils Absolute: 0.2 10*3/uL (ref 0.0–0.7)
Lymphs Abs: 1.6 10*3/uL (ref 0.7–4.0)
MCHC: 34.6 g/dL (ref 30.0–36.0)
MCV: 83.4 fl (ref 78.0–100.0)
Monocytes Absolute: 0.8 10*3/uL (ref 0.1–1.0)
Neutrophils Relative %: 62.1 % (ref 43.0–77.0)
Platelets: 232 10*3/uL (ref 150.0–400.0)

## 2012-06-07 LAB — HEPATIC FUNCTION PANEL
Bilirubin, Direct: 0.2 mg/dL (ref 0.0–0.3)
Total Bilirubin: 1 mg/dL (ref 0.3–1.2)
Total Protein: 7.3 g/dL (ref 6.0–8.3)

## 2012-06-19 ENCOUNTER — Other Ambulatory Visit: Payer: Self-pay

## 2012-06-19 MED ORDER — METFORMIN HCL ER 500 MG PO TB24: 500.0000 mg | ORAL_TABLET | Freq: Every day | ORAL | Status: DC

## 2012-06-22 ENCOUNTER — Other Ambulatory Visit: Payer: Self-pay | Admitting: *Deleted

## 2012-06-22 MED ORDER — ALENDRONATE SODIUM 70 MG PO TABS: 70.0000 mg | ORAL_TABLET | ORAL | Status: DC

## 2012-06-22 NOTE — Telephone Encounter (Signed)
Rx sent 

## 2012-06-28 ENCOUNTER — Ambulatory Visit (INDEPENDENT_AMBULATORY_CARE_PROVIDER_SITE_OTHER): Payer: BC Managed Care – PPO | Admitting: Family Medicine

## 2012-06-28 ENCOUNTER — Encounter: Payer: Self-pay | Admitting: Family Medicine

## 2012-06-28 VITALS — BP 124/82 | HR 74 | Temp 97.9°F | Ht 66.5 in | Wt 189.6 lb

## 2012-06-28 DIAGNOSIS — E785 Hyperlipidemia, unspecified: Secondary | ICD-10-CM

## 2012-06-28 DIAGNOSIS — I10 Essential (primary) hypertension: Secondary | ICD-10-CM

## 2012-06-28 DIAGNOSIS — Z131 Encounter for screening for diabetes mellitus: Secondary | ICD-10-CM

## 2012-06-28 LAB — BASIC METABOLIC PANEL
BUN: 12 mg/dL (ref 6–23)
Chloride: 104 mEq/L (ref 96–112)
Glucose, Bld: 115 mg/dL — ABNORMAL HIGH (ref 70–99)
Potassium: 3.8 mEq/L (ref 3.5–5.1)
Sodium: 139 mEq/L (ref 135–145)

## 2012-06-28 LAB — HEMOGLOBIN A1C: Hgb A1c MFr Bld: 7.1 % — ABNORMAL HIGH (ref 4.6–6.5)

## 2012-06-28 LAB — HEPATIC FUNCTION PANEL
AST: 23 U/L (ref 0–37)
Albumin: 4.2 g/dL (ref 3.5–5.2)
Alkaline Phosphatase: 57 U/L (ref 39–117)
Bilirubin, Direct: 0 mg/dL (ref 0.0–0.3)

## 2012-06-28 LAB — LIPID PANEL
HDL: 50.3 mg/dL (ref >39.00)
LDL Cholesterol: 93 mg/dL (ref 0–99)
Total CHOL/HDL Ratio: 3
VLDL: 14.2 mg/dL (ref 0.0–40.0)

## 2012-06-28 NOTE — Assessment & Plan Note (Signed)
Chronic problem.  Diet controlled.  UTD on eye exam.  Check labs- start meds prn.

## 2012-06-28 NOTE — Assessment & Plan Note (Signed)
Chronic problem, well controlled.  Asymptomatic.  No med changes at this time.

## 2012-06-28 NOTE — Patient Instructions (Addendum)
Schedule your complete physical in August We'll notify you of your lab results and make any changes if needed Keep up the good work- you look great! Call with any questions or concerns Happy Spring!!!

## 2012-06-28 NOTE — Progress Notes (Signed)
  Subjective:    Patient ID: [name] [name], female    DOB: 07-14-1955, 57 y.o.   MRN: [id number]  HPI DM- chronic problem, diet controlled.  Sugars were elevated at last visit but pt was sick w/ noro.  Acute MD sent script for metformin- pt never picked it up.  UTD on eye exam (12/13).  Denies numbness/tingling hands or feet.  Denies symptomatic lows.  No CP, SOB, HAs, visual changes, edema.  Has started walking again.  HTN- chronic problem, well controlled on norvasc.  Asymptomatic.  Hyperlipidemia- chronic problem, diet controlled.  Exercising regularly.   Review of Systems For ROS see HPI     Objective:   Physical Exam  Vitals reviewed. Constitutional: She is oriented to person, place, and time. She appears well-developed and well-nourished. No distress.  HENT:  Head: Normocephalic and atraumatic.  Eyes: Conjunctivae and EOM are normal. Pupils are equal, round, and reactive to light.  Neck: Normal range of motion. Neck supple. No thyromegaly present.  Cardiovascular: Normal rate, regular rhythm, normal heart sounds and intact distal pulses.   No murmur heard. Pulmonary/Chest: Effort normal and breath sounds normal. No respiratory distress.  Abdominal: Soft. She exhibits no distension. There is no tenderness.  Musculoskeletal: She exhibits no edema.  Lymphadenopathy:    She has no cervical adenopathy.  Neurological: She is alert and oriented to person, place, and time.  Skin: Skin is warm and dry.  Psychiatric: She has a normal mood and affect. Her behavior is normal.          Assessment & Plan:

## 2012-06-28 NOTE — Assessment & Plan Note (Signed)
Chronic problem.  Controlled w/ diet and exercise.  Check labs.  Start statin prn.

## 2012-08-30 ENCOUNTER — Other Ambulatory Visit: Payer: Self-pay | Admitting: *Deleted

## 2012-08-30 DIAGNOSIS — I10 Essential (primary) hypertension: Secondary | ICD-10-CM

## 2012-08-30 DIAGNOSIS — M810 Age-related osteoporosis without current pathological fracture: Secondary | ICD-10-CM

## 2012-08-30 MED ORDER — ALENDRONATE SODIUM 70 MG PO TABS: 70.0000 mg | ORAL_TABLET | ORAL | Status: DC

## 2012-08-30 MED ORDER — AMLODIPINE BESYLATE 5 MG PO TABS: 5.0000 mg | ORAL_TABLET | Freq: Every day | ORAL | Status: DC

## 2012-08-30 NOTE — Telephone Encounter (Signed)
Patient Rx's have been refilled. Amlodipine and alendronate.

## 2012-10-03 ENCOUNTER — Encounter: Payer: Self-pay | Admitting: Family Medicine

## 2012-10-03 ENCOUNTER — Ambulatory Visit (INDEPENDENT_AMBULATORY_CARE_PROVIDER_SITE_OTHER): Payer: BC Managed Care – PPO | Admitting: Family Medicine

## 2012-10-03 VITALS — BP 122/78 | HR 74 | Temp 98.4°F | Ht 66.5 in | Wt 189.9 lb

## 2012-10-03 DIAGNOSIS — Z0000 Encounter for general adult medical examination without abnormal findings: Secondary | ICD-10-CM

## 2012-10-03 DIAGNOSIS — Z1331 Encounter for screening for depression: Secondary | ICD-10-CM

## 2012-10-03 DIAGNOSIS — Z131 Encounter for screening for diabetes mellitus: Secondary | ICD-10-CM

## 2012-10-03 LAB — HEPATIC FUNCTION PANEL
AST: 20 U/L (ref 0–37)
Albumin: 4.4 g/dL (ref 3.5–5.2)
Alkaline Phosphatase: 58 U/L (ref 39–117)
Bilirubin, Direct: 0 mg/dL (ref 0.0–0.3)
Total Protein: 7.2 g/dL (ref 6.0–8.3)

## 2012-10-03 LAB — CBC WITH DIFFERENTIAL/PLATELET
Basophils Absolute: 0 10*3/uL (ref 0.0–0.1)
Eosinophils Relative: 2.4 % (ref 0.0–5.0)
HCT: 43.1 % (ref 36.0–46.0)
Lymphocytes Relative: 31.6 % (ref 12.0–46.0)
Monocytes Relative: 7.6 % (ref 3.0–12.0)
Neutrophils Relative %: 57.7 % (ref 43.0–77.0)
Platelets: 233 10*3/uL (ref 150.0–400.0)
RDW: 12.3 % (ref 11.5–14.6)
WBC: 6.4 10*3/uL (ref 4.5–10.5)

## 2012-10-03 LAB — LIPID PANEL
LDL Cholesterol: 95 mg/dL (ref 0–99)
Total CHOL/HDL Ratio: 4
Triglycerides: 131 mg/dL (ref 0.0–149.0)

## 2012-10-03 LAB — BASIC METABOLIC PANEL
CO2: 27 mEq/L (ref 19–32)
Calcium: 9.4 mg/dL (ref 8.4–10.5)
Chloride: 104 mEq/L (ref 96–112)
Glucose, Bld: 160 mg/dL — ABNORMAL HIGH (ref 70–99)
Potassium: 4 mEq/L (ref 3.5–5.1)
Sodium: 137 mEq/L (ref 135–145)

## 2012-10-03 LAB — HEMOGLOBIN A1C: Hgb A1c MFr Bld: 7.2 % — ABNORMAL HIGH (ref 4.6–6.5)

## 2012-10-03 LAB — MICROALBUMIN / CREATININE URINE RATIO: Microalb, Ur: 0.8 mg/dL (ref 0.0–1.9)

## 2012-10-03 NOTE — Patient Instructions (Addendum)
Follow up in 3-4 months to recheck sugar We'll notify you of your lab results and make any changes if needed Make sure you get a yearly eye exam Call with any questions or concerns Hang in there!!!

## 2012-10-03 NOTE — Progress Notes (Signed)
  Subjective:    Patient ID: [name] [name], female    DOB: [birth year], 57 y.o.   MRN: [id number]  HPI CPE- UTD on colonoscopy, DEXA, mammo.   Review of Systems Patient reports no vision/ hearing changes, adenopathy,fever, weight change,  persistant/recurrent hoarseness , swallowing issues, chest pain, palpitations, edema, persistant/recurrent cough, hemoptysis, dyspnea (rest/exertional/paroxysmal nocturnal), gastrointestinal bleeding (melena, rectal bleeding), abdominal pain, significant heartburn, bowel changes, GU symptoms (dysuria, hematuria, incontinence), Gyn symptoms (abnormal  bleeding, pain),  syncope, focal weakness, memory loss, numbness & tingling, skin/hair/nail changes, abnormal bruising or bleeding, anxiety, or depression.     Objective:   Physical Exam General Appearance:    Alert, cooperative, no distress, appears stated age  Head:    Normocephalic, without obvious abnormality, atraumatic  Eyes:    PERRL, conjunctiva/corneas clear, EOM's intact, fundi    benign, both eyes  Ears:    Normal TM's and external ear canals, both ears  Nose:   Nares normal, septum midline, mucosa normal, no drainage    or sinus tenderness  Throat:   Lips, mucosa, and tongue normal; teeth and gums normal  Neck:   Supple, symmetrical, trachea midline, no adenopathy;    Thyroid: R dominant nodule  Back:     Symmetric, no curvature, ROM normal, no CVA tenderness  Lungs:     Clear to auscultation bilaterally, respirations unlabored  Chest Wall:    No tenderness or deformity   Heart:    Regular rate and rhythm, S1 and S2 normal, no murmur, rub   or gallop  Breast Exam:    Deferred to mammo  Abdomen:     Soft, non-tender, bowel sounds active all four quadrants,    no masses, no organomegaly  Genitalia:    Deferred  Rectal:    Extremities:   Extremities normal, atraumatic, no cyanosis or edema  Pulses:   2+ and symmetric all extremities  Skin:   Skin color, texture, turgor normal, no rashes or  lesions  Lymph nodes:   Cervical, supraclavicular, and axillary nodes normal  Neurologic:   CNII-XII intact, normal strength, sensation and reflexes    throughout          Assessment & Plan:

## 2012-10-03 NOTE — Assessment & Plan Note (Addendum)
Pt's PE WNL w/ exception of known R thyroid nodule.  UTD on colonoscopy, mammo, DEXA.  Check labs.  Anticipatory guidance provided.

## 2012-10-03 NOTE — Assessment & Plan Note (Signed)
Pt has never been on meds.  Has not lost weight since last visit.  UTD on eye exam.  Asymptomatic.  Check labs.  Stressed importance of low carb diet and regular exercise.  Will follow.

## 2012-10-04 ENCOUNTER — Other Ambulatory Visit: Payer: Self-pay | Admitting: Family Medicine

## 2012-10-04 ENCOUNTER — Ambulatory Visit: Payer: BC Managed Care – PPO

## 2012-10-04 DIAGNOSIS — R7989 Other specified abnormal findings of blood chemistry: Secondary | ICD-10-CM

## 2012-10-04 DIAGNOSIS — R6889 Other general symptoms and signs: Secondary | ICD-10-CM

## 2012-10-04 LAB — T4, FREE: Free T4: 1.32 ng/dL (ref 0.60–1.60)

## 2012-10-04 LAB — T3, FREE: T3, Free: 4.3 pg/mL — ABNORMAL HIGH (ref 2.3–4.2)

## 2012-10-06 LAB — VITAMIN D 1,25 DIHYDROXY
Vitamin D 1, 25 (OH)2 Total: 81 pg/mL — ABNORMAL HIGH (ref 18–72)
Vitamin D2 1, 25 (OH)2: <8 pg/mL

## 2012-11-07 ENCOUNTER — Encounter: Payer: Self-pay | Admitting: Internal Medicine

## 2012-11-07 ENCOUNTER — Ambulatory Visit (INDEPENDENT_AMBULATORY_CARE_PROVIDER_SITE_OTHER): Payer: BC Managed Care – PPO | Admitting: Internal Medicine

## 2012-11-07 VITALS — BP 124/80 | HR 73 | Temp 98.3°F | Resp 12 | Ht 68.0 in | Wt 194.9 lb

## 2012-11-07 DIAGNOSIS — E0590 Thyrotoxicosis, unspecified without thyrotoxic crisis or storm: Secondary | ICD-10-CM

## 2012-11-07 DIAGNOSIS — E041 Nontoxic single thyroid nodule: Secondary | ICD-10-CM

## 2012-11-07 NOTE — Progress Notes (Addendum)
Patient ID: [name] [name], female   DOB: 08-Feb-1956, 57 y.o.   MRN: [id number]   HPI  [name] [name] is a 57 y.o.-year-old female, referred by her PCP, Dr. Beverely Low, for evaluation for thyrotoxycosis.  She was told she had a thyroid nodule in 2012 by palpation at an appt with PCP >> referred to Dr Pollyann Kennedy >> thyroid U/S >> Thyroid Bx >> benign.  I reviewed pt's thyroid tests: Lab Results  Component Value Date   TSH 0.05* 10/03/2012   TSH 0.02* 10/01/2011   TSH 0.05* 07/29/2010   TSH 0.22 12/17/2009   FREET4 1.32 10/04/2012   FREET4 0.94 10/13/2011   FREET4 0.81 07/29/2010  Free T3 levels also normal.  Pt denies feeling nodules in neck, hoarseness, dysphagia/odynophagia, SOB with lying down; she c/o: - + heat intolerance - hot flushes especially at night - no tremors - no anxiety - no palpitations - no problems with concentration - no fatigue - no hyperdefecation - + weight loss 2 years ago, 30 lbs, not recently She exercises twice a week by walking.  Pt does have a FH of thyroid ds in cousins - hypothyroidism. No FH of thyroid cancer. No h/o radiation tx to head or neck.  No seaweed or kelp, no recent contrast studies. No steroid use. No herbal supplements.   I reviewed patient's chart, and she also has a history of osteoporosis, on Fosamax; and hypertension.  ROS: - see HPI Constitutional: no weight gain/loss, no fatigue, no subjective hyperthermia/hypothermia Eyes: no blurry vision, no xerophthalmia ENT: no sore throat, no nodules palpated in throat, no dysphagia/odynophagia, no hoarseness Cardiovascular: no CP/SOB/palpitations/leg swelling Respiratory: no cough/SOB Gastrointestinal: no N/V/D/C Musculoskeletal: no muscle/joint aches Skin: no rashes Neurological: no tremors/numbness/tingling/dizziness Psychiatric: no depression/anxiety  Past Medical History  Diagnosis Date  . Hypertension   . Hyperlipidemia   . Diabetes mellitus     diet controlled  . History of  shingles 2012    back   Past Surgical History  Procedure Laterality Date  . Appendectomy    . Abdominal hysterectomy    . Tonsillectomy    . Oophorectomy    . Shoulder arthroscopy w/ rotator cuff repair  2007    rt  . Mass excision  03/02/2011    Procedure: EXCISION MASS;  Surgeon: Nicki Reaper, MD;  Location: [facility];  Service: Orthopedics;  Laterality: Right;  Excision Cyst Interphangeal Right Thumb  . Trigger finger release  11/01/2011    Procedure: RELEASE TRIGGER FINGER/A-1 PULLEY;  Surgeon: Nicki Reaper, MD;  Location: [facility];  Service: Orthopedics;  Laterality: Right;  right thumb, possible excision cyst   History   Social History  . Marital Status: Married    Spouse Name: N/A    Number of Children: 0   Occupational History  . office administrator    Social History Main Topics  . Smoking status: Never Smoker   . Smokeless tobacco: Not on file  . Alcohol Use: No  . Drug Use: No  . Sexual Activity: Yes    Partners: Male   Social History Narrative   Married; step daughter   Regular exercise: walk 2 days a week   Caffeine use: 2 cups of coffee daily   Current Outpatient Prescriptions on File Prior to Visit  Medication Sig Dispense Refill  . alendronate (FOSAMAX) 70 MG tablet Take 1 tablet (70 mg total) by mouth every 7 (seven) days. Take with a full glass of water on an  empty stomach.  12 tablet  3  . amLODipine (NORVASC) 5 MG tablet Take 1 tablet (5 mg total) by mouth daily.  30 tablet  6  . Ascorbic Acid (VITAMIN C) 1000 MG tablet Take 1,000 mg by mouth daily.        [name] [name] aspirin 81 MG EC tablet Take 81 mg by mouth daily.        . calcium carbonate (CALCIUM 500) 1250 MG tablet Take 1 tablet by mouth daily.        [name] [name] glucosamine-chondroitin 500-400 MG tablet Take 1 tablet by mouth daily.        . Multiple Vitamin (MULTIVITAMIN) tablet Take 1 tablet by mouth daily.        . Omega-3 Fatty Acids (FISH OIL PO) Take by mouth daily.         . ondansetron (ZOFRAN ODT) 8 MG disintegrating tablet Take 1 tablet (8 mg total) by mouth every 8 (eight) hours as needed for nausea.  20 tablet  0   No current facility-administered medications on file prior to visit.   Allergies  Allergen Reactions  . Codeine   . Tramadol    Family History  Problem Relation Age of Onset  . Hypertension Mother   . Hypertension Father   . Hypertension Brother   . Diabetes Mother   . Breast cancer Mother 23   PE: BP 124/80  Pulse 73  Temp(Src) 98.3 F (36.8 C) (Oral)  Resp 12  Ht 5\' 8"  (1.727 m)  Wt 194 lb 14.4 oz (88.406 kg)  BMI 29.64 kg/m2  SpO2 98% Wt Readings from Last 3 Encounters:  11/07/12 194 lb 14.4 oz (88.406 kg)  10/03/12 189 lb 14.4 oz (86.138 kg)  06/28/12 189 lb 9.6 oz (86.002 kg)   Constitutional: overweight, in NAD Eyes: PERRLA, EOMI, no exophthalmos, no lid lag, no stare ENT: moist mucous membranes, + thyromegaly - L>R, no thyroid bruits, no cervical lymphadenopathy Cardiovascular: RRR, No MRG Respiratory: CTA B Gastrointestinal: abdomen soft, NT, ND, BS+ Musculoskeletal: no deformities, strength intact in all 4 Skin: moist, warm, no rashes Neurological: no tremor with outstretched hands, DTR normal in all 4  ASSESSMENT: 1. Subclinical hyperthyroidism - x 2 years  2. Large right thyroid nodule Thyroid ultrasound 10/14/2011: - thyroid gland is homogeneous in appearance - 3.7 x 2.4 x 2.8 cm (previously 3 x 2.5 x 2.2 cm). - she also has a 7 x 5 x 5 mm complex appearing cyst in the left lower pole, stable - no lymphadenopathy Thyroid biopsy 08/28/2010: right thyroid nodule: non-neoplastic goiter Thyroid ultrasound 07/31/2010: - normal echotexture of the thyroid gland - right thyroid nodule 3 x 2.5 x 2.2 cm, containing small areas of internal cystic degeneration. - 2 additional small hypoechoic nodules of 3 and 7 mm in the left lobe - no lymphadenopathy  PLAN:  1. And 2.  Patient with a 2 year h/o low TSH,  without thyrotoxic sxs.  - she does not appear to have exogenous causes for the low TSH.  - We discussed that possible causes of thyrotoxicosis are:  Graves ds   Thyroiditis (I doubt this due to the long duration of her condition) toxic multinodular goiter/ toxic adenoma (I can feel the large right-sided thyroid nodule, unclear if this is hyperactive or not - we discussed about the fact that the only test that would allow [place] to differentiate between the 3 above possible etiologies is an uptake and scan - she agrees to have this done,  so I ordered it - we'll decide about further plan when the results are back. Due to the presence of osteoporosis, and the fact that her TSH is persistently lower than 0.1 for the last 2 years, I would be inclined to treat her - we discussed about possible modalities of treatment for the above conditions, to include methimazole use, radioactive iodine ablation or surgery. - we also discussed about the fact that her thyroid nodule can be either "hot" or "cold". If this is hyperactive, the chances of cancer are minimal, and there is no indication for rebiopsy or surgery, unless she starts having neck compression symptoms from this. If the nodule is cold, we might need a repeat biopsy in the future, especially since the nodule has increased in size between 2012 in 2013. The fact that she had a benign FNA of this nodule in 2012 is very reassuring. - I will send her the results to my chart - I advised her to join my chart to communicate easier - RTC in 2 months, but likely sooner for repeat labs  Orders Placed This Encounter  Procedures  . NM THYROID SNG UPTAKE W/IMAGING   11/28/2012 CLINICAL DATA: Subclinical hyperthyroidism  EXAM: THYROID SCAN AND UPTAKE - 24 HOURS  TECHNIQUE: Following the per oral administration of I-131 sodium iodide, the patient returned at 24 hours and uptake measurements were acquired with the uptake probe centered on the neck. Thyroid  imaging was performed following the intravenous administration of the Tc-32m Pertechnetate.  COMPARISON: Thyroid ultrasound 10/13/2011  RADIOPHARMACEUTICALS: 17.2 uCi I-131 Sodium Iodide and 10.7 mCi TC-42m Pertechnetate  FINDINGS: Normal 24 hr radio iodine uptake of 21%.  Images of the thyroid gland in 3 projections demonstrate a large area of increased tracer localization involving the right lobe compatible with a autonomous/hyperfunctional adenoma.  Small focus of tracer accumulation at inferior to mid left thyroid lobe question small warm nodule.  Remaining thyroid uptake appears suppressed.  IMPRESSION: Normal 24 hr radio iodine uptake of 21%.  Large hot nodule within right thyroid lobe consistent with a hyperfunctional/autonomous adenoma and corresponding to the 3.7 x 2.4 x 2.8 cm diameter mass identified on most recent thyroid ultrasound.  Question additional small warm nodule at mid to inferior pole left lobe.   Electronically Signed By: Ulyses Southward M.D. On: 11/28/2012 09:58  Msg sent to pt: Entered by Carlus Pavlov, MD at 11/28/2012 5:33 PM  Read by [name] at 11/29/2012 8:26 AM  Dear Ms Anastasia Fiedler  The report of the thyroid uptake and scan shows that your right thyroid nodule is overactive and this is the reason why your TSH continues to be low. I believe the best way to proceed for now is to have you come back to the clinic for a recheck of your thyroid tests since your last set of labs was checked 2 months ago and then proceed with a low dose of radioactive iodine treatment, to "silence" that nodule. Please let me know what you think and if you have any questions. I would place her labs in the computer say you can just come by the lab and have them drawn.  Please let me know if you have any questions.  Sincerely,  Carlus Pavlov MD   12/04/2012 Pt did not return for labs yet. I will addend the results when they become  available.  12/07/2012 Component     Latest Ref Rng 12/06/2012  TSH     0.35 - 5.50 uIU/mL 0.01 (L)  Free  T4     0.60 - 1.60 ng/dL 4.54   Will go ahead with RAI ablation of the R thyroid nodule. Will let pt know.

## 2012-11-07 NOTE — Patient Instructions (Signed)
You will be called with the Uptake and scan schedule. We will decide for further plan when we have the results.  I will send you the results through MyChart. Please return in 3 months for recheck.

## 2012-11-27 ENCOUNTER — Encounter (HOSPITAL_COMMUNITY)
Admission: RE | Admit: 2012-11-27 | Discharge: 2012-11-27 | Disposition: A | Discharge status: Home / Self Care | Payer: BC Managed Care – PPO | Source: Ambulatory Visit | Attending: Internal Medicine | Admitting: Internal Medicine

## 2012-11-27 DIAGNOSIS — E0590 Thyrotoxicosis, unspecified without thyrotoxic crisis or storm: Secondary | ICD-10-CM | POA: Insufficient documentation

## 2012-11-28 ENCOUNTER — Other Ambulatory Visit: Payer: Self-pay | Admitting: Internal Medicine

## 2012-11-28 ENCOUNTER — Encounter (HOSPITAL_COMMUNITY)
Admission: RE | Admit: 2012-11-28 | Discharge: 2012-11-28 | Disposition: A | Discharge status: Home / Self Care | Payer: BC Managed Care – PPO | Source: Ambulatory Visit | Attending: Internal Medicine | Admitting: Internal Medicine

## 2012-11-28 DIAGNOSIS — E0510 Thyrotoxicosis with toxic single thyroid nodule without thyrotoxic crisis or storm: Secondary | ICD-10-CM | POA: Insufficient documentation

## 2012-11-28 DIAGNOSIS — E0590 Thyrotoxicosis, unspecified without thyrotoxic crisis or storm: Secondary | ICD-10-CM | POA: Insufficient documentation

## 2012-11-28 MED ORDER — SODIUM PERTECHNETATE TC 99M INJECTION
10.7000 | Freq: Once | INTRAVENOUS | Status: AC | PRN
  Administered 2012-11-28: 11 via INTRAVENOUS

## 2012-11-28 MED ORDER — SODIUM IODIDE I 131 CAPSULE
17.2000 | Freq: Once | INTRAVENOUS | Status: AC | PRN
  Administered 2012-11-28: 17.2 via ORAL

## 2012-12-04 ENCOUNTER — Encounter: Payer: Self-pay | Admitting: Family Medicine

## 2012-12-06 ENCOUNTER — Other Ambulatory Visit (INDEPENDENT_AMBULATORY_CARE_PROVIDER_SITE_OTHER): Payer: BC Managed Care – PPO

## 2012-12-06 DIAGNOSIS — E0510 Thyrotoxicosis with toxic single thyroid nodule without thyrotoxic crisis or storm: Secondary | ICD-10-CM

## 2012-12-06 LAB — T4, FREE: Free T4: 1.05 ng/dL (ref 0.60–1.60)

## 2012-12-07 NOTE — Addendum Note (Signed)
Addended by: Carlus Pavlov on: 12/07/2012 09:40 AM   Modules accepted: Orders, Level of Service

## 2012-12-21 ENCOUNTER — Encounter (HOSPITAL_COMMUNITY)
Admission: RE | Admit: 2012-12-21 | Discharge: 2012-12-21 | Disposition: A | Discharge status: Home / Self Care | Payer: BC Managed Care – PPO | Source: Ambulatory Visit | Attending: Internal Medicine | Admitting: Internal Medicine

## 2012-12-21 DIAGNOSIS — E041 Nontoxic single thyroid nodule: Secondary | ICD-10-CM | POA: Insufficient documentation

## 2012-12-21 MED ORDER — SODIUM IODIDE I 131 CAPSULE
32.2000 | Freq: Once | INTRAVENOUS | Status: AC | PRN
  Administered 2012-12-21: 32.2 via ORAL

## 2013-01-08 ENCOUNTER — Encounter: Payer: Self-pay | Admitting: Family Medicine

## 2013-01-08 ENCOUNTER — Other Ambulatory Visit: Payer: Self-pay | Admitting: General Practice

## 2013-01-08 ENCOUNTER — Ambulatory Visit (INDEPENDENT_AMBULATORY_CARE_PROVIDER_SITE_OTHER): Payer: BC Managed Care – PPO | Admitting: Family Medicine

## 2013-01-08 VITALS — BP 126/78 | HR 82 | Temp 97.2°F | Resp 16 | Wt 194.0 lb

## 2013-01-08 DIAGNOSIS — E119 Type 2 diabetes mellitus without complications: Secondary | ICD-10-CM

## 2013-01-08 LAB — BASIC METABOLIC PANEL
CO2: 26 mEq/L (ref 19–32)
Calcium: 9.1 mg/dL (ref 8.4–10.5)
Chloride: 104 mEq/L (ref 96–112)
Sodium: 139 mEq/L (ref 135–145)

## 2013-01-08 LAB — HEMOGLOBIN A1C: Hgb A1c MFr Bld: 7.5 % — ABNORMAL HIGH (ref 4.6–6.5)

## 2013-01-08 MED ORDER — METFORMIN HCL 500 MG PO TABS: 500.0000 mg | ORAL_TABLET | Freq: Two times a day (BID) | ORAL | Status: DC

## 2013-01-08 NOTE — Progress Notes (Signed)
  Subjective:    Patient ID: [name] [name], female    DOB: Oct 01, 1955, 57 y.o.   MRN: [id number]  HPI Pre visit review using our clinic review tool, if applicable. No additional management support is needed unless otherwise documented below in the visit note.  DM- chronic problem, attempting to control w/ diet and exercise.  Has lost 1 lb since last visit.  Very stressed w/ both thyroid issues and seasonal concerns.  Exercising sporadically.  Denies symptomatic lows.  No CP, SOB, HAs, visual changes, edema.  Has eye exam upcoming 12/14.  No N/V/D.   Review of Systems For ROS see HPI     Objective:   Physical Exam  Vitals reviewed. Constitutional: She is oriented to person, place, and time. She appears well-developed and well-nourished. No distress.  HENT:  Head: Normocephalic and atraumatic.  Eyes: Conjunctivae and EOM are normal. Pupils are equal, round, and reactive to light.  Neck: Normal range of motion. Neck supple. Thyromegaly present.  Cardiovascular: Normal rate, regular rhythm, normal heart sounds and intact distal pulses.   No murmur heard. Pulmonary/Chest: Effort normal and breath sounds normal. No respiratory distress.  Abdominal: Soft. She exhibits no distension. There is no tenderness.  Musculoskeletal: She exhibits no edema.  Lymphadenopathy:    She has no cervical adenopathy.  Neurological: She is alert and oriented to person, place, and time.  Skin: Skin is warm and dry.  Psychiatric: She has a normal mood and affect. Her behavior is normal.          Assessment & Plan:

## 2013-01-08 NOTE — Patient Instructions (Signed)
Follow up in 3-4 months to recheck sugar We'll notify you of your lab results and make any changes if needed Keep up the good work!  You look great! Call with any questions or concerns Happy Thanksgiving and Happy Early [place] Ouch!!!

## 2013-01-08 NOTE — Assessment & Plan Note (Signed)
Chronic problem.  Has never been on meds but as of last check, A1C was climbing.  Pt has not lost weight.  Admits to no exercise and not following ADA diet.  Check labs.  Adjust meds prn.  Will follow.

## 2013-01-10 ENCOUNTER — Ambulatory Visit: Payer: BC Managed Care – PPO | Admitting: Family Medicine

## 2013-02-01 ENCOUNTER — Encounter: Payer: Self-pay | Admitting: Internal Medicine

## 2013-02-01 ENCOUNTER — Ambulatory Visit (INDEPENDENT_AMBULATORY_CARE_PROVIDER_SITE_OTHER): Payer: BC Managed Care – PPO | Admitting: Internal Medicine

## 2013-02-01 VITALS — BP 118/72 | HR 78 | Temp 98.3°F | Resp 12 | Wt 189.4 lb

## 2013-02-01 DIAGNOSIS — E0590 Thyrotoxicosis, unspecified without thyrotoxic crisis or storm: Secondary | ICD-10-CM

## 2013-02-01 DIAGNOSIS — E041 Nontoxic single thyroid nodule: Secondary | ICD-10-CM

## 2013-02-01 DIAGNOSIS — E049 Nontoxic goiter, unspecified: Secondary | ICD-10-CM

## 2013-02-01 DIAGNOSIS — E0510 Thyrotoxicosis with toxic single thyroid nodule without thyrotoxic crisis or storm: Secondary | ICD-10-CM

## 2013-02-01 DIAGNOSIS — E010 Iodine-deficiency related diffuse (endemic) goiter: Secondary | ICD-10-CM

## 2013-02-01 NOTE — Progress Notes (Signed)
Patient ID: [name] [name], female   DOB: Jun 02, 1955, 57 y.o.   MRN: [id number]   HPI  [name] [name] is a 57 y.o.-year-old female, returning for f/u for thyrotoxycosis, and a hot R thyroid nodule.  She was told she had a thyroid nodule in 2012 by palpation at an appt with PCP >> referred to Dr Pollyann Kennedy >> thyroid U/S >> Thyroid Bx >> benign.   An uptake and scan (11/28/2012) showed normal uptake at 21%, but a hot large right thyroid nodule.  Pt recently had RAI thyroid ablation (12/21/2012), with 32 mCi.   I reviewed pt's thyroid tests: Lab Results  Component Value Date   TSH 0.01* 12/06/2012   TSH 0.05* 10/03/2012   TSH 0.02* 10/01/2011   TSH 0.05* 07/29/2010   TSH 0.22 12/17/2009   FREET4 1.05 12/06/2012   FREET4 1.32 10/04/2012   FREET4 0.94 10/13/2011   FREET4 0.81 07/29/2010  Free T3 levels also normal.  Pt denies feeling nodules in neck, hoarseness, dysphagia/odynophagia, SOB with lying down; she c/o: - + heat intolerance - hot flushes especially at night - no tremors - no anxiety - no palpitations - no problems with concentration - no fatigue - no hyperdefecation She exercises twice a week by walking.  I reviewed patient's chart, and she also has a history of osteoporosis, on Fosamax; and hypertension.  She started Metformin - 500 mg bid.   PE: BP 118/72  Pulse 78  Temp(Src) 98.3 F (36.8 C) (Oral)  Resp 12  Wt 189 lb 6.4 oz (85.911 kg)  SpO2 98% Wt Readings from Last 3 Encounters:  02/01/13 189 lb 6.4 oz (85.911 kg)  01/08/13 194 lb (87.998 kg)  11/07/12 194 lb 14.4 oz (88.406 kg)   Constitutional: overweight, in NAD Eyes: PERRLA, EOMI, no exophthalmos, no lid lag, no stare ENT: moist mucous membranes, + mild thyromegaly - left nodule not palpable anyore, no thyroid bruits, no cervical lymphadenopathy Cardiovascular: RRR, No MRG Respiratory: CTA B Gastrointestinal: abdomen soft, NT, ND, BS+ Musculoskeletal: no deformities, strength intact in all 4 Skin:  moist, warm, no rashes Neurological: no tremor with outstretched hands, DTR normal in all 4  ASSESSMENT: 1. Subclinical hyperthyroidism - x 2 years  2. Large right thyroid nodule Thyroid ultrasound 10/14/2011: - thyroid gland is homogeneous in appearance - 3.7 x 2.4 x 2.8 cm (previously 3 x 2.5 x 2.2 cm). - she also has a 7 x 5 x 5 mm complex appearing cyst in the left lower pole, stable - no lymphadenopathy Thyroid biopsy 08/28/2010: right thyroid nodule: non-neoplastic goiter Thyroid ultrasound 07/31/2010: - normal echotexture of the thyroid gland - right thyroid nodule 3 x 2.5 x 2.2 cm, containing small areas of internal cystic degeneration. - 2 additional small hypoechoic nodules of 3 and 7 mm in the left lobe - no lymphadenopathy Thyroid Uptake and scan 11/28/2012: Normal 24 hr radio iodine uptake of 21%. Large hot nodule within right thyroid lobe consistent with a hyperfunctional/autonomous adenoma and corresponding to the 3.7 x 2.4 x 2.8 cm diameter mass identified on most recent thyroid Ultrasound. Question additional small warm nodule at mid to inferior pole left Lobe. RAI ablation (12/21/2012): 32 mCi  PLAN:  1. And 2.  Patient with a 2 year h/o low TSH, without thyrotoxic sxs. She was found to have a toxic large right nodule on the uptake and scan and she had RAI tx for this 1.5 mo ago. - she remains asymptomatic, but feels the right thyroid nodule is  not palpable anymore  - will check TFTs today and in 6 weeks - we discussed that we may need Levothyroxine ad discussed proper administration - RTC in 4 months for a visit, in 6 weeks for repeat labs  Office Visit on 02/01/2013  Component Date Value Range Status  . TSH 02/01/2013 0.01* 0.35 - 5.50 uIU/mL Final  . Free T4 02/01/2013 0.93  0.60 - 1.60 ng/dL Final  . T3, Free 16/11/9602 2.8  2.3 - 4.2 pg/mL Final   TSH still abnormal, but this can lag behind other thyroid hormone changes >> recheck in 6 weeks.

## 2013-02-01 NOTE — Patient Instructions (Signed)
Please stop at the lab. Please return in 6 weeks for labs.  HAPPY HOLIDAYS!

## 2013-02-02 LAB — T3, FREE: T3, Free: 2.8 pg/mL (ref 2.3–4.2)

## 2013-02-02 LAB — T4, FREE: Free T4: 0.93 ng/dL (ref 0.60–1.60)

## 2013-02-02 LAB — TSH: TSH: 0.01 u[IU]/mL — ABNORMAL LOW (ref 0.35–5.50)

## 2013-02-06 ENCOUNTER — Ambulatory Visit: Payer: BC Managed Care – PPO | Admitting: Internal Medicine

## 2013-03-15 ENCOUNTER — Other Ambulatory Visit: Payer: Self-pay | Admitting: *Deleted

## 2013-03-15 ENCOUNTER — Other Ambulatory Visit (INDEPENDENT_AMBULATORY_CARE_PROVIDER_SITE_OTHER): Payer: BC Managed Care – PPO

## 2013-03-15 DIAGNOSIS — E0590 Thyrotoxicosis, unspecified without thyrotoxic crisis or storm: Secondary | ICD-10-CM

## 2013-03-15 LAB — T4, FREE: Free T4: 0.67 ng/dL (ref 0.60–1.60)

## 2013-03-15 LAB — TSH: TSH: 1.5 u[IU]/mL (ref 0.35–5.50)

## 2013-03-15 LAB — T3, FREE: T3, Free: 3.1 pg/mL (ref 2.3–4.2)

## 2013-04-23 ENCOUNTER — Ambulatory Visit (INDEPENDENT_AMBULATORY_CARE_PROVIDER_SITE_OTHER): Payer: BC Managed Care – PPO | Admitting: Family Medicine

## 2013-04-23 ENCOUNTER — Encounter: Payer: Self-pay | Admitting: Family Medicine

## 2013-04-23 ENCOUNTER — Other Ambulatory Visit: Payer: Self-pay | Admitting: General Practice

## 2013-04-23 VITALS — BP 116/74 | HR 78 | Temp 98.2°F | Resp 16 | Wt 198.1 lb

## 2013-04-23 DIAGNOSIS — M810 Age-related osteoporosis without current pathological fracture: Secondary | ICD-10-CM

## 2013-04-23 DIAGNOSIS — Z1231 Encounter for screening mammogram for malignant neoplasm of breast: Secondary | ICD-10-CM

## 2013-04-23 DIAGNOSIS — I10 Essential (primary) hypertension: Secondary | ICD-10-CM

## 2013-04-23 DIAGNOSIS — E2839 Other primary ovarian failure: Secondary | ICD-10-CM

## 2013-04-23 DIAGNOSIS — E119 Type 2 diabetes mellitus without complications: Secondary | ICD-10-CM

## 2013-04-23 DIAGNOSIS — E785 Hyperlipidemia, unspecified: Secondary | ICD-10-CM

## 2013-04-23 LAB — HEPATIC FUNCTION PANEL
ALK PHOS: 62 U/L (ref 39–117)
ALT: 22 U/L (ref 0–35)
AST: 21 U/L (ref 0–37)
Albumin: 5 g/dL (ref 3.5–5.2)
Bilirubin, Direct: 0 mg/dL (ref 0.0–0.3)
Total Bilirubin: 0.6 mg/dL (ref 0.3–1.2)
Total Protein: 7.8 g/dL (ref 6.0–8.3)

## 2013-04-23 LAB — LIPID PANEL
Cholesterol: 206 mg/dL — ABNORMAL HIGH (ref 0–200)
HDL: 68.2 mg/dL (ref >39.00)
LDL Cholesterol: 115 mg/dL — ABNORMAL HIGH (ref 0–99)
TRIGLYCERIDES: 113 mg/dL (ref 0.0–149.0)
Total CHOL/HDL Ratio: 3
VLDL: 22.6 mg/dL (ref 0.0–40.0)

## 2013-04-23 LAB — BASIC METABOLIC PANEL
BUN: 13 mg/dL (ref 6–23)
CHLORIDE: 110 meq/L (ref 96–112)
CO2: 26 meq/L (ref 19–32)
CREATININE: 0.8 mg/dL (ref 0.4–1.2)
Calcium: 9.9 mg/dL (ref 8.4–10.5)
GFR: 82.73 mL/min (ref >60.00)
Glucose, Bld: 157 mg/dL — ABNORMAL HIGH (ref 70–99)
Potassium: 4.3 mEq/L (ref 3.5–5.1)
SODIUM: 148 meq/L — AB (ref 135–145)

## 2013-04-23 LAB — TSH: TSH: 2.66 u[IU]/mL (ref 0.35–5.50)

## 2013-04-23 LAB — HEMOGLOBIN A1C: Hgb A1c MFr Bld: 7 % — ABNORMAL HIGH (ref 4.6–6.5)

## 2013-04-23 MED ORDER — AMLODIPINE BESYLATE 5 MG PO TABS: 5.0000 mg | ORAL_TABLET | Freq: Every day | ORAL | Status: DC

## 2013-04-23 MED ORDER — ALENDRONATE SODIUM 70 MG PO TABS: 70.0000 mg | ORAL_TABLET | ORAL | Status: DC

## 2013-04-23 NOTE — Progress Notes (Signed)
   Subjective:    Patient ID: [name] [name], female    DOB: 12-Dec-1955, 58 y.o.   MRN: [id number]  HPI DM- started Metformin November 2014.  No N/V, will still have rare diarrhea.  Denies symptomatic lows.  Due for eye exam.  No weakness or numbness in hands/feet.  Has gained 10 lbs.  Hyperlipidemia- chronic problem, currently controlled w/ diet and exercise.  HTN- chronic problem, on amlodipine.  Excellent control.  Denies chest pain, SOB, HAs, visual changes, edema.  Osteoporosis- due for DEXA, needs refill on Fosamax   Review of Systems For ROS see HPI     Objective:   Physical Exam  Vitals reviewed. Constitutional: She is oriented to person, place, and time. She appears well-developed and well-nourished. No distress.  HENT:  Head: Normocephalic and atraumatic.  Eyes: Conjunctivae and EOM are normal. Pupils are equal, round, and reactive to light.  Neck: Normal range of motion. Neck supple. No thyromegaly present.  Cardiovascular: Normal rate, regular rhythm, normal heart sounds and intact distal pulses.   No murmur heard. Pulmonary/Chest: Effort normal and breath sounds normal. No respiratory distress.  Abdominal: Soft. She exhibits no distension. There is no tenderness.  Musculoskeletal: She exhibits no edema.  Lymphadenopathy:    She has no cervical adenopathy.  Neurological: She is alert and oriented to person, place, and time.  Skin: Skin is warm and dry.  Psychiatric: She has a normal mood and affect. Her behavior is normal.          Assessment & Plan:

## 2013-04-23 NOTE — Progress Notes (Signed)
Pre visit review using our clinic review tool, if applicable. No additional management support is needed unless otherwise documented below in the visit note. 

## 2013-04-23 NOTE — Patient Instructions (Signed)
[facility] of your lab results and make any changes if needed Call and schedule your eye exam We'll call you with your mammo and bone density appts Try and get regular exercise and make healthy food choices Call with any questions or concerns Happy Spring!

## 2013-04-23 NOTE — Assessment & Plan Note (Signed)
Chronic problem.  Has been attempting to control w/ diet and exercise but pt keeps gaining weight.  Not currently on meds.  Check labs.  Start meds prn.

## 2013-04-23 NOTE — Assessment & Plan Note (Signed)
DEXA ordered, fosamax refilled

## 2013-04-23 NOTE — Assessment & Plan Note (Signed)
Pt tolerating metformin w/o difficulty.  No symptomatic lows.  Not exercising or making healthy food choices.  Due for eye exam- encouraged her to schedule.  Check labs.  Adjust meds prn

## 2013-04-23 NOTE — Assessment & Plan Note (Signed)
Chronic problem. Well controlled.  Asymptomatic.  No anticipated med changes.  Will follow. 

## 2013-04-24 ENCOUNTER — Telehealth: Payer: Self-pay | Admitting: Family Medicine

## 2013-04-24 ENCOUNTER — Encounter: Payer: Self-pay | Admitting: Family Medicine

## 2013-04-24 ENCOUNTER — Encounter: Payer: Self-pay | Admitting: General Practice

## 2013-04-24 ENCOUNTER — Other Ambulatory Visit: Payer: Self-pay | Admitting: General Practice

## 2013-04-24 MED ORDER — METFORMIN HCL 500 MG PO TABS: 500.0000 mg | ORAL_TABLET | Freq: Two times a day (BID) | ORAL | Status: DC

## 2013-04-24 NOTE — Telephone Encounter (Signed)
Relevant patient education assigned to patient using Emmi. ° °

## 2013-04-26 ENCOUNTER — Telehealth: Payer: Self-pay

## 2013-04-26 NOTE — Telephone Encounter (Signed)
Relevant patient education assigned to patient using Emmi. ° °

## 2013-06-01 ENCOUNTER — Ambulatory Visit
Admission: RE | Admit: 2013-06-01 | Discharge: 2013-06-01 | Disposition: A | Discharge status: Home / Self Care | Payer: BC Managed Care – PPO | Source: Ambulatory Visit | Attending: Family Medicine | Admitting: Family Medicine

## 2013-06-01 ENCOUNTER — Ambulatory Visit (INDEPENDENT_AMBULATORY_CARE_PROVIDER_SITE_OTHER): Payer: BC Managed Care – PPO | Admitting: Internal Medicine

## 2013-06-01 ENCOUNTER — Encounter: Payer: Self-pay | Admitting: Internal Medicine

## 2013-06-01 VITALS — BP 116/68 | HR 91 | Temp 98.7°F | Resp 12 | Wt 196.0 lb

## 2013-06-01 DIAGNOSIS — E2839 Other primary ovarian failure: Secondary | ICD-10-CM

## 2013-06-01 DIAGNOSIS — Z1231 Encounter for screening mammogram for malignant neoplasm of breast: Secondary | ICD-10-CM

## 2013-06-01 DIAGNOSIS — E0590 Thyrotoxicosis, unspecified without thyrotoxic crisis or storm: Secondary | ICD-10-CM

## 2013-06-01 DIAGNOSIS — E041 Nontoxic single thyroid nodule: Secondary | ICD-10-CM

## 2013-06-01 DIAGNOSIS — E0510 Thyrotoxicosis with toxic single thyroid nodule without thyrotoxic crisis or storm: Secondary | ICD-10-CM

## 2013-06-01 LAB — T3, FREE: T3 FREE: 2.8 pg/mL (ref 2.3–4.2)

## 2013-06-01 LAB — T4, FREE: Free T4: 0.71 ng/dL (ref 0.60–1.60)

## 2013-06-01 LAB — HM DEXA SCAN

## 2013-06-01 LAB — TSH: TSH: 1.99 u[IU]/mL (ref 0.35–5.50)

## 2013-06-01 NOTE — Patient Instructions (Signed)
Please return in 6 months. ? ?Please stop at the lab. ? ? ?

## 2013-06-01 NOTE — Progress Notes (Signed)
Patient ID: [name] [name], female   DOB: [birth year], 58 y.o.   MRN: [id number]   HPI  [name] Festeratricia C Soffer is a 58 y.o.-year-old female, returning for f/u for thyrotoxycosis, and a hot R thyroid nodule. Last visit 4 mo ago.  Reviewed hx: She was told she had a thyroid nodule in 2012 by palpation at an appt with PCP >> referred to Dr Pollyann Kennedyosen >> thyroid U/S >> Thyroid Bx >> benign.   An uptake and scan (11/28/2012) showed normal uptake at 21%, but a hot large right thyroid nodule.  Pt had RAI thyroid ablation (12/21/2012), with 32 mCi. Pt had to pay 3000$ for this as she did not meet her deductible before.  I reviewed pt's thyroid tests: 2 sets of tests normal after the ablation: Lab Results  Component Value Date   TSH 2.66 04/23/2013   TSH 1.50 03/15/2013   TSH 0.01* 02/01/2013   TSH 0.01* 12/06/2012   TSH 0.05* 10/03/2012   TSH 0.02* 10/01/2011   TSH 0.05* 07/29/2010   TSH 0.22 12/17/2009   FREET4 0.67 03/15/2013   FREET4 0.93 02/01/2013   FREET4 1.05 12/06/2012   FREET4 1.32 10/04/2012   FREET4 0.94 10/13/2011   FREET4 0.81 07/29/2010  Free T3 levels also normal.  Pt denies feeling nodules in neck, hoarseness, dysphagia/odynophagia, SOB with lying down; she c/o: - + heat intolerance - hot flushes - no tremors - no anxiety - no palpitations - no problems with concentration - no fatigue - no hyperdefecation She exercises by walking.  I reviewed patient's chart, and she also has a history of osteoporosis, on Fosamax; and hypertension.  ROS: Constitutional: no weight gain/loss, no fatigue, + subjective hyperthermia Eyes: no blurry vision, no xerophthalmia ENT: no sore throat, no nodules palpated in throat, no dysphagia/odynophagia, no hoarseness Cardiovascular: no CP/SOB/palpitations/leg swelling Respiratory: no cough/SOB Gastrointestinal: no N/V/D/C Musculoskeletal: no muscle/joint aches Skin: no rashes Neurological: no tremors/numbness/tingling/dizziness  PE: BP 116/68  Pulse  91  Temp(Src) 98.7 F (37.1 C) (Oral)  Resp 12  Wt 196 lb (88.905 kg)  SpO2 98% Wt Readings from Last 3 Encounters:  06/01/13 196 lb (88.905 kg)  04/23/13 198 lb 2 oz (89.869 kg)  02/01/13 189 lb 6.4 oz (85.911 kg)   Constitutional: overweight, in NAD Eyes: PERRLA, EOMI, no exophthalmos, no lid lag, no stare ENT: moist mucous membranes, no thyromegaly, no cervical lymphadenopathy Cardiovascular: RRR, No MRG Respiratory: CTA B Gastrointestinal: abdomen soft, NT, ND, BS+ Musculoskeletal: no deformities, strength intact in all 4 Skin: moist, warm, no rashes Neurological: no tremor with outstretched hands, DTR normal in all 4  ASSESSMENT: 1. Subclinical hyperthyroidism - x 2 years  2. Large right thyroid nodule Thyroid ultrasound 10/14/2011: - thyroid gland is homogeneous in appearance - 3.7 x 2.4 x 2.8 cm (previously 3 x 2.5 x 2.2 cm). - she also has a 7 x 5 x 5 mm complex appearing cyst in the left lower pole, stable - no lymphadenopathy Thyroid biopsy 08/28/2010: right thyroid nodule: non-neoplastic goiter Thyroid ultrasound 07/31/2010: - normal echotexture of the thyroid gland - right thyroid nodule 3 x 2.5 x 2.2 cm, containing small areas of internal cystic degeneration. - 2 additional small hypoechoic nodules of 3 and 7 mm in the left lobe - no lymphadenopathy Thyroid Uptake and scan 11/28/2012: Normal 24 hr radio iodine uptake of 21%. Large hot nodule within right thyroid lobe consistent with a hyperfunctional/autonomous adenoma and corresponding to the 3.7 x 2.4 x 2.8 cm diameter mass identified  on most recent thyroid Ultrasound. Question additional small warm nodule at mid to inferior pole left Lobe. RAI ablation (12/21/2012): 32 mCi  PLAN:  1. And 2.  Patient with a 2 year h/o low TSH, without thyrotoxic sxs. She was found to have a toxic large right nodule on the uptake and scan and she had RAI tx for this in 12/2013 - she remains asymptomatic, and the right  thyroid nodule is not palpable anymore  - will check TFTs today  - we discussed that we may need Levothyroxine  - RTC in 6 months    Office Visit on 06/01/2013  Component Date Value Ref Range Status  . TSH 06/01/2013 1.99  0.35 - 5.50 uIU/mL Final  . Free T4 06/01/2013 0.71  0.60 - 1.60 ng/dL Final  . T3, Free 98/11/914704/17/2015 2.8  2.3 - 4.2 pg/mL Final   Msg sent: Dear Ms Anastasia FiedlerHelms,  Excellent thyroid labs, let's recheck them in 3 months. Sincerely, Carlus Pavlovristina Kanae Ignatowski MD

## 2013-06-11 ENCOUNTER — Encounter: Payer: Self-pay | Admitting: General Practice

## 2013-07-02 ENCOUNTER — Encounter: Payer: Self-pay | Admitting: Family Medicine

## 2013-07-02 DIAGNOSIS — M810 Age-related osteoporosis without current pathological fracture: Secondary | ICD-10-CM

## 2013-07-02 DIAGNOSIS — I10 Essential (primary) hypertension: Secondary | ICD-10-CM

## 2013-07-02 MED ORDER — ALENDRONATE SODIUM 70 MG PO TABS: 70.0000 mg | ORAL_TABLET | ORAL | Status: AC

## 2013-07-02 MED ORDER — AMLODIPINE BESYLATE 5 MG PO TABS: 5.0000 mg | ORAL_TABLET | Freq: Every day | ORAL | Status: DC

## 2013-07-02 NOTE — Telephone Encounter (Signed)
Med filled.  

## 2013-08-09 ENCOUNTER — Encounter: Payer: Self-pay | Admitting: Family Medicine

## 2013-08-09 ENCOUNTER — Other Ambulatory Visit: Payer: Self-pay | Admitting: Family Medicine

## 2013-08-09 MED ORDER — SITAGLIPTIN PHOSPHATE 100 MG PO TABS: 100.0000 mg | ORAL_TABLET | Freq: Every day | ORAL | Status: DC

## 2013-08-09 NOTE — Telephone Encounter (Signed)
Med filled.  

## 2013-08-13 ENCOUNTER — Encounter: Payer: Self-pay | Admitting: Family Medicine

## 2013-08-27 LAB — HM COLONOSCOPY: HM Colonoscopy: NORMAL

## 2013-10-04 ENCOUNTER — Encounter: Payer: BC Managed Care – PPO | Admitting: Family Medicine

## 2013-10-12 ENCOUNTER — Ambulatory Visit (INDEPENDENT_AMBULATORY_CARE_PROVIDER_SITE_OTHER): Payer: BC Managed Care – PPO | Admitting: Family Medicine

## 2013-10-12 ENCOUNTER — Encounter: Payer: Self-pay | Admitting: Family Medicine

## 2013-10-12 VITALS — BP 116/70 | HR 76 | Temp 98.0°F | Resp 16 | Ht 67.5 in | Wt 203.4 lb

## 2013-10-12 DIAGNOSIS — E119 Type 2 diabetes mellitus without complications: Secondary | ICD-10-CM

## 2013-10-12 DIAGNOSIS — Z0000 Encounter for general adult medical examination without abnormal findings: Secondary | ICD-10-CM

## 2013-10-12 LAB — BASIC METABOLIC PANEL
BUN: 17 mg/dL (ref 6–23)
CHLORIDE: 99 meq/L (ref 96–112)
CO2: 29 mEq/L (ref 19–32)
Calcium: 10.1 mg/dL (ref 8.4–10.5)
Creatinine, Ser: 0.8 mg/dL (ref 0.4–1.2)
GFR: 82.59 mL/min (ref >60.00)
GLUCOSE: 140 mg/dL — AB (ref 70–99)
POTASSIUM: 3.6 meq/L (ref 3.5–5.1)
Sodium: 138 mEq/L (ref 135–145)

## 2013-10-12 LAB — CBC WITH DIFFERENTIAL/PLATELET
BASOS ABS: 0.1 10*3/uL (ref 0.0–0.1)
Basophils Relative: 0.6 % (ref 0.0–3.0)
Eosinophils Absolute: 0.2 10*3/uL (ref 0.0–0.7)
Eosinophils Relative: 2.1 % (ref 0.0–5.0)
HEMATOCRIT: 42.7 % (ref 36.0–46.0)
HEMOGLOBIN: 14.3 g/dL (ref 12.0–15.0)
LYMPHS ABS: 1.8 10*3/uL (ref 0.7–4.0)
Lymphocytes Relative: 22.1 % (ref 12.0–46.0)
MCHC: 33.6 g/dL (ref 30.0–36.0)
MCV: 86.7 fl (ref 78.0–100.0)
MONO ABS: 0.6 10*3/uL (ref 0.1–1.0)
Monocytes Relative: 7.1 % (ref 3.0–12.0)
NEUTROS ABS: 5.6 10*3/uL (ref 1.4–7.7)
Neutrophils Relative %: 68.1 % (ref 43.0–77.0)
Platelets: 246 10*3/uL (ref 150.0–400.0)
RBC: 4.92 Mil/uL (ref 3.87–5.11)
RDW: 12.6 % (ref 11.5–15.5)
WBC: 8.3 10*3/uL (ref 4.0–10.5)

## 2013-10-12 LAB — HEPATIC FUNCTION PANEL
ALK PHOS: 52 U/L (ref 39–117)
ALT: 19 U/L (ref 0–35)
AST: 17 U/L (ref 0–37)
Albumin: 4.6 g/dL (ref 3.5–5.2)
Bilirubin, Direct: 0 mg/dL (ref 0.0–0.3)
Total Bilirubin: 0.7 mg/dL (ref 0.2–1.2)
Total Protein: 7.6 g/dL (ref 6.0–8.3)

## 2013-10-12 LAB — LIPID PANEL
CHOL/HDL RATIO: 3
Cholesterol: 201 mg/dL — ABNORMAL HIGH (ref 0–200)
HDL: 60.8 mg/dL (ref >39.00)
LDL Cholesterol: 120 mg/dL — ABNORMAL HIGH (ref 0–99)
NonHDL: 140.2
TRIGLYCERIDES: 100 mg/dL (ref 0.0–149.0)
VLDL: 20 mg/dL (ref 0.0–40.0)

## 2013-10-12 LAB — TSH: TSH: 0.78 u[IU]/mL (ref 0.35–4.50)

## 2013-10-12 LAB — MICROALBUMIN / CREATININE URINE RATIO
Creatinine,U: 42.1 mg/dL
Microalb Creat Ratio: 2.6 mg/g (ref 0.0–30.0)
Microalb, Ur: 1.1 mg/dL (ref 0.0–1.9)

## 2013-10-12 LAB — VITAMIN D 25 HYDROXY (VIT D DEFICIENCY, FRACTURES): VITD: 33.94 ng/mL (ref 30.00–100.00)

## 2013-10-12 LAB — HEMOGLOBIN A1C: Hgb A1c MFr Bld: 6.8 % — ABNORMAL HIGH (ref 4.6–6.5)

## 2013-10-12 NOTE — Assessment & Plan Note (Signed)
Pt's PE WNL.  UTD on mammo, DEXA, colonoscopy.  Check labs.  Anticipatory guidance provided.  

## 2013-10-12 NOTE — Progress Notes (Signed)
Pre visit review using our clinic review tool, if applicable. No additional management support is needed unless otherwise documented below in the visit note. 

## 2013-10-12 NOTE — Patient Instructions (Signed)
Follow up in 4 months to recheck diabetes and weight loss Try and work on healthy diet and regular exercise We'll notify you of your lab results and make any changes if needed Call with any questions or concerns Happy Labor Day!! Hang in there with mom!!

## 2013-10-12 NOTE — Assessment & Plan Note (Signed)
Chronic problem.  Due for eye exam.  Encouraged pt to schedule.  Tolerating meds w/o difficulty.  Check labs.  Adjust meds prn

## 2013-10-12 NOTE — Progress Notes (Signed)
   Subjective:    Patient ID: [name] [name], female    DOB: 10-16-1955, 58 y.o.   MRN: [id number]  HPI CPE- UTD on colonoscopy, DEXA, mammo.  No need for paps due to TAH.  Due for eye exam.   Review of Systems Patient reports no vision/ hearing changes, adenopathy,fever, weight change,  persistant/recurrent hoarseness , swallowing issues, chest pain, palpitations, edema, persistant/recurrent cough, hemoptysis, dyspnea (rest/exertional/paroxysmal nocturnal), gastrointestinal bleeding (melena, rectal bleeding), abdominal pain, significant heartburn, bowel changes, GU symptoms (dysuria, hematuria, incontinence), Gyn symptoms (abnormal  bleeding, pain),  syncope, focal weakness, memory loss, numbness & tingling, skin/hair/nail changes, abnormal bruising or bleeding, anxiety, or depression.     Objective:   Physical Exam General Appearance:    Alert, cooperative, no distress, appears stated age  Head:    Normocephalic, without obvious abnormality, atraumatic  Eyes:    PERRL, conjunctiva/corneas clear, EOM's intact, fundi    benign, both eyes  Ears:    Normal TM's and external ear canals, both ears  Nose:   Nares normal, septum midline, mucosa normal, no drainage    or sinus tenderness  Throat:   Lips, mucosa, and tongue normal; teeth and gums normal  Neck:   Supple, symmetrical, trachea midline, no adenopathy;    Thyroid: no enlargement/tenderness/nodules  Back:     Symmetric, no curvature, ROM normal, no CVA tenderness  Lungs:     Clear to auscultation bilaterally, respirations unlabored  Chest Wall:    No tenderness or deformity   Heart:    Regular rate and rhythm, S1 and S2 normal, no murmur, rub   or gallop  Breast Exam:    Deferred to GYN  Abdomen:     Soft, non-tender, bowel sounds active all four quadrants,    no masses, no organomegaly  Genitalia:    Deferred to GYN  Rectal:    Extremities:   Extremities normal, atraumatic, no cyanosis or edema  Pulses:   2+ and symmetric all  extremities  Skin:   Skin color, texture, turgor normal, no rashes or lesions  Lymph nodes:   Cervical, supraclavicular, and axillary nodes normal  Neurologic:   CNII-XII intact, normal strength, sensation and reflexes    throughout          Assessment & Plan:

## 2013-10-15 ENCOUNTER — Other Ambulatory Visit: Payer: Self-pay | Admitting: General Practice

## 2013-10-15 MED ORDER — ATORVASTATIN CALCIUM 20 MG PO TABS: 20.0000 mg | ORAL_TABLET | Freq: Every day | ORAL | Status: DC

## 2013-11-30 ENCOUNTER — Other Ambulatory Visit: Payer: Self-pay

## 2013-12-03 ENCOUNTER — Ambulatory Visit (INDEPENDENT_AMBULATORY_CARE_PROVIDER_SITE_OTHER): Payer: BC Managed Care – PPO | Admitting: Internal Medicine

## 2013-12-03 ENCOUNTER — Encounter: Payer: Self-pay | Admitting: Internal Medicine

## 2013-12-03 VITALS — BP 122/80 | HR 80 | Temp 97.8°F | Resp 12 | Wt 206.0 lb

## 2013-12-03 DIAGNOSIS — E0510 Thyrotoxicosis with toxic single thyroid nodule without thyrotoxic crisis or storm: Secondary | ICD-10-CM

## 2013-12-03 DIAGNOSIS — E041 Nontoxic single thyroid nodule: Secondary | ICD-10-CM

## 2013-12-03 DIAGNOSIS — E0590 Thyrotoxicosis, unspecified without thyrotoxic crisis or storm: Secondary | ICD-10-CM

## 2013-12-03 NOTE — Patient Instructions (Signed)
Please stop at the lab.  Please return in 1 year.  

## 2013-12-03 NOTE — Progress Notes (Signed)
Patient ID: [name] [name], female   DOB: 1955/05/26, 58 y.o.   MRN: [id number]   HPI  [name] Festeratricia C Sainsbury is a [age] y.o.-year-old female, returning for f/u for thyrotoxycosis, and a hot R thyroid nodule. Last visit 6 mo ago.  Reviewed hx: She was told she had a thyroid nodule in 2012 by palpation at an appt with PCP >> referred to Dr Pollyann Kennedyosen >> thyroid U/S >> Thyroid Bx >> benign.   An uptake and scan (11/28/2012) showed normal uptake at 21%, but a hot large right thyroid nodule.  Pt had RAI thyroid ablation (12/21/2012), with 32 mCi. Pt had to pay 3000$ for this as she did not meet her deductible before.  I reviewed pt's thyroid tests: tests normalized after the ablation: Lab Results  Component Value Date   TSH 0.78 10/12/2013   TSH 1.99 06/01/2013   TSH 2.66 04/23/2013   TSH 1.50 03/15/2013   TSH 0.01* 02/01/2013   TSH 0.01* 12/06/2012   TSH 0.05* 10/03/2012   TSH 0.02* 10/01/2011   TSH 0.05* 07/29/2010   TSH 0.22 12/17/2009   FREET4 0.71 06/01/2013   FREET4 0.67 03/15/2013   FREET4 0.93 02/01/2013   FREET4 1.05 12/06/2012   FREET4 1.32 10/04/2012   FREET4 0.94 10/13/2011   FREET4 0.81 07/29/2010  Free T3 levels also normal.  Pt denies feeling nodules in neck, hoarseness, dysphagia/odynophagia, SOB with lying down; she c/o: - + heat intolerance - hot flushes - chronic - no tremors - no anxiety - no palpitations - no problems with concentration - no fatigue - no hyperdefecation She exercises by walking.  I reviewed patient's chart, and she also has a history of osteoporosis, on Fosamax; and hypertension.  ROS: Constitutional: no weight gain/loss, no fatigue, + subjective hyperthermia Eyes: no blurry vision, no xerophthalmia ENT: no sore throat, no nodules palpated in throat, no dysphagia/odynophagia, no hoarseness Cardiovascular: no CP/SOB/palpitations/leg swelling Respiratory: no cough/SOB Gastrointestinal: no N/V/D/C Musculoskeletal: no muscle/joint aches Skin: no  rashes Neurological: no tremors/numbness/tingling/dizziness  PE: BP 122/80  Pulse 80  Temp(Src) 97.8 F (36.6 C) (Oral)  Resp 12  Wt 206 lb (93.441 kg)  SpO2 97% Body mass index is 31.77 kg/(m^2). Wt Readings from Last 3 Encounters:  12/03/13 206 lb (93.441 kg)  10/12/13 203 lb 6 oz (92.25 kg)  06/01/13 196 lb (88.905 kg)   Constitutional: overweight, in NAD Eyes: PERRLA, EOMI, no exophthalmos, no lid lag, no stare ENT: moist mucous membranes, no thyromegaly, no cervical lymphadenopathy Cardiovascular: RRR, No MRG Respiratory: CTA B Gastrointestinal: abdomen soft, NT, ND, BS+ Musculoskeletal: no deformities, strength intact in all 4 Skin: moist, warm, no rashes Neurological: no tremor with outstretched hands, DTR normal in all 4  ASSESSMENT: 1. Subclinical hyperthyroidism - x 2 years  2. Large right thyroid nodule Thyroid ultrasound 10/14/2011: - thyroid gland is homogeneous in appearance - 3.7 x 2.4 x 2.8 cm (previously 3 x 2.5 x 2.2 cm). - she also has a 7 x 5 x 5 mm complex appearing cyst in the left lower pole, stable - no lymphadenopathy Thyroid biopsy 08/28/2010: right thyroid nodule: non-neoplastic goiter Thyroid ultrasound 07/31/2010: - normal echotexture of the thyroid gland - right thyroid nodule 3 x 2.5 x 2.2 cm, containing small areas of internal cystic degeneration. - 2 additional small hypoechoic nodules of 3 and 7 mm in the left lobe - no lymphadenopathy Thyroid Uptake and scan 11/28/2012: Normal 24 hr radio iodine uptake of 21%. Large hot nodule within right thyroid lobe  consistent with a hyperfunctional/autonomous adenoma and corresponding to the 3.7 x 2.4 x 2.8 cm diameter mass identified on most recent thyroid Ultrasound. Question additional small warm nodule at mid to inferior pole left Lobe. RAI ablation (12/21/2012): 32 mCi  PLAN:  1. And 2.  Patient with a 2 year h/o low TSH, without thyrotoxic sxs. She was found to have a toxic large right  nodule on the uptake and scan and she had RAI tx for this in 12/2013 >> TFTs normalized afterwards - she remains asymptomatic, and the right thyroid nodule is not palpable anymore  - will check TFTs today  - we discussed that we may need Levothyroxine >> advised her to take the thyroid hormone every day, with water, >30 minutes before breakfast, separated by >4 hours from acid reflux medications, calcium, iron, multivitamins. - RTC in 12 months   Office Visit on 12/03/2013  Component Date Value Ref Range Status  . TSH 12/03/2013 2.35  0.35 - 4.50 uIU/mL Final  . Free T4 12/03/2013 0.73  0.60 - 1.60 ng/dL Final  . T3, Free 16/10/960410/19/2015 2.6  2.3 - 4.2 pg/mL Final   TFTs normal >> no need to start LT4. Will recheck labs at next visit.

## 2013-12-04 LAB — T3, FREE: T3, Free: 2.6 pg/mL (ref 2.3–4.2)

## 2013-12-04 LAB — TSH: TSH: 2.35 u[IU]/mL (ref 0.35–4.50)

## 2013-12-04 LAB — T4, FREE: Free T4: 0.73 ng/dL (ref 0.60–1.60)

## 2013-12-10 ENCOUNTER — Other Ambulatory Visit: Payer: Self-pay | Admitting: Family Medicine

## 2013-12-10 NOTE — Telephone Encounter (Signed)
Med filled.  

## 2013-12-13 ENCOUNTER — Ambulatory Visit (INDEPENDENT_AMBULATORY_CARE_PROVIDER_SITE_OTHER): Payer: BC Managed Care – PPO

## 2013-12-13 DIAGNOSIS — Z23 Encounter for immunization: Secondary | ICD-10-CM

## 2013-12-31 ENCOUNTER — Other Ambulatory Visit: Payer: Self-pay | Admitting: General Practice

## 2013-12-31 ENCOUNTER — Encounter: Payer: Self-pay | Admitting: Family Medicine

## 2013-12-31 DIAGNOSIS — I10 Essential (primary) hypertension: Secondary | ICD-10-CM

## 2013-12-31 MED ORDER — AMLODIPINE BESYLATE 5 MG PO TABS: 5.0000 mg | ORAL_TABLET | Freq: Every day | ORAL | Status: DC

## 2013-12-31 NOTE — Telephone Encounter (Signed)
Med filled.  

## 2014-01-17 LAB — HM DIABETES EYE EXAM

## 2014-01-22 ENCOUNTER — Encounter: Payer: Self-pay | Admitting: General Practice

## 2014-02-13 ENCOUNTER — Encounter: Payer: Self-pay | Admitting: Family Medicine

## 2014-02-13 ENCOUNTER — Ambulatory Visit (INDEPENDENT_AMBULATORY_CARE_PROVIDER_SITE_OTHER): Payer: BC Managed Care – PPO | Admitting: Family Medicine

## 2014-02-13 VITALS — BP 126/74 | HR 66 | Temp 97.6°F | Resp 14 | Wt 206.6 lb

## 2014-02-13 DIAGNOSIS — E785 Hyperlipidemia, unspecified: Secondary | ICD-10-CM

## 2014-02-13 DIAGNOSIS — E119 Type 2 diabetes mellitus without complications: Secondary | ICD-10-CM

## 2014-02-13 DIAGNOSIS — I10 Essential (primary) hypertension: Secondary | ICD-10-CM

## 2014-02-13 LAB — HEPATIC FUNCTION PANEL
ALT: 21 U/L (ref 0–35)
AST: 18 U/L (ref 0–37)
Albumin: 4.6 g/dL (ref 3.5–5.2)
Alkaline Phosphatase: 51 U/L (ref 39–117)
BILIRUBIN DIRECT: 0 mg/dL (ref 0.0–0.3)
Total Bilirubin: 0.5 mg/dL (ref 0.2–1.2)
Total Protein: 7.5 g/dL (ref 6.0–8.3)

## 2014-02-13 LAB — CBC WITH DIFFERENTIAL/PLATELET
BASOS PCT: 0.5 % (ref 0.0–3.0)
Basophils Absolute: 0 10*3/uL (ref 0.0–0.1)
EOS ABS: 0.1 10*3/uL (ref 0.0–0.7)
Eosinophils Relative: 1.6 % (ref 0.0–5.0)
HCT: 42.9 % (ref 36.0–46.0)
HEMOGLOBIN: 14.3 g/dL (ref 12.0–15.0)
LYMPHS PCT: 25.5 % (ref 12.0–46.0)
Lymphs Abs: 1.9 10*3/uL (ref 0.7–4.0)
MCHC: 33.3 g/dL (ref 30.0–36.0)
MCV: 85.8 fl (ref 78.0–100.0)
MONOS PCT: 5.1 % (ref 3.0–12.0)
Monocytes Absolute: 0.4 10*3/uL (ref 0.1–1.0)
NEUTROS ABS: 5.1 10*3/uL (ref 1.4–7.7)
Neutrophils Relative %: 67.3 % (ref 43.0–77.0)
PLATELETS: 243 10*3/uL (ref 150.0–400.0)
RBC: 5.01 Mil/uL (ref 3.87–5.11)
RDW: 12.5 % (ref 11.5–15.5)
WBC: 7.6 10*3/uL (ref 4.0–10.5)

## 2014-02-13 LAB — LIPID PANEL
Cholesterol: 203 mg/dL — ABNORMAL HIGH (ref 0–200)
HDL: 49.3 mg/dL (ref >39.00)
LDL CALC: 130 mg/dL — AB (ref 0–99)
NonHDL: 153.7
Total CHOL/HDL Ratio: 4
Triglycerides: 119 mg/dL (ref 0.0–149.0)
VLDL: 23.8 mg/dL (ref 0.0–40.0)

## 2014-02-13 LAB — HEMOGLOBIN A1C: HEMOGLOBIN A1C: 7.2 % — AB (ref 4.6–6.5)

## 2014-02-13 LAB — BASIC METABOLIC PANEL
BUN: 16 mg/dL (ref 6–23)
CHLORIDE: 105 meq/L (ref 96–112)
CO2: 24 meq/L (ref 19–32)
CREATININE: 0.8 mg/dL (ref 0.4–1.2)
Calcium: 9.1 mg/dL (ref 8.4–10.5)
GFR: 82.5 mL/min (ref >60.00)
GLUCOSE: 139 mg/dL — AB (ref 70–99)
Potassium: 4.1 mEq/L (ref 3.5–5.1)
Sodium: 138 mEq/L (ref 135–145)

## 2014-02-13 NOTE — Progress Notes (Signed)
Pre visit review using our clinic review tool, if applicable. No additional management support is needed unless otherwise documented below in the visit note. 

## 2014-02-13 NOTE — Assessment & Plan Note (Signed)
Chronic problem.  Tolerating Januvia w/o difficulty but pt may have insurance difficulties w/ medication.  Pt has savings card.  Encouraged her to try again after Jan 1st and see if insurance pays.  UTD on eye exam.  Asymptomatic.  Check labs.  Adjust meds prn

## 2014-02-13 NOTE — Patient Instructions (Signed)
Follow up in 3-4 months to recheck diabetes If the Januvia is too expensive after the 1st of the year, let me know Keep up the good work on healthy diet and regular exercise- you're doing great! We'll notify you of your lab results and make any changes if needed Call with any questions or concerns Happy New Year!!!

## 2014-02-13 NOTE — Progress Notes (Signed)
   Subjective:    Patient ID: [name] [name], female    DOB: [birth year], 58 y.o.   MRN: [id number]  HPI DM- chronic problem, on Januvia.  Pt reports w/ insurance changes, [employer] FriendlyJanuvia is going to be more expensive.  Pt is making an effort on healthy diet.  UTD on eye exam.  No numbness/tingling of hands/feet.  HTN- chronic problem, on Amlodipine.  No CP, SOB, HAs, visual changes, edema.  Hyperlipidemia- chronic problem, on Lipitor.  No abd pain, N/V, myalgias.   Review of Systems For ROS see HPI     Objective:   Physical Exam  Constitutional: She is oriented to person, place, and time. She appears well-developed and well-nourished. No distress.  HENT:  Head: Normocephalic and atraumatic.  Eyes: Conjunctivae and EOM are normal. Pupils are equal, round, and reactive to light.  Neck: Normal range of motion. Neck supple. No thyromegaly present.  Cardiovascular: Normal rate, regular rhythm, normal heart sounds and intact distal pulses.   No murmur heard. Pulmonary/Chest: Effort normal and breath sounds normal. No respiratory distress.  Abdominal: Soft. She exhibits no distension. There is no tenderness.  Musculoskeletal: She exhibits no edema.  Lymphadenopathy:    She has no cervical adenopathy.  Neurological: She is alert and oriented to person, place, and time.  Skin: Skin is warm and dry.  Psychiatric: She has a normal mood and affect. Her behavior is normal.  Vitals reviewed.         Assessment & Plan:

## 2014-02-13 NOTE — Assessment & Plan Note (Signed)
Chronic problem, well controlled.  Asymptomatic.  Check labs.  No anticipated med changes. °

## 2014-02-13 NOTE — Assessment & Plan Note (Signed)
Chronic problem.  Tolerating statin w/o difficulty.  Check labs.  Adjust meds prn  

## 2014-02-14 ENCOUNTER — Encounter: Payer: Self-pay | Admitting: *Deleted

## 2014-02-18 ENCOUNTER — Encounter: Payer: Self-pay | Admitting: Family Medicine

## 2014-02-19 MED ORDER — GLIPIZIDE ER 5 MG PO TB24: 5.0000 mg | ORAL_TABLET | Freq: Every day | ORAL | Status: DC

## 2014-03-06 ENCOUNTER — Encounter: Payer: Self-pay | Admitting: Family Medicine

## 2014-03-06 MED ORDER — GLIPIZIDE ER 5 MG PO TB24: 5.0000 mg | ORAL_TABLET | Freq: Every day | ORAL | Status: DC

## 2014-03-06 NOTE — Telephone Encounter (Signed)
Med filled.  

## 2014-03-15 ENCOUNTER — Encounter: Payer: Self-pay | Admitting: Family Medicine

## 2014-06-06 ENCOUNTER — Other Ambulatory Visit: Payer: Self-pay

## 2014-06-06 DIAGNOSIS — Z1231 Encounter for screening mammogram for malignant neoplasm of breast: Secondary | ICD-10-CM

## 2014-06-17 ENCOUNTER — Ambulatory Visit
Admission: RE | Admit: 2014-06-17 | Discharge: 2014-06-17 | Disposition: A | Discharge status: Home / Self Care | Payer: BLUE CROSS/BLUE SHIELD | Source: Ambulatory Visit

## 2014-06-17 DIAGNOSIS — Z1231 Encounter for screening mammogram for malignant neoplasm of breast: Secondary | ICD-10-CM

## 2014-08-13 ENCOUNTER — Encounter: Payer: Self-pay | Admitting: Physician Assistant

## 2014-08-13 ENCOUNTER — Ambulatory Visit (INDEPENDENT_AMBULATORY_CARE_PROVIDER_SITE_OTHER): Payer: BLUE CROSS/BLUE SHIELD | Admitting: Physician Assistant

## 2014-08-13 VITALS — BP 149/85 | HR 68 | Temp 97.9°F | Ht 67.5 in | Wt 211.0 lb

## 2014-08-13 DIAGNOSIS — B9789 Other viral agents as the cause of diseases classified elsewhere: Principal | ICD-10-CM

## 2014-08-13 DIAGNOSIS — J069 Acute upper respiratory infection, unspecified: Secondary | ICD-10-CM | POA: Diagnosis not present

## 2014-08-13 NOTE — Progress Notes (Signed)
History of Present Illness: [name] [name] is a 59 y.o. female who present to the clinic today complaining of sinus pressure, nasal congestion and PND x 2 days. Patient endorses voice hoarseness starting this am.  Patient denies chest congestion, SOB.  Endorses mild dry cough.   History: Past Medical History  Diagnosis Date  . Hypertension   . Hyperlipidemia   . History of shingles 2012    back  . Diabetes mellitus     started Metformin 12/2012    Current outpatient prescriptions:  .  amLODipine (NORVASC) 5 MG tablet, Take 1 tablet (5 mg total) by mouth daily., Disp: 90 tablet, Rfl: 1 .  Ascorbic Acid (VITAMIN C) 1000 MG tablet, Take 1,000 mg by mouth daily.  , Disp: , Rfl:  .  aspirin 81 MG EC tablet, Take 81 mg by mouth daily.  , Disp: , Rfl:  .  atorvastatin (LIPITOR) 20 MG tablet, Take 1 tablet (20 mg total) by mouth daily., Disp: 30 tablet, Rfl: 3 .  calcium carbonate (CALCIUM 500) 1250 MG tablet, Take 1 tablet by mouth daily.  , Disp: , Rfl:  .  glipiZIDE (GLUCOTROL XL) 5 MG 24 hr tablet, Take 1 tablet (5 mg total) by mouth daily with breakfast., Disp: 90 tablet, Rfl: 1 .  Multiple Vitamin (MULTIVITAMIN) tablet, Take 1 tablet by mouth daily.  , Disp: , Rfl:  .  Omega-3 Fatty Acids (FISH OIL PO), Take by mouth daily.  , Disp: , Rfl:  .  JANUVIA 100 MG tablet, TAKE 1 TABLET BY MOUTH DAILY (Patient not taking: Reported on 08/13/2014), Disp: 30 tablet, Rfl: 6 Allergies  Allergen Reactions  . Codeine   . Tramadol    Family History  Problem Relation Age of Onset  . Hypertension Mother   . Hypertension Father   . Hypertension Brother   . Diabetes Mother   . Breast cancer Mother 28   History   Social History  . Marital Status: Married    Spouse Name: N/A  . Number of Children: N/A  . Years of Education: N/A   Occupational History  . office administrator    Social History Main Topics  . Smoking status: Never Smoker   . Smokeless tobacco: Not on file  . Alcohol  Use: No  . Drug Use: No  . Sexual Activity:    Partners: Male   Other Topics Concern  . None   Social History Narrative   Married; step daughter   Regular exercise: walk 2 days a week   Caffeine use: 2 cups of coffee daily    Review of Systems: See HPI.  All other ROS are negative.  Physical Examination: BP 149/85 mmHg  Pulse 68  Temp(Src) 97.9 F (36.6 C) (Oral)  Ht 5' 7.5" (1.715 m)  Wt 211 lb (95.709 kg)  BMI 32.54 kg/m2  SpO2 100%  General appearance: alert, cooperative and appears stated age Head: Normocephalic, without obvious abnormality, atraumatic, sinuses tender to percussion Eyes: conjunctivae/corneas clear. PERRL, EOM's intact. Fundi benign. Ears: normal TM's and external ear canals both ears Nose: moderate congestion, turbinates swollen, sinus tenderness negative Throat: lips, mucosa, and tongue normal; teeth and gums normal Neck: no adenopathy, no carotid bruit, no JVD, supple, symmetrical, trachea midline and thyroid not enlarged, symmetric, no tenderness/mass/nodules Lungs: clear to auscultation bilaterally Chest wall: no tenderness  Assessment/Plan: Viral URI with cough Encouraged increased hydration and voice rest. Mucinex-DM. Humidifier in bedroom. Begin daily Claritin. Salt-water gargles for scratchy throat.  Follow-up PRN if symptoms are not resolving.

## 2014-08-13 NOTE — Patient Instructions (Signed)
Please stay well-hydrated.  Rest voice and place humidifier in bedroom. Continue Mucinex-D or DM. Start a daily claritin. Can add-on Delsym for cough.  Salt-water gargles of scratchy throat.   Call or return to clinic if symptoms are not improving.

## 2014-08-13 NOTE — Progress Notes (Signed)
Pre visit review using our clinic review tool, if applicable. No additional management support is needed unless otherwise documented below in the visit note. 

## 2014-08-13 NOTE — Assessment & Plan Note (Signed)
Encouraged increased hydration and voice rest. Mucinex-DM. Humidifier in bedroom. Begin daily Claritin. Salt-water gargles for scratchy throat.  Follow-up PRN if symptoms are not resolving.

## 2014-09-10 ENCOUNTER — Encounter: Payer: Self-pay | Admitting: Family Medicine

## 2014-09-10 ENCOUNTER — Ambulatory Visit (INDEPENDENT_AMBULATORY_CARE_PROVIDER_SITE_OTHER): Payer: BLUE CROSS/BLUE SHIELD | Admitting: Family Medicine

## 2014-09-10 VITALS — BP 138/80 | HR 68 | Temp 98.1°F | Resp 16 | Ht 67.0 in | Wt 212.0 lb

## 2014-09-10 DIAGNOSIS — R7989 Other specified abnormal findings of blood chemistry: Secondary | ICD-10-CM | POA: Diagnosis not present

## 2014-09-10 DIAGNOSIS — I10 Essential (primary) hypertension: Secondary | ICD-10-CM | POA: Diagnosis not present

## 2014-09-10 DIAGNOSIS — E119 Type 2 diabetes mellitus without complications: Secondary | ICD-10-CM | POA: Diagnosis not present

## 2014-09-10 DIAGNOSIS — E785 Hyperlipidemia, unspecified: Secondary | ICD-10-CM

## 2014-09-10 LAB — BASIC METABOLIC PANEL
BUN: 16 mg/dL (ref 6–23)
CO2: 28 mEq/L (ref 19–32)
Calcium: 9.8 mg/dL (ref 8.4–10.5)
Chloride: 102 mEq/L (ref 96–112)
Creatinine, Ser: 0.73 mg/dL (ref 0.40–1.20)
GFR: 86.26 mL/min (ref >60.00)
Glucose, Bld: 102 mg/dL — ABNORMAL HIGH (ref 70–99)
Potassium: 3.6 mEq/L (ref 3.5–5.1)
Sodium: 140 mEq/L (ref 135–145)

## 2014-09-10 LAB — CBC WITH DIFFERENTIAL/PLATELET
BASOS PCT: 0.5 % (ref 0.0–3.0)
Basophils Absolute: 0 10*3/uL (ref 0.0–0.1)
EOS PCT: 2.4 % (ref 0.0–5.0)
Eosinophils Absolute: 0.2 10*3/uL (ref 0.0–0.7)
HEMATOCRIT: 42.5 % (ref 36.0–46.0)
HEMOGLOBIN: 14.3 g/dL (ref 12.0–15.0)
Lymphocytes Relative: 31.2 % (ref 12.0–46.0)
Lymphs Abs: 2.5 10*3/uL (ref 0.7–4.0)
MCHC: 33.6 g/dL (ref 30.0–36.0)
MCV: 86.1 fl (ref 78.0–100.0)
Monocytes Absolute: 0.5 10*3/uL (ref 0.1–1.0)
Monocytes Relative: 6.3 % (ref 3.0–12.0)
NEUTROS ABS: 4.8 10*3/uL (ref 1.4–7.7)
Neutrophils Relative %: 59.6 % (ref 43.0–77.0)
Platelets: 247 10*3/uL (ref 150.0–400.0)
RBC: 4.94 Mil/uL (ref 3.87–5.11)
RDW: 12.9 % (ref 11.5–15.5)
WBC: 8 10*3/uL (ref 4.0–10.5)

## 2014-09-10 LAB — HEMOGLOBIN A1C: Hgb A1c MFr Bld: 6.4 % (ref 4.6–6.5)

## 2014-09-10 LAB — LIPID PANEL
CHOL/HDL RATIO: 4
Cholesterol: 194 mg/dL (ref 0–200)
HDL: 54.9 mg/dL (ref >39.00)
LDL CALC: 110 mg/dL — AB (ref 0–99)
NonHDL: 139.1
Triglycerides: 147 mg/dL (ref 0.0–149.0)
VLDL: 29.4 mg/dL (ref 0.0–40.0)

## 2014-09-10 LAB — HEPATIC FUNCTION PANEL
ALK PHOS: 62 U/L (ref 39–117)
ALT: 18 U/L (ref 0–35)
AST: 15 U/L (ref 0–37)
Albumin: 4.8 g/dL (ref 3.5–5.2)
Bilirubin, Direct: 0.1 mg/dL (ref 0.0–0.3)
Total Bilirubin: 0.5 mg/dL (ref 0.2–1.2)
Total Protein: 7.5 g/dL (ref 6.0–8.3)

## 2014-09-10 LAB — TSH: TSH: 5.59 u[IU]/mL — ABNORMAL HIGH (ref 0.35–4.50)

## 2014-09-10 LAB — MICROALBUMIN / CREATININE URINE RATIO
CREATININE, U: 21.2 mg/dL
MICROALB/CREAT RATIO: 3.3 mg/g (ref 0.0–30.0)
Microalb, Ur: <0.7 mg/dL (ref 0.0–1.9)

## 2014-09-10 MED ORDER — AMLODIPINE BESYLATE 5 MG PO TABS: 5.0000 mg | ORAL_TABLET | Freq: Every day | ORAL | Status: DC

## 2014-09-10 NOTE — Assessment & Plan Note (Signed)
Chronic problem.  On Glipizide.  UTD on eye exam, foot exam.  Check microalbumin as pt is not on ACE/ARB.  Check labs.  Adjust meds prn

## 2014-09-10 NOTE — Assessment & Plan Note (Signed)
Chronic problem.  Stopped statin.  Check labs and determine whether it is necessary to restart.  Will follow.

## 2014-09-10 NOTE — Progress Notes (Signed)
Pre visit review using our clinic review tool, if applicable. No additional management support is needed unless otherwise documented below in the visit note. 

## 2014-09-10 NOTE — Patient Instructions (Signed)
Follow up as scheduled for your physical We'll notify you of your lab results and make any changes if needed Continue to make healthy food choices and attempt to get regular exercise Call with any questions or concerns Hang in there!  You're doing great!!!

## 2014-09-10 NOTE — Assessment & Plan Note (Signed)
Chronic problem.  Adequate control.  Asymptomatic.  Check labs.  No med changes.

## 2014-09-10 NOTE — Progress Notes (Signed)
   Subjective:    Patient ID: [name] [name], female    DOB: [birth year], 59 y.o.   MRN: [id number]  HPI DM- chronic problem, on Glipizide.  Not taking januvia due to cost.  Intolerant to Metformin due to diarrhea.  UTD on eye exam, foot exam.  Not on ACE/ARB.  Due for microalbumin.  Denies symptomatic lows.  No numbness/tingling of hands/feet.  HTN- chronic problem, on Amlodipine.  Denies CP, SOB, HAs, visual changes, edema.  Hyperlipidemia- chronic problem, on taking Lipitor but not taking b/c 'it made me feel yuck'.  Denies abd pain, N/V.   Review of Systems For ROS see HPI     Objective:   Physical Exam  Constitutional: She is oriented to person, place, and time. She appears well-developed and well-nourished. No distress.  HENT:  Head: Normocephalic and atraumatic.  Eyes: Conjunctivae and EOM are normal. Pupils are equal, round, and reactive to light.  Neck: Normal range of motion. Neck supple. No thyromegaly present.  Cardiovascular: Normal rate, regular rhythm, normal heart sounds and intact distal pulses.   No murmur heard. Pulmonary/Chest: Effort normal and breath sounds normal. No respiratory distress.  Abdominal: Soft. She exhibits no distension. There is no tenderness.  Musculoskeletal: She exhibits no edema.  Lymphadenopathy:    She has no cervical adenopathy.  Neurological: She is alert and oriented to person, place, and time.  Skin: Skin is warm and dry.  Psychiatric: She has a normal mood and affect. Her behavior is normal.  Vitals reviewed.         Assessment & Plan:

## 2014-09-11 MED ORDER — LEVOTHYROXINE SODIUM 75 MCG PO TABS: 75.0000 ug | ORAL_TABLET | Freq: Every day | ORAL | Status: DC

## 2014-09-11 NOTE — Addendum Note (Signed)
Addended by: Dorette Grate on: 09/11/2014 08:17 AM   Modules accepted: Orders

## 2014-09-23 ENCOUNTER — Encounter: Payer: Self-pay | Admitting: Family Medicine

## 2014-09-23 DIAGNOSIS — I10 Essential (primary) hypertension: Secondary | ICD-10-CM

## 2014-09-23 MED ORDER — GLIPIZIDE ER 5 MG PO TB24: 5.0000 mg | ORAL_TABLET | Freq: Every day | ORAL | Status: DC

## 2014-09-23 MED ORDER — AMLODIPINE BESYLATE 5 MG PO TABS: 5.0000 mg | ORAL_TABLET | Freq: Every day | ORAL | Status: DC

## 2014-09-23 NOTE — Telephone Encounter (Signed)
meds filled

## 2014-10-14 ENCOUNTER — Other Ambulatory Visit (INDEPENDENT_AMBULATORY_CARE_PROVIDER_SITE_OTHER): Payer: BLUE CROSS/BLUE SHIELD

## 2014-10-14 DIAGNOSIS — R7989 Other specified abnormal findings of blood chemistry: Secondary | ICD-10-CM | POA: Diagnosis not present

## 2014-10-14 LAB — TSH: TSH: 1.15 u[IU]/mL (ref 0.35–4.50)

## 2014-10-15 ENCOUNTER — Encounter: Payer: Self-pay | Admitting: Family Medicine

## 2014-10-15 MED ORDER — LEVOTHYROXINE SODIUM 75 MCG PO TABS: 75.0000 ug | ORAL_TABLET | Freq: Every day | ORAL | Status: DC

## 2014-10-15 NOTE — Telephone Encounter (Signed)
Medication filled to pharmacy as requested.   

## 2015-02-19 ENCOUNTER — Encounter: Payer: BLUE CROSS/BLUE SHIELD | Admitting: Family Medicine

## 2015-03-11 ENCOUNTER — Encounter: Payer: Self-pay | Admitting: Family Medicine

## 2015-03-11 ENCOUNTER — Ambulatory Visit (INDEPENDENT_AMBULATORY_CARE_PROVIDER_SITE_OTHER): Payer: BLUE CROSS/BLUE SHIELD | Admitting: Family Medicine

## 2015-03-11 VITALS — BP 136/80 | HR 99 | Temp 98.1°F | Ht 67.0 in | Wt 206.5 lb

## 2015-03-11 DIAGNOSIS — E785 Hyperlipidemia, unspecified: Secondary | ICD-10-CM

## 2015-03-11 DIAGNOSIS — E039 Hypothyroidism, unspecified: Secondary | ICD-10-CM

## 2015-03-11 DIAGNOSIS — Z418 Encounter for other procedures for purposes other than remedying health state: Secondary | ICD-10-CM | POA: Diagnosis not present

## 2015-03-11 DIAGNOSIS — Z1159 Encounter for screening for other viral diseases: Secondary | ICD-10-CM

## 2015-03-11 DIAGNOSIS — Z0000 Encounter for general adult medical examination without abnormal findings: Secondary | ICD-10-CM | POA: Diagnosis not present

## 2015-03-11 DIAGNOSIS — M810 Age-related osteoporosis without current pathological fracture: Secondary | ICD-10-CM | POA: Diagnosis not present

## 2015-03-11 DIAGNOSIS — I10 Essential (primary) hypertension: Secondary | ICD-10-CM

## 2015-03-11 DIAGNOSIS — E119 Type 2 diabetes mellitus without complications: Secondary | ICD-10-CM | POA: Diagnosis not present

## 2015-03-11 DIAGNOSIS — B029 Zoster without complications: Secondary | ICD-10-CM

## 2015-03-11 DIAGNOSIS — Z293 Encounter for prophylactic fluoride administration: Secondary | ICD-10-CM

## 2015-03-11 DIAGNOSIS — E049 Nontoxic goiter, unspecified: Secondary | ICD-10-CM | POA: Diagnosis not present

## 2015-03-11 DIAGNOSIS — E010 Iodine-deficiency related diffuse (endemic) goiter: Secondary | ICD-10-CM

## 2015-03-11 MED ORDER — AMLODIPINE BESYLATE 5 MG PO TABS: 5.0000 mg | ORAL_TABLET | Freq: Every day | ORAL | Status: DC

## 2015-03-11 MED ORDER — LEVOTHYROXINE SODIUM 75 MCG PO TABS: 75.0000 ug | ORAL_TABLET | Freq: Every day | ORAL | Status: DC

## 2015-03-11 MED ORDER — GLIPIZIDE ER 5 MG PO TB24: 5.0000 mg | ORAL_TABLET | Freq: Every day | ORAL | Status: DC

## 2015-03-11 NOTE — Progress Notes (Signed)
Pre visit review using our clinic review tool, if applicable. No additional management support is needed unless otherwise documented below in the visit note. 

## 2015-03-11 NOTE — Patient Instructions (Addendum)
Call insurance to verify if they will approve shingles/zostavax injection and the Prevnar 13 injection.  Probiotic daily, [name], 10 strain probiotic daily  Preventive Care for Adults, Female  A healthy lifestyle and preventive care can promote health and wellness. Preventive health guidelines for women include the following key practices.  A routine yearly physical is a good way to check with your health care provider about your health and preventive screening. It is a chance to share any concerns and updates on your health and to receive a thorough exam.  Visit your dentist for a routine exam and preventive care every 6 months. Brush your teeth twice a day and floss once a day. Good oral hygiene prevents tooth decay and gum disease.  The frequency of eye exams is based on your age, health, family medical history, use of contact lenses, and other factors. Follow your health care provider's recommendations for frequency of eye exams.  Eat a healthy diet. Foods like vegetables, fruits, whole grains, low-fat dairy products, and lean protein foods contain the nutrients you need without too many calories. Decrease your intake of foods high in solid fats, added sugars, and salt. Eat the right amount of calories for you.Get information about a proper diet from your health care provider, if necessary.  Regular physical exercise is one of the most important things you can do for your health. Most adults should get at least 150 minutes of moderate-intensity exercise (any activity that increases your heart rate and causes you to sweat) each week. In addition, most adults need muscle-strengthening exercises on 2 or more days a week.  Maintain a healthy weight. The body mass index (BMI) is a screening tool to identify possible weight problems. It provides an estimate of body fat based on height and weight. Your health care provider can find your BMI and can help you achieve or maintain  a healthy weight.For adults 20 years and older:  A BMI below 18.5 is considered underweight.  A BMI of 18.5 to 24.9 is normal.  A BMI of 25 to 29.9 is considered overweight.  A BMI of 30 and above is considered obese.  Maintain normal blood lipids and cholesterol levels by exercising and minimizing your intake of saturated fat. Eat a balanced diet with plenty of fruit and vegetables. Blood tests for lipids and cholesterol should begin at age 71 and be repeated every 5 years. If your lipid or cholesterol levels are high, you are over 50, or you are at high risk for heart disease, you may need your cholesterol levels checked more frequently.Ongoing high lipid and cholesterol levels should be treated with medicines if diet and exercise are not working.  If you smoke, find out from your health care provider how to quit. If you do not use tobacco, do not start.  Lung cancer screening is recommended for adults aged 67-80 years who are at high risk for developing lung cancer because of a history of smoking. A yearly low-dose CT scan of the lungs is recommended for people who have at least a 30-pack-year history of smoking and are a current smoker or have quit within the past 15 years. A pack year of smoking is smoking an average of 1 pack of cigarettes a day for 1 year (for example: 1 pack a day for 30 years or 2 packs a day for 15 years). Yearly screening should continue until the smoker has stopped smoking for at least 15 years. Yearly screening should be stopped for  people who develop a health problem that would prevent them from having lung cancer treatment.  If you are pregnant, do not drink alcohol. If you are breastfeeding, be very cautious about drinking alcohol. If you are not pregnant and choose to drink alcohol, do not have more than 1 drink per day. One drink is considered to be 12 ounces (355 mL) of beer, 5 ounces (148 mL) of wine, or 1.5 ounces (44 mL) of liquor.  Avoid use of street  drugs. Do not share needles with anyone. Ask for help if you need support or instructions about stopping the use of drugs.  High blood pressure causes heart disease and increases the risk of stroke. Your blood pressure should be checked at least every 1 to 2 years. Ongoing high blood pressure should be treated with medicines if weight loss and exercise do not work.  If you are 69-1 years old, ask your health care provider if you should take aspirin to prevent strokes.  Diabetes screening is done by taking a blood sample to check your blood glucose level after you have not eaten for a certain period of time (fasting). If you are not overweight and you do not have risk factors for diabetes, you should be screened once every 3 years starting at age 76. If you are overweight or obese and you are 22-50 years of age, you should be screened for diabetes every year as part of your cardiovascular risk assessment.  Breast cancer screening is essential preventive care for women. You should practice "breast self-awareness." This means understanding the normal appearance and feel of your breasts and may include breast self-examination. Any changes detected, no matter how small, should be reported to a health care provider. Women in their 82s and 30s should have a clinical breast exam (CBE) by a health care provider as part of a regular health exam every 1 to 3 years. After age 55, women should have a CBE every year. Starting at age 22, women should consider having a mammogram (breast X-ray test) every year. Women who have a family history of breast cancer should talk to their health care provider about genetic screening. Women at a high risk of breast cancer should talk to their health care providers about having an MRI and a mammogram every year.  Breast cancer gene (BRCA)-related cancer risk assessment is recommended for women who have family members with BRCA-related cancers. BRCA-related cancers include breast,  ovarian, tubal, and peritoneal cancers. Having family members with these cancers may be associated with an increased risk for harmful changes (mutations) in the breast cancer genes BRCA1 and BRCA2. Results of the assessment will determine the need for genetic counseling and BRCA1 and BRCA2 testing.  Your health care provider may recommend that you be screened regularly for cancer of the pelvic organs (ovaries, uterus, and vagina). This screening involves a pelvic examination, including checking for microscopic changes to the surface of your cervix (Pap test). You may be encouraged to have this screening done every 3 years, beginning at age 71.  For women ages 64-65, health care providers may recommend pelvic exams and Pap testing every 3 years, or they may recommend the Pap and pelvic exam, combined with testing for human papilloma virus (HPV), every 5 years. Some types of HPV increase your risk of cervical cancer. Testing for HPV may also be done on women of any age with unclear Pap test results.  Other health care providers may not recommend any screening for nonpregnant women  who are considered low risk for pelvic cancer and who do not have symptoms. Ask your health care provider if a screening pelvic exam is right for you.  If you have had past treatment for cervical cancer or a condition that could lead to cancer, you need Pap tests and screening for cancer for at least 20 years after your treatment. If Pap tests have been discontinued, your risk factors (such as having a new sexual partner) need to be reassessed to determine if screening should resume. Some women have medical problems that increase the chance of getting cervical cancer. In these cases, your health care provider may recommend more frequent screening and Pap tests.  Colorectal cancer can be detected and often prevented. Most routine colorectal cancer screening begins at the age of 59 years and continues through age 107 years. However,  your health care provider may recommend screening at an earlier age if you have risk factors for colon cancer. On a yearly basis, your health care provider may provide home test kits to check for hidden blood in the stool. Use of a small camera at the end of a tube, to directly examine the colon (sigmoidoscopy or colonoscopy), can detect the earliest forms of colorectal cancer. Talk to your health care provider about this at age 49, when routine screening begins. Direct exam of the colon should be repeated every 5-10 years through age 40 years, unless early forms of precancerous polyps or small growths are found.  People who are at an increased risk for hepatitis B should be screened for this virus. You are considered at high risk for hepatitis B if:  You were born in a country where hepatitis B occurs often. Talk with your health care provider about which countries are considered high risk.  Your parents were born in a high-risk country and you have not received a shot to protect against hepatitis B (hepatitis B vaccine).  You have HIV or AIDS.  You use needles to inject street drugs.  You live with, or have sex with, someone who has hepatitis B.  You get hemodialysis treatment.  You take certain medicines for conditions like cancer, organ transplantation, and autoimmune conditions.  Hepatitis C blood testing is recommended for all people born from [birth year] through 1965 and any individual with known risks for hepatitis C.  Practice safe sex. Use condoms and avoid high-risk sexual practices to reduce the spread of sexually transmitted infections (STIs). STIs include gonorrhea, chlamydia, syphilis, trichomonas, herpes, HPV, and human immunodeficiency virus (HIV). Herpes, HIV, and HPV are viral illnesses that have no cure. They can result in disability, cancer, and death.  You should be screened for sexually transmitted illnesses (STIs) including gonorrhea and chlamydia if:  You are sexually  active and are younger than 24 years.  You are older than 24 years and your health care provider tells you that you are at risk for this type of infection.  Your sexual activity has changed since you were last screened and you are at an increased risk for chlamydia or gonorrhea. Ask your health care provider if you are at risk.  If you are at risk of being infected with HIV, it is recommended that you take a prescription medicine daily to prevent HIV infection. This is called preexposure prophylaxis (PrEP). You are considered at risk if:  You are sexually active and do not regularly use condoms or know the HIV status of your partner(s).  You take drugs by injection.  You are sexually  active with a partner who has HIV.  Talk with your health care provider about whether you are at high risk of being infected with HIV. If you choose to begin PrEP, you should first be tested for HIV. You should then be tested every 3 months for as long as you are taking PrEP.  Osteoporosis is a disease in which the bones lose minerals and strength with aging. This can result in serious bone fractures or breaks. The risk of osteoporosis can be identified using a bone density scan. Women ages 14 years and over and women at risk for fractures or osteoporosis should discuss screening with their health care providers. Ask your health care provider whether you should take a calcium supplement or vitamin D to reduce the rate of osteoporosis.  Menopause can be associated with physical symptoms and risks. Hormone replacement therapy is available to decrease symptoms and risks. You should talk to your health care provider about whether hormone replacement therapy is right for you.  Use sunscreen. Apply sunscreen liberally and repeatedly throughout the day. You should seek shade when your shadow is shorter than you. Protect yourself by wearing long sleeves, pants, a wide-brimmed hat, and sunglasses year round, whenever you are  outdoors.  Once a month, do a whole body skin exam, using a mirror to look at the skin on your back. Tell your health care provider of new moles, moles that have irregular borders, moles that are larger than a pencil eraser, or moles that have changed in shape or color.  Stay current with required vaccines (immunizations).  Influenza vaccine. All adults should be immunized every year.  Tetanus, diphtheria, and acellular pertussis (Td, Tdap) vaccine. Pregnant women should receive 1 dose of Tdap vaccine during each pregnancy. The dose should be obtained regardless of the length of time since the last dose. Immunization is preferred during the 27th-36th week of gestation. An adult who has not previously received Tdap or who does not know her vaccine status should receive 1 dose of Tdap. This initial dose should be followed by tetanus and diphtheria toxoids (Td) booster doses every 10 years. Adults with an unknown or incomplete history of completing a 3-dose immunization series with Td-containing vaccines should begin or complete a primary immunization series including a Tdap dose. Adults should receive a Td booster every 10 years.  Varicella vaccine. An adult without evidence of immunity to varicella should receive 2 doses or a second dose if she has previously received 1 dose. Pregnant females who do not have evidence of immunity should receive the first dose after pregnancy. This first dose should be obtained before leaving the health care facility. The second dose should be obtained 4-8 weeks after the first dose.  Human papillomavirus (HPV) vaccine. Females aged 13-26 years who have not received the vaccine previously should obtain the 3-dose series. The vaccine is not recommended for use in pregnant females. However, pregnancy testing is not needed before receiving a dose. If a female is found to be pregnant after receiving a dose, no treatment is needed. In that case, the remaining doses should be  delayed until after the pregnancy. Immunization is recommended for any person with an immunocompromised condition through the age of 76 years if she did not get any or all doses earlier. During the 3-dose series, the second dose should be obtained 4-8 weeks after the first dose. The third dose should be obtained 24 weeks after the first dose and 16 weeks after the second dose.  Zoster vaccine. One dose is recommended for adults aged 18 years or older unless certain conditions are present.  Measles, mumps, and rubella (MMR) vaccine. Adults born before [birth year] generally are considered immune to measles and mumps. Adults born in [birth year] or later should have 1 or more doses of MMR vaccine unless there is a contraindication to the vaccine or there is laboratory evidence of immunity to each of the three diseases. A routine second dose of MMR vaccine should be obtained at least 28 days after the first dose for students attending postsecondary schools, health care workers, or international travelers. People who received inactivated measles vaccine or an unknown type of measles vaccine during 1963-1967 should receive 2 doses of MMR vaccine. People who received inactivated mumps vaccine or an unknown type of mumps vaccine before 1979 and are at high risk for mumps infection should consider immunization with 2 doses of MMR vaccine. For females of childbearing age, rubella immunity should be determined. If there is no evidence of immunity, females who are not pregnant should be vaccinated. If there is no evidence of immunity, females who are pregnant should delay immunization until after pregnancy. Unvaccinated health care workers born before [birth year] who lack laboratory evidence of measles, mumps, or rubella immunity or laboratory confirmation of disease should consider measles and mumps immunization with 2 doses of MMR vaccine or rubella immunization with 1 dose of MMR vaccine.  Pneumococcal 13-valent conjugate (PCV13) vaccine.  When indicated, a person who is uncertain of his immunization history and has no record of immunization should receive the PCV13 vaccine. All adults 67 years of age and older should receive this vaccine. An adult aged 82 years or older who has certain medical conditions and has not been previously immunized should receive 1 dose of PCV13 vaccine. This PCV13 should be followed with a dose of pneumococcal polysaccharide (PPSV23) vaccine. Adults who are at high risk for pneumococcal disease should obtain the PPSV23 vaccine at least 8 weeks after the dose of PCV13 vaccine. Adults older than 62 years of age who have normal immune system function should obtain the PPSV23 vaccine dose at least 1 year after the dose of PCV13 vaccine.  Pneumococcal polysaccharide (PPSV23) vaccine. When PCV13 is also indicated, PCV13 should be obtained first. All adults aged 4 years and older should be immunized. An adult younger than age 57 years who has certain medical conditions should be immunized. Any person who resides in a nursing home or long-term care facility should be immunized. An adult smoker should be immunized. People with an immunocompromised condition and certain other conditions should receive both PCV13 and PPSV23 vaccines. People with human immunodeficiency virus (HIV) infection should be immunized as soon as possible after diagnosis. Immunization during chemotherapy or radiation therapy should be avoided. Routine use of PPSV23 vaccine is not recommended for American Indians, [address], or people younger than 65 years unless there are medical conditions that require PPSV23 vaccine. When indicated, people who have unknown immunization and have no record of immunization should receive PPSV23 vaccine. One-time revaccination 5 years after the first dose of PPSV23 is recommended for people aged 19-64 years who have chronic kidney failure, nephrotic syndrome, asplenia, or immunocompromised conditions. People who received  1-2 doses of PPSV23 before age 30 years should receive another dose of PPSV23 vaccine at age 69 years or later if at least 5 years have passed since the previous dose. Doses of PPSV23 are not needed for people immunized with PPSV23 at or after age 60  years.  Meningococcal vaccine. Adults with asplenia or persistent complement component deficiencies should receive 2 doses of quadrivalent meningococcal conjugate (MenACWY-D) vaccine. The doses should be obtained at least 2 months apart. Microbiologists working with certain meningococcal bacteria, military recruits, people at risk during an outbreak, and people who travel to or live in countries with a high rate of meningitis should be immunized. A first-year college student up through age 39 years who is living in a residence hall should receive a dose if she did not receive a dose on or after her 16th birthday. Adults who have certain high-risk conditions should receive one or more doses of vaccine.  Hepatitis A vaccine. Adults who wish to be protected from this disease, have certain high-risk conditions, work with hepatitis A-infected animals, work in hepatitis A research labs, or travel to or work in countries with a high rate of hepatitis A should be immunized. Adults who were previously unvaccinated and who anticipate close contact with an international adoptee during the first 60 days after arrival in the [place] from a country with a high rate of hepatitis A should be immunized.  Hepatitis B vaccine. Adults who wish to be protected from this disease, have certain high-risk conditions, may be exposed to blood or other infectious body fluids, are household contacts or sex partners of hepatitis B positive people, are clients or workers in certain care facilities, or travel to or work in countries with a high rate of hepatitis B should be immunized.  Haemophilus influenzae type b (Hib) vaccine. A previously unvaccinated person with asplenia or sickle  cell disease or having a scheduled splenectomy should receive 1 dose of Hib vaccine. Regardless of previous immunization, a recipient of a hematopoietic stem cell transplant should receive a 3-dose series 6-12 months after her successful transplant. Hib vaccine is not recommended for adults with HIV infection. Preventive Services / Frequency Ages 27 to 39 years  Blood pressure check.** / Every 3-5 years.  Lipid and cholesterol check.** / Every 5 years beginning at age 59.  Clinical breast exam.** / Every 3 years for women in their 75s and 30s.  BRCA-related cancer risk assessment.** / For women who have family members with a BRCA-related cancer (breast, ovarian, tubal, or peritoneal cancers).  Pap test.** / Every 2 years from ages 88 through 26. Every 3 years starting at age 38 through age 59 or 7 with a history of 3 consecutive normal Pap tests.  HPV screening.** / Every 3 years from ages 61 through ages 68 to 60 with a history of 3 consecutive normal Pap tests.  Hepatitis C blood test.** / For any individual with known risks for hepatitis C.  Skin self-exam. / Monthly.  Influenza vaccine. / Every year.  Tetanus, diphtheria, and acellular pertussis (Tdap, Td) vaccine.** / Consult your health care provider. Pregnant women should receive 1 dose of Tdap vaccine during each pregnancy. 1 dose of Td every 10 years.  Varicella vaccine.** / Consult your health care provider. Pregnant females who do not have evidence of immunity should receive the first dose after pregnancy.  HPV vaccine. / 3 doses over 6 months, if 26 and younger. The vaccine is not recommended for use in pregnant females. However, pregnancy testing is not needed before receiving a dose.  Measles, mumps, rubella (MMR) vaccine.** / You need at least 1 dose of MMR if you were born in 1957 or later. You may also need a 2nd dose. For females of childbearing age, rubella immunity should  be determined. If there is no evidence of  immunity, females who are not pregnant should be vaccinated. If there is no evidence of immunity, females who are pregnant should delay immunization until after pregnancy.  Pneumococcal 13-valent conjugate (PCV13) vaccine.** / Consult your health care provider.  Pneumococcal polysaccharide (PPSV23) vaccine.** / 1 to 2 doses if you smoke cigarettes or if you have certain conditions.  Meningococcal vaccine.** / 1 dose if you are age 33 to 74 years and a Market researcher living in a residence hall, or have one of several medical conditions, you need to get vaccinated against meningococcal disease. You may also need additional booster doses.  Hepatitis A vaccine.** / Consult your health care provider.  Hepatitis B vaccine.** / Consult your health care provider.  Haemophilus influenzae type b (Hib) vaccine.** / Consult your health care provider. Ages 81 to 38 years  Blood pressure check.** / Every year.  Lipid and cholesterol check.** / Every 5 years beginning at age 72 years.  Lung cancer screening. / Every year if you are aged 56-80 years and have a 30-pack-year history of smoking and currently smoke or have quit within the past 15 years. Yearly screening is stopped once you have quit smoking for at least 15 years or develop a health problem that would prevent you from having lung cancer treatment.  Clinical breast exam.** / Every year after age 94 years.  BRCA-related cancer risk assessment.** / For women who have family members with a BRCA-related cancer (breast, ovarian, tubal, or peritoneal cancers).  Mammogram.** / Every year beginning at age 40 years and continuing for as long as you are in good health. Consult with your health care provider.  Pap test.** / Every 3 years starting at age 47 years through age 84 or 34 years with a history of 3 consecutive normal Pap tests.  HPV screening.** / Every 3 years from ages 83 years through ages 24 to 81 years with a history of 3  consecutive normal Pap tests.  Fecal occult blood test (FOBT) of stool. / Every year beginning at age 80 years and continuing until age 61 years. You may not need to do this test if you get a colonoscopy every 10 years.  Flexible sigmoidoscopy or colonoscopy.** / Every 5 years for a flexible sigmoidoscopy or every 10 years for a colonoscopy beginning at age 30 years and continuing until age 74 years.  Hepatitis C blood test.** / For all people born from [birth year] through 1967 and any individual with known risks for hepatitis C.  Skin self-exam. / Monthly.  Influenza vaccine. / Every year.  Tetanus, diphtheria, and acellular pertussis (Tdap/Td) vaccine.** / Consult your health care provider. Pregnant women should receive 1 dose of Tdap vaccine during each pregnancy. 1 dose of Td every 10 years.  Varicella vaccine.** / Consult your health care provider. Pregnant females who do not have evidence of immunity should receive the first dose after pregnancy.  Zoster vaccine.** / 1 dose for adults aged 7 years or older.  Measles, mumps, rubella (MMR) vaccine.** / You need at least 1 dose of MMR if you were born in 1957 or later. You may also need a second dose. For females of childbearing age, rubella immunity should be determined. If there is no evidence of immunity, females who are not pregnant should be vaccinated. If there is no evidence of immunity, females who are pregnant should delay immunization until after pregnancy.  Pneumococcal 13-valent conjugate (PCV13) vaccine.** / Consult your  health care provider.  Pneumococcal polysaccharide (PPSV23) vaccine.** / 1 to 2 doses if you smoke cigarettes or if you have certain conditions.  Meningococcal vaccine.** / Consult your health care provider.  Hepatitis A vaccine.** / Consult your health care provider.  Hepatitis B vaccine.** / Consult your health care provider.  Haemophilus influenzae type b (Hib) vaccine.** / Consult your health care  provider. Ages 65 years and over  Blood pressure check.** / Every year.  Lipid and cholesterol check.** / Every 5 years beginning at age 66 years.  Lung cancer screening. / Every year if you are aged 55-80 years and have a 30-pack-year history of smoking and currently smoke or have quit within the past 15 years. Yearly screening is stopped once you have quit smoking for at least 15 years or develop a health problem that would prevent you from having lung cancer treatment.  Clinical breast exam.** / Every year after age 77 years.  BRCA-related cancer risk assessment.** / For women who have family members with a BRCA-related cancer (breast, ovarian, tubal, or peritoneal cancers).  Mammogram.** / Every year beginning at age 10 years and continuing for as long as you are in good health. Consult with your health care provider.  Pap test.** / Every 3 years starting at age 43 years through age 77 or 57 years with 3 consecutive normal Pap tests. Testing can be stopped between 65 and 70 years with 3 consecutive normal Pap tests and no abnormal Pap or HPV tests in the past 10 years.  HPV screening.** / Every 3 years from ages 34 years through ages 74 or 96 years with a history of 3 consecutive normal Pap tests. Testing can be stopped between 65 and 70 years with 3 consecutive normal Pap tests and no abnormal Pap or HPV tests in the past 10 years.  Fecal occult blood test (FOBT) of stool. / Every year beginning at age 34 years and continuing until age 18 years. You may not need to do this test if you get a colonoscopy every 10 years.  Flexible sigmoidoscopy or colonoscopy.** / Every 5 years for a flexible sigmoidoscopy or every 10 years for a colonoscopy beginning at age 51 years and continuing until age 34 years.  Hepatitis C blood test.** / For all people born from [birth year] through 1967 and any individual with known risks for hepatitis C.  Osteoporosis screening.** / A one-time screening for women ages  32 years and over and women at risk for fractures or osteoporosis.  Skin self-exam. / Monthly.  Influenza vaccine. / Every year.  Tetanus, diphtheria, and acellular pertussis (Tdap/Td) vaccine.** / 1 dose of Td every 10 years.  Varicella vaccine.** / Consult your health care provider.  Zoster vaccine.** / 1 dose for adults aged 45 years or older.  Pneumococcal 13-valent conjugate (PCV13) vaccine.** / Consult your health care provider.  Pneumococcal polysaccharide (PPSV23) vaccine.** / 1 dose for all adults aged 16 years and older.  Meningococcal vaccine.** / Consult your health care provider.  Hepatitis A vaccine.** / Consult your health care provider.  Hepatitis B vaccine.** / Consult your health care provider.  Haemophilus influenzae type b (Hib) vaccine.** / Consult your health care provider. ** Family history and personal history of risk and conditions may change your health care provider's recommendations.   This information is not intended to replace advice given to you by your health care provider. Make sure you discuss any questions you have with your health care provider.   Document  Released: 03/30/2001 Document Revised: 02/22/2014 Document Reviewed: 06/29/2010 Elsevier Interactive Patient Education [employer].

## 2015-03-12 LAB — CBC
HCT: 47.8 % — ABNORMAL HIGH (ref 36.0–46.0)
HEMOGLOBIN: 16 g/dL — AB (ref 12.0–15.0)
MCHC: 33.5 g/dL (ref 30.0–36.0)
MCV: 85.8 fl (ref 78.0–100.0)
PLATELETS: 281 10*3/uL (ref 150.0–400.0)
RBC: 5.57 Mil/uL — AB (ref 3.87–5.11)
RDW: 12.6 % (ref 11.5–15.5)
WBC: 9.6 10*3/uL (ref 4.0–10.5)

## 2015-03-12 LAB — COMPREHENSIVE METABOLIC PANEL
ALBUMIN: 5 g/dL (ref 3.5–5.2)
ALK PHOS: 81 U/L (ref 39–117)
ALT: 21 U/L (ref 0–35)
AST: 19 U/L (ref 0–37)
BUN: 15 mg/dL (ref 6–23)
CALCIUM: 9.6 mg/dL (ref 8.4–10.5)
CHLORIDE: 99 meq/L (ref 96–112)
CO2: 27 mEq/L (ref 19–32)
CREATININE: 0.8 mg/dL (ref 0.40–1.20)
GFR: 77.48 mL/min (ref >60.00)
Glucose, Bld: 154 mg/dL — ABNORMAL HIGH (ref 70–99)
POTASSIUM: 3.9 meq/L (ref 3.5–5.1)
Sodium: 137 mEq/L (ref 135–145)
TOTAL PROTEIN: 7.8 g/dL (ref 6.0–8.3)
Total Bilirubin: 0.5 mg/dL (ref 0.2–1.2)

## 2015-03-12 LAB — HEMOGLOBIN A1C: HEMOGLOBIN A1C: 8.4 % — AB (ref 4.6–6.5)

## 2015-03-12 LAB — TSH: TSH: 1.56 u[IU]/mL (ref 0.35–4.50)

## 2015-03-12 LAB — LIPID PANEL
CHOLESTEROL: 223 mg/dL — AB (ref 0–200)
HDL: 62.9 mg/dL (ref >39.00)
LDL CALC: 128 mg/dL — AB (ref 0–99)
NonHDL: 160.26
TRIGLYCERIDES: 160 mg/dL — AB (ref 0.0–149.0)
Total CHOL/HDL Ratio: 4
VLDL: 32 mg/dL (ref 0.0–40.0)

## 2015-03-12 LAB — HEPATITIS C ANTIBODY: HCV AB: NEGATIVE

## 2015-03-13 MED ORDER — GLIPIZIDE ER 5 MG PO TB24: 5.0000 mg | ORAL_TABLET | Freq: Two times a day (BID) | ORAL | Status: DC

## 2015-03-23 ENCOUNTER — Encounter: Payer: Self-pay | Admitting: Family Medicine

## 2015-03-23 NOTE — Assessment & Plan Note (Signed)
Encouraged heart healthy diet, increase exercise, avoid trans fats, consider a krill oil cap daily 

## 2015-03-23 NOTE — Assessment & Plan Note (Signed)
On Levothyroxine, continue to monitor 

## 2015-03-23 NOTE — Progress Notes (Signed)
Subjective:    Patient ID: [name] [name], female    DOB: 10/05/1955, 60 y.o.   MRN: [id number]  Chief Complaint  Patient presents with  . Annual Exam    HPI Patient is in today for annual exam and follow up on numerous medical conditions. No recent illness or hospitalizations. No new acute concerns. Is staying active and trying to maintain a heart healthy diet. Has an appt with opthamology on April 17, 2015. No visual complaints today. She is under a great deal of stress at work. She works at a [employer] and the owner has pancreatic cancer so she is working excessive hours to keep the business running. Denies CP/palp/SOB/HA/congestion/fevers/GI or GU c/o. Taking meds as prescribed  Past Medical History  Diagnosis Date  . Hypertension   . Hyperlipidemia   . History of shingles 2012    back  . Diabetes mellitus     started Metformin 12/2012  . Hypothyroidism 07/29/2010    S/p FNA showing non-neoplastic goiter 7/12 w/ Dr Pollyann Kennedy     Past Surgical History  Procedure Laterality Date  . Appendectomy    . Abdominal hysterectomy    . Tonsillectomy    . Oophorectomy    . Shoulder arthroscopy w/ rotator cuff repair  2007    rt  . Mass excision  03/02/2011    Procedure: EXCISION MASS;  Surgeon: Nicki Reaper, MD;  Location: [facility];  Service: Orthopedics;  Laterality: Right;  Excision Cyst Interphangeal Right Thumb  . Trigger finger release  11/01/2011    Procedure: RELEASE TRIGGER FINGER/A-1 PULLEY;  Surgeon: Nicki Reaper, MD;  Location: [facility];  Service: Orthopedics;  Laterality: Right;  right thumb, possible excision cyst    Family History  Problem Relation Age of Onset  . Hypertension Mother   . Diabetes Mother   . Breast cancer Mother 47  . Other Mother   . Hypertension Father   . Cancer Father     throat  . Alcohol abuse Father   . Hypertension Brother   . Heart disease Brother     heart murmur  . Alcohol abuse Brother   . Cancer  Maternal Aunt   . Cancer Maternal Grandmother   . Heart disease Maternal Grandfather   . Heart disease Paternal Grandmother     Social History   Social History  . Marital Status: Married    Spouse Name: N/A  . Number of Children: N/A  . Years of Education: N/A   Occupational History  . office administrator    Social History Main Topics  . Smoking status: Never Smoker   . Smokeless tobacco: Not on file  . Alcohol Use: No  . Drug Use: No  . Sexual Activity:    Partners: Male     Comment: lives with husband, works at [employer], no major dietary    Other Topics Concern  . Not on file   Social History Narrative   Married; step daughter   Regular exercise: walk 2 days a week   Caffeine use: 2 cups of coffee daily    Outpatient Prescriptions Prior to Visit  Medication Sig Dispense Refill  . Ascorbic Acid (VITAMIN C) 1000 MG tablet Take 1,000 mg by mouth daily.      Marland Kitchen aspirin 81 MG EC tablet Take 81 mg by mouth daily.      . calcium carbonate (CALCIUM 500) 1250 MG tablet Take 1 tablet by mouth daily.      Marland Kitchen  Multiple Vitamin (MULTIVITAMIN) tablet Take 1 tablet by mouth daily.      . Omega-3 Fatty Acids (FISH OIL PO) Take by mouth daily.      Marland Kitchen amLODipine (NORVASC) 5 MG tablet Take 1 tablet (5 mg total) by mouth daily. 90 tablet 1  . glipiZIDE (GLUCOTROL XL) 5 MG 24 hr tablet Take 1 tablet (5 mg total) by mouth daily with breakfast. 90 tablet 1  . levothyroxine (SYNTHROID, LEVOTHROID) 75 MCG tablet Take 1 tablet (75 mcg total) by mouth daily before breakfast. 90 tablet 1   No facility-administered medications prior to visit.    Allergies  Allergen Reactions  . Codeine   . Tramadol     Review of Systems  Constitutional: Negative for fever, chills and malaise/fatigue.  HENT: Negative for congestion and hearing loss.   Eyes: Negative for discharge.  Respiratory: Negative for cough, sputum production and shortness of breath.   Cardiovascular: Negative for chest pain,  palpitations and leg swelling.  Gastrointestinal: Negative for heartburn, nausea, vomiting, abdominal pain, diarrhea, constipation and blood in stool.  Genitourinary: Negative for dysuria, urgency, frequency and hematuria.  Musculoskeletal: Negative for myalgias, back pain and falls.  Skin: Negative for rash.  Neurological: Negative for dizziness, sensory change, loss of consciousness, weakness and headaches.  Endo/Heme/Allergies: Negative for environmental allergies. Does not bruise/bleed easily.  Psychiatric/Behavioral: Negative for depression and suicidal ideas. The patient is not nervous/anxious and does not have insomnia.        Objective:    Physical Exam  Constitutional: She is oriented to person, place, and time. She appears well-developed and well-nourished. No distress.  HENT:  Head: Normocephalic and atraumatic.  Eyes: Conjunctivae are normal.  Neck: Neck supple. No thyromegaly present.  Cardiovascular: Normal rate, regular rhythm and normal heart sounds.   No murmur heard. Pulmonary/Chest: Effort normal and breath sounds normal. No respiratory distress.  Abdominal: Soft. Bowel sounds are normal. She exhibits no distension and no mass. There is no tenderness.  Musculoskeletal: She exhibits no edema.  Lymphadenopathy:    She has no cervical adenopathy.  Neurological: She is alert and oriented to person, place, and time.  Skin: Skin is warm and dry.  Psychiatric: She has a normal mood and affect. Her behavior is normal.    BP 136/80 mmHg  Pulse 99  Temp(Src) 98.1 F (36.7 C) (Oral)  Ht 5\' 7"  (1.702 m)  Wt 206 lb 8 oz (93.668 kg)  BMI 32.33 kg/m2  SpO2 97% Wt Readings from Last 3 Encounters:  03/11/15 206 lb 8 oz (93.668 kg)  09/10/14 212 lb (96.163 kg)  08/13/14 211 lb (95.709 kg)     Lab Results  Component Value Date   WBC 9.6 03/11/2015   HGB 16.0* 03/11/2015   HCT 47.8* 03/11/2015   PLT 281.0 03/11/2015   GLUCOSE 154* 03/11/2015   CHOL 223* 03/11/2015     TRIG 160.0* 03/11/2015   HDL 62.90 03/11/2015   LDLCALC 128* 03/11/2015   ALT 21 03/11/2015   AST 19 03/11/2015   NA 137 03/11/2015   K 3.9 03/11/2015   CL 99 03/11/2015   CREATININE 0.80 03/11/2015   BUN 15 03/11/2015   CO2 27 03/11/2015   TSH 1.56 03/11/2015   HGBA1C 8.4* 03/11/2015   MICROALBUR <0.7 09/10/2014    Lab Results  Component Value Date   TSH 1.56 03/11/2015   Lab Results  Component Value Date   WBC 9.6 03/11/2015   HGB 16.0* 03/11/2015   HCT 47.8* 03/11/2015  MCV 85.8 03/11/2015   PLT 281.0 03/11/2015   Lab Results  Component Value Date   NA 137 03/11/2015   K 3.9 03/11/2015   CO2 27 03/11/2015   GLUCOSE 154* 03/11/2015   BUN 15 03/11/2015   CREATININE 0.80 03/11/2015   BILITOT 0.5 03/11/2015   ALKPHOS 81 03/11/2015   AST 19 03/11/2015   ALT 21 03/11/2015   PROT 7.8 03/11/2015   ALBUMIN 5.0 03/11/2015   CALCIUM 9.6 03/11/2015   GFR 77.48 03/11/2015   Lab Results  Component Value Date   CHOL 223* 03/11/2015   Lab Results  Component Value Date   HDL 62.90 03/11/2015   Lab Results  Component Value Date   LDLCALC 128* 03/11/2015   Lab Results  Component Value Date   TRIG 160.0* 03/11/2015   Lab Results  Component Value Date   CHOLHDL 4 03/11/2015   Lab Results  Component Value Date   HGBA1C 8.4* 03/11/2015       Assessment & Plan:   Problem List Items Addressed This Visit    Annual physical exam    Patient denies any difficulties at home. See EMR for functional status screen and depression screen. No recent changes to vision or hearing. Is UTD with immunizations. Is UTD with screening. Discussed Advanced Directives. Encouraged heart healthy diet, exercise as tolerated and adequate sleep. See patient's problem list for health risk factors to monitor. See AVS for preventative healthcare recommendation schedule. Labs ordered and reviewed Flu shot given today      Relevant Orders   CBC (Completed)   TSH (Completed)    Hemoglobin A1c (Completed)   Lipid panel (Completed)   Comprehensive metabolic panel (Completed)   DM type 2 (diabetes mellitus, type 2) (HCC)    hgba1c unacceptable, minimize simple carbs. Increase exercise as tolerated. Continue current meds but increase Glipizide to bid      Relevant Medications   glipiZIDE (GLUCOTROL XL) 5 MG 24 hr tablet   Other Relevant Orders   Hemoglobin A1c (Completed)   Hemoglobin A1c   Essential hypertension - Primary    Well controlled, no changes to meds. Encouraged heart healthy diet such as the DASH diet and exercise as tolerated.       Relevant Medications   amLODipine (NORVASC) 5 MG tablet   Other Relevant Orders   CBC (Completed)   Lipid panel (Completed)   Comprehensive metabolic panel (Completed)   CBC   Lipid panel   Comprehensive metabolic panel   Hyperlipidemia with target LDL less than 100    Encouraged heart healthy diet, increase exercise, avoid trans fats, consider a krill oil cap daily      Relevant Medications   amLODipine (NORVASC) 5 MG tablet   Other Relevant Orders   Lipid panel (Completed)   CBC   Lipid panel   Comprehensive metabolic panel   Hypothyroidism    On Levothyroxine, continue to monitor      Relevant Medications   levothyroxine (SYNTHROID, LEVOTHROID) 75 MCG tablet   Osteoporosis, post-menopausal    Maintain adequate vitamin D and calcium intake, stay physically active      Shingles    Encouraged Zostavax she will check with her insurance carrier about payment       Other Visit Diagnoses    Need for hepatitis C screening test        Relevant Orders    Hepatitis C antibody (Completed)    Need for prophylactic fluoride administration  Relevant Orders    Flu Vaccine QUAD 36+ mos IM (Completed)       I have discontinued Ms. Bridgers's glipiZIDE. I have also changed her glipiZIDE. Additionally, I am having her maintain her aspirin, calcium carbonate, Omega-3 Fatty Acids (FISH OIL PO), multivitamin,  vitamin C, levothyroxine, and amLODipine.  Meds ordered this encounter  Medications  . levothyroxine (SYNTHROID, LEVOTHROID) 75 MCG tablet    Sig: Take 1 tablet (75 mcg total) by mouth daily before breakfast.    Dispense:  90 tablet    Refill:  1  . DISCONTD: glipiZIDE (GLUCOTROL XL) 5 MG 24 hr tablet    Sig: Take 1 tablet (5 mg total) by mouth daily with breakfast.    Dispense:  90 tablet    Refill:  1  . amLODipine (NORVASC) 5 MG tablet    Sig: Take 1 tablet (5 mg total) by mouth daily.    Dispense:  90 tablet    Refill:  1  . glipiZIDE (GLUCOTROL XL) 5 MG 24 hr tablet    Sig: Take 1 tablet (5 mg total) by mouth 2 (two) times daily.    Dispense:  180 tablet    Refill:  1     Danise Edge, MD

## 2015-03-23 NOTE — Assessment & Plan Note (Signed)
Encouraged Zostavax she will check with her insurance carrier about payment

## 2015-03-23 NOTE — Assessment & Plan Note (Signed)
hgba1c unacceptable, minimize simple carbs. Increase exercise as tolerated. Continue current meds but increase Glipizide to bid

## 2015-03-23 NOTE — Assessment & Plan Note (Signed)
Maintain adequate vitamin D and calcium intake, stay physically active

## 2015-03-23 NOTE — Assessment & Plan Note (Addendum)
Patient denies any difficulties at home. See EMR for functional status screen and depression screen. No recent changes to vision or hearing. Is UTD with immunizations. Is UTD with screening. Discussed Advanced Directives. Encouraged heart healthy diet, exercise as tolerated and adequate sleep. See patient's problem list for health risk factors to monitor. See AVS for preventative healthcare recommendation schedule. Labs ordered and reviewed Flu shot given today

## 2015-03-23 NOTE — Assessment & Plan Note (Signed)
Well controlled, no changes to meds. Encouraged heart healthy diet such as the DASH diet and exercise as tolerated.  °

## 2015-04-17 LAB — HM DIABETES EYE EXAM

## 2015-05-20 ENCOUNTER — Other Ambulatory Visit: Payer: Self-pay | Admitting: Family Medicine

## 2015-05-20 ENCOUNTER — Other Ambulatory Visit (INDEPENDENT_AMBULATORY_CARE_PROVIDER_SITE_OTHER): Payer: BLUE CROSS/BLUE SHIELD

## 2015-05-20 ENCOUNTER — Encounter: Payer: Self-pay | Admitting: Family Medicine

## 2015-05-20 DIAGNOSIS — R350 Frequency of micturition: Secondary | ICD-10-CM

## 2015-05-20 NOTE — Telephone Encounter (Signed)
Can be reached: [phone]  Reason for call: Pt having cont sx of UTI and wanting to come in for labs. Please advise.

## 2015-05-21 LAB — URINALYSIS, ROUTINE W REFLEX MICROSCOPIC
Bilirubin Urine: NEGATIVE
KETONES UR: NEGATIVE
Leukocytes, UA: NEGATIVE
NITRITE: NEGATIVE
PH: 5.5 (ref 5.0–8.0)
SPECIFIC GRAVITY, URINE: 1.02 (ref 1.000–1.030)
Urine Glucose: NEGATIVE
Urobilinogen, UA: 0.2 (ref 0.0–1.0)

## 2015-05-23 ENCOUNTER — Other Ambulatory Visit: Payer: Self-pay | Admitting: Family Medicine

## 2015-05-23 LAB — CULTURE, URINE COMPREHENSIVE

## 2015-05-23 MED ORDER — SULFAMETHOXAZOLE-TRIMETHOPRIM 800-160 MG PO TABS: 1.0000 | ORAL_TABLET | Freq: Two times a day (BID) | ORAL | Status: DC

## 2015-06-10 ENCOUNTER — Other Ambulatory Visit: Payer: Self-pay

## 2015-06-10 DIAGNOSIS — Z1231 Encounter for screening mammogram for malignant neoplasm of breast: Secondary | ICD-10-CM

## 2015-06-11 ENCOUNTER — Other Ambulatory Visit (INDEPENDENT_AMBULATORY_CARE_PROVIDER_SITE_OTHER): Payer: BLUE CROSS/BLUE SHIELD

## 2015-06-11 ENCOUNTER — Other Ambulatory Visit: Payer: BLUE CROSS/BLUE SHIELD

## 2015-06-11 DIAGNOSIS — I10 Essential (primary) hypertension: Secondary | ICD-10-CM | POA: Diagnosis not present

## 2015-06-11 DIAGNOSIS — E785 Hyperlipidemia, unspecified: Secondary | ICD-10-CM | POA: Diagnosis not present

## 2015-06-11 DIAGNOSIS — E119 Type 2 diabetes mellitus without complications: Secondary | ICD-10-CM | POA: Diagnosis not present

## 2015-06-11 LAB — CBC
HCT: 42.9 % (ref 36.0–46.0)
Hemoglobin: 14.6 g/dL (ref 12.0–15.0)
MCHC: 34 g/dL (ref 30.0–36.0)
MCV: 85.2 fl (ref 78.0–100.0)
PLATELETS: 268 10*3/uL (ref 150.0–400.0)
RBC: 5.03 Mil/uL (ref 3.87–5.11)
RDW: 12.8 % (ref 11.5–15.5)
WBC: 7.6 10*3/uL (ref 4.0–10.5)

## 2015-06-11 LAB — LIPID PANEL
CHOLESTEROL: 199 mg/dL (ref 0–200)
HDL: 52.2 mg/dL (ref >39.00)
LDL Cholesterol: 120 mg/dL — ABNORMAL HIGH (ref 0–99)
NonHDL: 146.81
TRIGLYCERIDES: 134 mg/dL (ref 0.0–149.0)
Total CHOL/HDL Ratio: 4
VLDL: 26.8 mg/dL (ref 0.0–40.0)

## 2015-06-11 LAB — COMPREHENSIVE METABOLIC PANEL
ALBUMIN: 4.7 g/dL (ref 3.5–5.2)
ALK PHOS: 65 U/L (ref 39–117)
ALT: 17 U/L (ref 0–35)
AST: 15 U/L (ref 0–37)
BILIRUBIN TOTAL: 0.5 mg/dL (ref 0.2–1.2)
BUN: 13 mg/dL (ref 6–23)
CO2: 28 mEq/L (ref 19–32)
Calcium: 9.9 mg/dL (ref 8.4–10.5)
Chloride: 101 mEq/L (ref 96–112)
Creatinine, Ser: 0.79 mg/dL (ref 0.40–1.20)
GFR: 78.55 mL/min (ref >60.00)
Glucose, Bld: 120 mg/dL — ABNORMAL HIGH (ref 70–99)
POTASSIUM: 4.2 meq/L (ref 3.5–5.1)
Sodium: 138 mEq/L (ref 135–145)
TOTAL PROTEIN: 7.4 g/dL (ref 6.0–8.3)

## 2015-06-11 LAB — HEMOGLOBIN A1C: Hgb A1c MFr Bld: 6.6 % — ABNORMAL HIGH (ref 4.6–6.5)

## 2015-06-13 ENCOUNTER — Encounter: Payer: Self-pay | Admitting: Family Medicine

## 2015-06-13 ENCOUNTER — Ambulatory Visit (INDEPENDENT_AMBULATORY_CARE_PROVIDER_SITE_OTHER): Payer: BLUE CROSS/BLUE SHIELD | Admitting: Family Medicine

## 2015-06-13 VITALS — BP 132/88 | HR 62 | Temp 98.3°F | Ht 67.0 in | Wt 205.0 lb

## 2015-06-13 DIAGNOSIS — I10 Essential (primary) hypertension: Secondary | ICD-10-CM

## 2015-06-13 DIAGNOSIS — E038 Other specified hypothyroidism: Secondary | ICD-10-CM

## 2015-06-13 DIAGNOSIS — Z23 Encounter for immunization: Secondary | ICD-10-CM

## 2015-06-13 DIAGNOSIS — E0590 Thyrotoxicosis, unspecified without thyrotoxic crisis or storm: Secondary | ICD-10-CM

## 2015-06-13 DIAGNOSIS — E119 Type 2 diabetes mellitus without complications: Secondary | ICD-10-CM

## 2015-06-13 DIAGNOSIS — M546 Pain in thoracic spine: Secondary | ICD-10-CM | POA: Diagnosis not present

## 2015-06-13 DIAGNOSIS — M810 Age-related osteoporosis without current pathological fracture: Secondary | ICD-10-CM

## 2015-06-13 DIAGNOSIS — E669 Obesity, unspecified: Secondary | ICD-10-CM

## 2015-06-13 DIAGNOSIS — M549 Dorsalgia, unspecified: Secondary | ICD-10-CM

## 2015-06-13 DIAGNOSIS — E1169 Type 2 diabetes mellitus with other specified complication: Secondary | ICD-10-CM

## 2015-06-13 DIAGNOSIS — E782 Mixed hyperlipidemia: Secondary | ICD-10-CM

## 2015-06-13 HISTORY — DX: Dorsalgia, unspecified: M54.9

## 2015-06-13 MED ORDER — GLIPIZIDE ER 5 MG PO TB24: 5.0000 mg | ORAL_TABLET | Freq: Two times a day (BID) | ORAL | Status: DC

## 2015-06-13 MED ORDER — LEVOTHYROXINE SODIUM 75 MCG PO TABS: 75.0000 ug | ORAL_TABLET | Freq: Every day | ORAL | Status: DC

## 2015-06-13 NOTE — Patient Instructions (Signed)

## 2015-06-13 NOTE — Assessment & Plan Note (Signed)
Encouraged moist heat and gentle stretching as tolerated. May try NSAIDs and prescription meds as directed and report if symptoms worsen or seek immediate care. Moist heat, topical treatments

## 2015-06-13 NOTE — Progress Notes (Signed)
Patient ID: [name] [name], female   DOB: [birth year], 60 y.o.   MRN: [id number]   Subjective:    Patient ID: [name] [name], female    DOB: [birth year], 60 y.o.   MRN: [id number]  Chief Complaint  Patient presents with  . Follow-up    HPI Patient is in today for follow up. Is feeling well today. No recent illness or hospitalization. Denies polyuria or polydipsia. Denies CP/palp/SOB/HA/congestion/fevers/GI or GU c/o. Taking meds as prescribed  Past Medical History  Diagnosis Date  . Hypertension   . Hyperlipidemia   . History of shingles 2012    back  . Diabetes mellitus     started Metformin 12/2012  . Hypothyroidism 07/29/2010    S/p FNA showing non-neoplastic goiter 7/12 w/ Dr Pollyann Kennedyosen   . Back pain 06/13/2015    Past Surgical History  Procedure Laterality Date  . Appendectomy    . Abdominal hysterectomy    . Tonsillectomy    . Oophorectomy    . Shoulder arthroscopy w/ rotator cuff repair  2007    rt  . Mass excision  03/02/2011    Procedure: EXCISION MASS;  Surgeon: Nicki ReaperGary R Kuzma, MD;  Location: [facility];  Service: Orthopedics;  Laterality: Right;  Excision Cyst Interphangeal Right Thumb  . Trigger finger release  11/01/2011    Procedure: RELEASE TRIGGER FINGER/A-1 PULLEY;  Surgeon: Nicki ReaperGary R Kuzma, MD;  Location: [facility];  Service: Orthopedics;  Laterality: Right;  right thumb, possible excision cyst    Family History  Problem Relation Age of Onset  . Hypertension Mother   . Diabetes Mother   . Breast cancer Mother [id number]  . Other Mother   . Hypertension Father   . Cancer Father     throat  . Alcohol abuse Father   . Hypertension Brother   . Heart disease Brother     heart murmur  . Alcohol abuse Brother   . Cancer Maternal Aunt   . Cancer Maternal Grandmother   . Heart disease Maternal Grandfather   . Heart disease Paternal Grandmother     Social History   Social History  . Marital Status: Married    Spouse Name: N/A  .  Number of Children: N/A  . Years of Education: N/A   Occupational History  . office administrator    Social History Main Topics  . Smoking status: Never Smoker   . Smokeless tobacco: Not on file  . Alcohol Use: No  . Drug Use: No  . Sexual Activity:    Partners: Male     Comment: lives with husband, works at [employer], no major dietary    Other Topics Concern  . Not on file   Social History Narrative   Married; step daughter   Regular exercise: walk 2 days a week   Caffeine use: 2 cups of coffee daily    Outpatient Prescriptions Prior to Visit  Medication Sig Dispense Refill  . amLODipine (NORVASC) 5 MG tablet Take 1 tablet (5 mg total) by mouth daily. 90 tablet 1  . Ascorbic Acid (VITAMIN C) 1000 MG tablet Take 1,000 mg by mouth daily.      Marland Kitchen. aspirin 81 MG EC tablet Take 81 mg by mouth daily.      . calcium carbonate (CALCIUM 500) 1250 MG tablet Take 1 tablet by mouth daily.      . Multiple Vitamin (MULTIVITAMIN) tablet Take 1 tablet by mouth daily.      .Marland Kitchen  Omega-3 Fatty Acids (FISH OIL PO) Take by mouth daily.      Marland Kitchen glipiZIDE (GLUCOTROL XL) 5 MG 24 hr tablet Take 1 tablet (5 mg total) by mouth 2 (two) times daily. 180 tablet 1  . levothyroxine (SYNTHROID, LEVOTHROID) 75 MCG tablet Take 1 tablet (75 mcg total) by mouth daily before breakfast. 90 tablet 1  . sulfamethoxazole-trimethoprim (BACTRIM DS,SEPTRA DS) 800-160 MG tablet Take 1 tablet by mouth 2 (two) times daily. 14 tablet 0   No facility-administered medications prior to visit.    Allergies  Allergen Reactions  . Codeine   . Tramadol     Review of Systems  Constitutional: Negative for fever and malaise/fatigue.  HENT: Negative for congestion.   Eyes: Negative for blurred vision.  Respiratory: Negative for shortness of breath.   Cardiovascular: Negative for chest pain, palpitations and leg swelling.  Gastrointestinal: Negative for nausea, abdominal pain and blood in stool.  Genitourinary: Negative for  dysuria and frequency.  Musculoskeletal: Positive for back pain. Negative for falls.  Skin: Negative for rash.  Neurological: Negative for dizziness, loss of consciousness and headaches.  Endo/Heme/Allergies: Negative for environmental allergies.  Psychiatric/Behavioral: Negative for depression. The patient is nervous/anxious.        Objective:    Physical Exam  Constitutional: She is oriented to person, place, and time. She appears well-developed and well-nourished. No distress.  HENT:  Head: Normocephalic and atraumatic.  Nose: Nose normal.  Eyes: Right eye exhibits no discharge. Left eye exhibits no discharge.  Neck: Normal range of motion. Neck supple.  Cardiovascular: Normal rate and regular rhythm.   No murmur heard. Pulmonary/Chest: Effort normal and breath sounds normal.  Abdominal: Soft. Bowel sounds are normal. There is no tenderness.  Musculoskeletal: She exhibits no edema.  Neurological: She is alert and oriented to person, place, and time.  Skin: Skin is warm and dry.  Psychiatric: She has a normal mood and affect.  Vitals reviewed.   BP 132/88 mmHg  Pulse 62  Temp(Src) 98.3 F (36.8 C) (Oral)  Ht  (1.702 m)  Wt 205 lb (92.987 kg)  BMI 32.10 kg/m2  SpO2 98% Wt Readings from Last 3 Encounters:  06/13/15 205 lb (92.987 kg)  03/11/15 206 lb 8 oz (93.668 kg)  09/10/14 212 lb (96.163 kg)     Lab Results  Component Value Date   WBC 7.6 06/11/2015   HGB 14.6 06/11/2015   HCT 42.9 06/11/2015   PLT 268.0 06/11/2015   GLUCOSE 120* 06/11/2015   CHOL 199 06/11/2015   TRIG 134.0 06/11/2015   HDL 52.20 06/11/2015   LDLCALC 120* 06/11/2015   ALT 17 06/11/2015   AST 15 06/11/2015   NA 138 06/11/2015   K 4.2 06/11/2015   CL 101 06/11/2015   CREATININE 0.79 06/11/2015   BUN 13 06/11/2015   CO2 28 06/11/2015   TSH 1.56 03/11/2015   HGBA1C 6.6* 06/11/2015   MICROALBUR <0.7 09/10/2014    Lab Results  Component Value Date   TSH 1.56 03/11/2015   Lab  Results  Component Value Date   WBC 7.6 06/11/2015   HGB 14.6 06/11/2015   HCT 42.9 06/11/2015   MCV 85.2 06/11/2015   PLT 268.0 06/11/2015   Lab Results  Component Value Date   NA 138 06/11/2015   K 4.2 06/11/2015   CO2 28 06/11/2015   GLUCOSE 120* 06/11/2015   BUN 13 06/11/2015   CREATININE 0.79 06/11/2015   BILITOT 0.5 06/11/2015   ALKPHOS 65 06/11/2015  AST 15 06/11/2015   ALT 17 06/11/2015   PROT 7.4 06/11/2015   ALBUMIN 4.7 06/11/2015   CALCIUM 9.9 06/11/2015   GFR 78.55 06/11/2015   Lab Results  Component Value Date   CHOL 199 06/11/2015   Lab Results  Component Value Date   HDL 52.20 06/11/2015   Lab Results  Component Value Date   LDLCALC 120* 06/11/2015   Lab Results  Component Value Date   TRIG 134.0 06/11/2015   Lab Results  Component Value Date   CHOLHDL 4 06/11/2015   Lab Results  Component Value Date   HGBA1C 6.6* 06/11/2015       Assessment & Plan:   Problem List Items Addressed This Visit    Subclinical hyperthyroidism    On Levothyroxine, continue to monitor      Relevant Medications   levothyroxine (SYNTHROID, LEVOTHROID) 75 MCG tablet   Osteoporosis, post-menopausal    Encouraged to get adequate exercise, calcium and vitamin d intake      Hypothyroidism    On Levothyroxine, continue to monitor      Relevant Medications   levothyroxine (SYNTHROID, LEVOTHROID) 75 MCG tablet   Other Relevant Orders   TSH   CBC   Comprehensive metabolic panel   Lipid panel   Hemoglobin A1c   Essential hypertension - Primary    Well controlled, no changes to meds. Encouraged heart healthy diet such as the DASH diet and exercise as tolerated.       Relevant Orders   TSH   CBC   Comprehensive metabolic panel   Lipid panel   Hemoglobin A1c   Back pain    Encouraged moist heat and gentle stretching as tolerated. May try NSAIDs and prescription meds as directed and report if symptoms worsen or seek immediate care. Moist heat, topical  treatments      Relevant Orders   TSH   CBC   Comprehensive metabolic panel   Lipid panel   Hemoglobin A1c    Other Visit Diagnoses    Diabetes mellitus type 2 in obese (HCC)        Relevant Medications    glipiZIDE (GLUCOTROL XL) 5 MG 24 hr tablet    Other Relevant Orders    TSH    CBC    Comprehensive metabolic panel    Lipid panel    Hemoglobin A1c    Hyperlipidemia, mixed        Relevant Orders    TSH    CBC    Comprehensive metabolic panel    Lipid panel    Hemoglobin A1c    Need for varicella vaccine        Relevant Orders    Varicella-zoster vaccine subcutaneous (Completed)    Need for vaccination with 13-polyvalent pneumococcal conjugate vaccine        Relevant Orders    Pneumococcal conjugate vaccine 13-valent (Completed)       I have discontinued Ms. Ceniceros's sulfamethoxazole-trimethoprim. I am also having her maintain her aspirin, calcium carbonate, Omega-3 Fatty Acids (FISH OIL PO), multivitamin, vitamin C, amLODipine, levothyroxine, and glipiZIDE.  Meds ordered this encounter  Medications  . DISCONTD: levothyroxine (SYNTHROID, LEVOTHROID) 75 MCG tablet    Sig: Take 1 tablet (75 mcg total) by mouth daily before breakfast.    Dispense:  90 tablet    Refill:  1  . DISCONTD: glipiZIDE (GLUCOTROL XL) 5 MG 24 hr tablet    Sig: Take 1 tablet (5 mg total) by mouth 2 (two) times daily.  Dispense:  180 tablet    Refill:  1  . levothyroxine (SYNTHROID, LEVOTHROID) 75 MCG tablet    Sig: Take 1 tablet (75 mcg total) by mouth daily before breakfast.    Dispense:  90 tablet    Refill:  1  . glipiZIDE (GLUCOTROL XL) 5 MG 24 hr tablet    Sig: Take 1 tablet (5 mg total) by mouth 2 (two) times daily.    Dispense:  180 tablet    Refill:  1     Danise Edge, MD

## 2015-06-13 NOTE — Assessment & Plan Note (Signed)
Well controlled, no changes to meds. Encouraged heart healthy diet such as the DASH diet and exercise as tolerated.  °

## 2015-06-13 NOTE — Assessment & Plan Note (Signed)
On Levothyroxine, continue to monitor 

## 2015-06-13 NOTE — Assessment & Plan Note (Signed)
hgba1c acceptable, minimize simple carbs. Increase exercise as toolerated. Continue current meds

## 2015-06-24 NOTE — Assessment & Plan Note (Signed)
On Levothyroxine, continue to monitor 

## 2015-06-24 NOTE — Assessment & Plan Note (Signed)
Encouraged to get adequate exercise, calcium and vitamin d intake 

## 2015-06-25 ENCOUNTER — Ambulatory Visit
Admission: RE | Admit: 2015-06-25 | Discharge: 2015-06-25 | Disposition: A | Discharge status: Home / Self Care | Payer: BLUE CROSS/BLUE SHIELD | Source: Ambulatory Visit

## 2015-06-25 DIAGNOSIS — Z1231 Encounter for screening mammogram for malignant neoplasm of breast: Secondary | ICD-10-CM | POA: Diagnosis not present

## 2015-08-21 ENCOUNTER — Encounter: Payer: Self-pay | Admitting: Family Medicine

## 2015-08-21 ENCOUNTER — Other Ambulatory Visit: Payer: Self-pay | Admitting: Family Medicine

## 2015-08-21 DIAGNOSIS — I10 Essential (primary) hypertension: Secondary | ICD-10-CM

## 2015-08-21 MED ORDER — AMLODIPINE BESYLATE 5 MG PO TABS: 5.0000 mg | ORAL_TABLET | Freq: Every day | ORAL | Status: DC

## 2015-08-21 MED ORDER — LEVOTHYROXINE SODIUM 75 MCG PO TABS: 75.0000 ug | ORAL_TABLET | Freq: Every day | ORAL | Status: DC

## 2015-11-21 ENCOUNTER — Other Ambulatory Visit: Payer: BLUE CROSS/BLUE SHIELD

## 2015-11-28 ENCOUNTER — Encounter: Payer: BLUE CROSS/BLUE SHIELD | Admitting: Family Medicine

## 2016-01-20 ENCOUNTER — Other Ambulatory Visit (INDEPENDENT_AMBULATORY_CARE_PROVIDER_SITE_OTHER): Payer: BLUE CROSS/BLUE SHIELD

## 2016-01-20 DIAGNOSIS — M546 Pain in thoracic spine: Secondary | ICD-10-CM

## 2016-01-20 DIAGNOSIS — I10 Essential (primary) hypertension: Secondary | ICD-10-CM

## 2016-01-20 DIAGNOSIS — E782 Mixed hyperlipidemia: Secondary | ICD-10-CM

## 2016-01-20 DIAGNOSIS — E1169 Type 2 diabetes mellitus with other specified complication: Secondary | ICD-10-CM

## 2016-01-20 DIAGNOSIS — E038 Other specified hypothyroidism: Secondary | ICD-10-CM

## 2016-01-20 DIAGNOSIS — E669 Obesity, unspecified: Secondary | ICD-10-CM

## 2016-01-20 LAB — LIPID PANEL
CHOLESTEROL: 198 mg/dL (ref 0–200)
HDL: 58.3 mg/dL (ref >39.00)
LDL Cholesterol: 119 mg/dL — ABNORMAL HIGH (ref 0–99)
NONHDL: 139.9
Total CHOL/HDL Ratio: 3
Triglycerides: 105 mg/dL (ref 0.0–149.0)
VLDL: 21 mg/dL (ref 0.0–40.0)

## 2016-01-20 LAB — CBC
HEMATOCRIT: 42.8 % (ref 36.0–46.0)
Hemoglobin: 14.7 g/dL (ref 12.0–15.0)
MCHC: 34.4 g/dL (ref 30.0–36.0)
MCV: 83.2 fl (ref 78.0–100.0)
Platelets: 243 10*3/uL (ref 150.0–400.0)
RBC: 5.15 Mil/uL — AB (ref 3.87–5.11)
RDW: 12.8 % (ref 11.5–15.5)
WBC: 7.9 10*3/uL (ref 4.0–10.5)

## 2016-01-20 LAB — COMPREHENSIVE METABOLIC PANEL
ALBUMIN: 4.7 g/dL (ref 3.5–5.2)
ALK PHOS: 73 U/L (ref 39–117)
ALT: 16 U/L (ref 0–35)
AST: 15 U/L (ref 0–37)
BILIRUBIN TOTAL: 0.6 mg/dL (ref 0.2–1.2)
BUN: 14 mg/dL (ref 6–23)
CO2: 26 mEq/L (ref 19–32)
CREATININE: 0.81 mg/dL (ref 0.40–1.20)
Calcium: 9.6 mg/dL (ref 8.4–10.5)
Chloride: 102 mEq/L (ref 96–112)
GFR: 76.16 mL/min (ref >60.00)
GLUCOSE: 164 mg/dL — AB (ref 70–99)
POTASSIUM: 3.9 meq/L (ref 3.5–5.1)
SODIUM: 139 meq/L (ref 135–145)
TOTAL PROTEIN: 7.2 g/dL (ref 6.0–8.3)

## 2016-01-20 LAB — HEMOGLOBIN A1C: HEMOGLOBIN A1C: 7 % — AB (ref 4.6–6.5)

## 2016-01-20 LAB — TSH: TSH: 1.18 u[IU]/mL (ref 0.35–4.50)

## 2016-01-27 ENCOUNTER — Encounter: Payer: Self-pay | Admitting: Family Medicine

## 2016-01-27 ENCOUNTER — Ambulatory Visit (INDEPENDENT_AMBULATORY_CARE_PROVIDER_SITE_OTHER): Payer: BLUE CROSS/BLUE SHIELD | Admitting: Family Medicine

## 2016-01-27 VITALS — BP 126/78 | HR 74 | Temp 97.6°F | Wt 202.4 lb

## 2016-01-27 DIAGNOSIS — Z0000 Encounter for general adult medical examination without abnormal findings: Secondary | ICD-10-CM | POA: Diagnosis not present

## 2016-01-27 DIAGNOSIS — E785 Hyperlipidemia, unspecified: Secondary | ICD-10-CM | POA: Diagnosis not present

## 2016-01-27 DIAGNOSIS — E118 Type 2 diabetes mellitus with unspecified complications: Secondary | ICD-10-CM

## 2016-01-27 DIAGNOSIS — I10 Essential (primary) hypertension: Secondary | ICD-10-CM | POA: Diagnosis not present

## 2016-01-27 DIAGNOSIS — E038 Other specified hypothyroidism: Secondary | ICD-10-CM | POA: Diagnosis not present

## 2016-01-27 DIAGNOSIS — M810 Age-related osteoporosis without current pathological fracture: Secondary | ICD-10-CM

## 2016-01-27 MED ORDER — LISINOPRIL 2.5 MG PO TABS: 2.5000 mg | ORAL_TABLET | Freq: Every day | ORAL | 2 refills | Status: DC

## 2016-01-27 MED ORDER — GLIPIZIDE ER 5 MG PO TB24: 5.0000 mg | ORAL_TABLET | Freq: Two times a day (BID) | ORAL | 1 refills | Status: DC

## 2016-01-27 MED ORDER — LEVOTHYROXINE SODIUM 75 MCG PO TABS: 75.0000 ug | ORAL_TABLET | Freq: Every day | ORAL | 1 refills | Status: DC

## 2016-01-27 MED ORDER — AMLODIPINE BESYLATE 5 MG PO TABS: 5.0000 mg | ORAL_TABLET | Freq: Every day | ORAL | 1 refills | Status: DC

## 2016-01-27 NOTE — Patient Instructions (Addendum)
Dr Dennard Nip bariatric specialist .[email or web address]   Preventive Care 40-64 Years, Female Preventive care refers to lifestyle choices and visits with your health care provider that can promote health and wellness. What does preventive care include?  A yearly physical exam. This is also called an annual well check.  Dental exams once or twice a year.  Routine eye exams. Ask your health care provider how often you should have your eyes checked.  Personal lifestyle choices, including:  Daily care of your teeth and gums.  Regular physical activity.  Eating a healthy diet.  Avoiding tobacco and drug use.  Limiting alcohol use.  Practicing safe sex.  Taking low-dose aspirin daily starting at age 8.  Taking vitamin and mineral supplements as recommended by your health care provider. What happens during an annual well check? The services and screenings done by your health care provider during your annual well check will depend on your age, overall health, lifestyle risk factors, and family history of disease. Counseling  Your health care provider may ask you questions about your:  Alcohol use.  Tobacco use.  Drug use.  Emotional well-being.  Home and relationship well-being.  Sexual activity.  Eating habits.  Work and work Statistician.  Method of birth control.  Menstrual cycle.  Pregnancy history. Screening  You may have the following tests or measurements:  Height, weight, and BMI.  Blood pressure.  Lipid and cholesterol levels. These may be checked every 5 years, or more frequently if you are over 84 years old.  Skin check.  Lung cancer screening. You may have this screening every year starting at age 67 if you have a 30-pack-year history of smoking and currently smoke or have quit within the past 15 years.  Fecal occult blood test (FOBT) of the stool. You may have this test every year starting at age 58.  Flexible sigmoidoscopy or colonoscopy. You  may have a sigmoidoscopy every 5 years or a colonoscopy every 10 years starting at age 30.  Hepatitis C blood test.  Hepatitis B blood test.  Sexually transmitted disease (STD) testing.  Diabetes screening. This is done by checking your blood sugar (glucose) after you have not eaten for a while (fasting). You may have this done every 1-3 years.  Mammogram. This may be done every 1-2 years. Talk to your health care provider about when you should start having regular mammograms. This may depend on whether you have a family history of breast cancer.  BRCA-related cancer screening. This may be done if you have a family history of breast, ovarian, tubal, or peritoneal cancers.  Pelvic exam and Pap test. This may be done every 3 years starting at age 25. Starting at age 25, this may be done every 5 years if you have a Pap test in combination with an HPV test.  Bone density scan. This is done to screen for osteoporosis. You may have this scan if you are at high risk for osteoporosis. Discuss your test results, treatment options, and if necessary, the need for more tests with your health care provider. Vaccines  Your health care provider may recommend certain vaccines, such as:  Influenza vaccine. This is recommended every year.  Tetanus, diphtheria, and acellular pertussis (Tdap, Td) vaccine. You may need a Td booster every 10 years.  Varicella vaccine. You may need this if you have not been vaccinated.  Zoster vaccine. You may need this after age 56.  Measles, mumps, and rubella (MMR) vaccine. You may need at least  one dose of MMR if you were born in 1957 or later. You may also need a second dose.  Pneumococcal 13-valent conjugate (PCV13) vaccine. You may need this if you have certain conditions and were not previously vaccinated.  Pneumococcal polysaccharide (PPSV23) vaccine. You may need one or two doses if you smoke cigarettes or if you have certain conditions.  Meningococcal vaccine.  You may need this if you have certain conditions.  Hepatitis A vaccine. You may need this if you have certain conditions or if you travel or work in places where you may be exposed to hepatitis A.  Hepatitis B vaccine. You may need this if you have certain conditions or if you travel or work in places where you may be exposed to hepatitis B.  Haemophilus influenzae type b (Hib) vaccine. You may need this if you have certain conditions. Talk to your health care provider about which screenings and vaccines you need and how often you need them. This information is not intended to replace advice given to you by your health care provider. Make sure you discuss any questions you have with your health care provider. Document Released: 02/28/2015 Document Revised: 10/22/2015 Document Reviewed: 12/03/2014 Elsevier Interactive Patient Education  2017 [employer].

## 2016-01-27 NOTE — Assessment & Plan Note (Signed)
On Levothyroxine, continue to monitor 

## 2016-01-27 NOTE — Assessment & Plan Note (Signed)
hgba1c acceptable, minimize simple carbs. Increase exercise as tolerated. Continue current meds 

## 2016-01-27 NOTE — Progress Notes (Signed)
Pre visit review using our clinic review tool, if applicable. No additional management support is needed unless otherwise documented below in the visit note. 

## 2016-01-27 NOTE — Assessment & Plan Note (Addendum)
Patient encouraged to maintain heart healthy diet, regular exercise, adequate sleep. Consider daily probiotics. Take medications as prescribed. Patient encouraged to maintain heart healthy diet, regular exercise, adequate sleep. Consider daily probiotics. Take medications as prescribed. Given and reviewed copy of ACP documents from [place] Secretary of State and encouraged to complete and return 

## 2016-01-27 NOTE — Assessment & Plan Note (Signed)
Encouraged heart healthy diet, increase exercise, avoid trans fats, consider a krill oil cap daily 

## 2016-01-27 NOTE — Assessment & Plan Note (Signed)
Encouraged to get adequate exercise, calcium and vitamin d intake 

## 2016-01-27 NOTE — Assessment & Plan Note (Signed)
Well controlled, no changes to meds. Encouraged heart healthy diet such as the DASH diet and exercise as tolerated.  °

## 2016-01-27 NOTE — Progress Notes (Signed)
Patient ID: [name] [name] Defenbaugh, female   DOB: 05-11-1955, 60 y.o.   MRN: [id number]   Subjective:    Patient ID: [name] [name] Armstead, female    DOB: 05-11-1955, 60 y.o.   MRN: [id number]  Chief Complaint  Patient presents with  . Annual Exam    HPI Patient is in today for annual preventative exam and follow up. She is feeling well for the most part. No recent hospitalizations or acute concerns. Is not exercising regularly. Tries to maintain a heart healthy diet. Denies CP/palp/SOB/HA/congestion/fevers/GI or GU [name]/o. Taking meds as prescribed  Past Medical History:  Diagnosis Date  . Back pain 06/13/2015  . Diabetes mellitus    started Metformin 12/2012  . History of shingles 2012   back  . Hyperlipidemia   . Hypertension   . Hypothyroidism 07/29/2010   S/p FNA showing non-neoplastic goiter 7/12 w/ Dr Pollyann Kennedyosen     Past Surgical History:  Procedure Laterality Date  . ABDOMINAL HYSTERECTOMY    . APPENDECTOMY    . MASS EXCISION  03/02/2011   Procedure: EXCISION MASS;  Surgeon: Nicki ReaperGary R Kuzma, MD;  Location: [facility];  Service: Orthopedics;  Laterality: Right;  Excision Cyst Interphangeal Right Thumb  . OOPHORECTOMY    . SHOULDER ARTHROSCOPY W/ ROTATOR CUFF REPAIR  2007   rt  . TONSILLECTOMY    . TRIGGER FINGER RELEASE  11/01/2011   Procedure: RELEASE TRIGGER FINGER/A-1 PULLEY;  Surgeon: Nicki ReaperGary R Kuzma, MD;  Location: [facility];  Service: Orthopedics;  Laterality: Right;  right thumb, possible excision cyst    Family History  Problem Relation Age of Onset  . Hypertension Mother   . Diabetes Mother   . Breast cancer Mother [id number]  . Other Mother   . Hypertension Father   . Cancer Father     throat  . Alcohol abuse Father   . Hypertension Brother   . Heart disease Brother     heart murmur  . Alcohol abuse Brother   . Cancer Maternal Aunt   . Cancer Maternal Grandmother   . Heart disease Maternal Grandfather   . Heart disease Paternal Grandmother      Social History   Social History  . Marital status: Married    Spouse name: N/A  . Number of children: N/A  . Years of education: N/A   Occupational History  . office administrator    Social History Main Topics  . Smoking status: Never Smoker  . Smokeless tobacco: Not on file  . Alcohol use No  . Drug use: No  . Sexual activity: Yes    Partners: Male     Comment: lives with husband, works at [employer], no major dietary    Other Topics Concern  . Not on file   Social History Narrative   Married; step daughter   Regular exercise: walk 2 days a week   Caffeine use: 2 cups of coffee daily    Outpatient Medications Prior to Visit  Medication Sig Dispense Refill  . Ascorbic Acid (VITAMIN [name]) 1000 MG tablet Take 1,000 mg by mouth daily.      Marland Kitchen. aspirin 81 MG EC tablet Take 81 mg by mouth daily.      . calcium carbonate (CALCIUM 500) 1250 MG tablet Take 1 tablet by mouth daily.      . Multiple Vitamin (MULTIVITAMIN) tablet Take 1 tablet by mouth daily.      . Omega-3 Fatty Acids (FISH OIL PO) Take by  mouth daily.      Marland Kitchen. amLODipine (NORVASC) 5 MG tablet Take 1 tablet (5 mg total) by mouth daily. 90 tablet 1  . glipiZIDE (GLUCOTROL XL) 5 MG 24 hr tablet Take 1 tablet (5 mg total) by mouth 2 (two) times daily. 180 tablet 1  . levothyroxine (SYNTHROID, LEVOTHROID) 75 MCG tablet Take 1 tablet (75 mcg total) by mouth daily before breakfast. 90 tablet 1   No facility-administered medications prior to visit.     Allergies  Allergen Reactions  . Codeine   . Tramadol     ROS     Objective:    Physical Exam  BP 126/78 (BP Location: Left Arm, Patient Position: Sitting, Cuff Size: Large)   Pulse 74   Temp 97.6 F (36.4 [name]) (Oral)   Wt 202 lb 6.4 oz (91.8 kg)   SpO2 98% Comment: RA  BMI 31.70 kg/m  Wt Readings from Last 3 Encounters:  01/27/16 202 lb 6.4 oz (91.8 kg)  06/13/15 205 lb (93 kg)  03/11/15 206 lb 8 oz (93.7 kg)     Lab Results  Component Value Date    WBC 7.9 01/20/2016   HGB 14.7 01/20/2016   HCT 42.8 01/20/2016   PLT 243.0 01/20/2016   GLUCOSE 164 (H) 01/20/2016   CHOL 198 01/20/2016   TRIG 105.0 01/20/2016   HDL 58.30 01/20/2016   LDLCALC 119 (H) 01/20/2016   ALT 16 01/20/2016   AST 15 01/20/2016   NA 139 01/20/2016   K 3.9 01/20/2016   CL 102 01/20/2016   CREATININE 0.81 01/20/2016   BUN 14 01/20/2016   CO2 26 01/20/2016   TSH 1.18 01/20/2016   HGBA1C 7.0 (H) 01/20/2016   MICROALBUR <0.7 09/10/2014    Lab Results  Component Value Date   TSH 1.18 01/20/2016   Lab Results  Component Value Date   WBC 7.9 01/20/2016   HGB 14.7 01/20/2016   HCT 42.8 01/20/2016   MCV 83.2 01/20/2016   PLT 243.0 01/20/2016   Lab Results  Component Value Date   NA 139 01/20/2016   K 3.9 01/20/2016   CO2 26 01/20/2016   GLUCOSE 164 (H) 01/20/2016   BUN 14 01/20/2016   CREATININE 0.81 01/20/2016   BILITOT 0.6 01/20/2016   ALKPHOS 73 01/20/2016   AST 15 01/20/2016   ALT 16 01/20/2016   PROT 7.2 01/20/2016   ALBUMIN 4.7 01/20/2016   CALCIUM 9.6 01/20/2016   GFR 76.16 01/20/2016   Lab Results  Component Value Date   CHOL 198 01/20/2016   Lab Results  Component Value Date   HDL 58.30 01/20/2016   Lab Results  Component Value Date   LDLCALC 119 (H) 01/20/2016   Lab Results  Component Value Date   TRIG 105.0 01/20/2016   Lab Results  Component Value Date   CHOLHDL 3 01/20/2016   Lab Results  Component Value Date   HGBA1C 7.0 (H) 01/20/2016       Assessment & Plan:   Problem List Items Addressed This Visit    Essential hypertension    Well controlled, no changes to meds. Encouraged heart healthy diet such as the DASH diet and exercise as tolerated.       Relevant Medications   amLODipine (NORVASC) 5 MG tablet   lisinopril (ZESTRIL) 2.5 MG tablet   Other Relevant Orders   CBC   Comprehensive metabolic panel   TSH   Comprehensive metabolic panel   Annual physical exam    Patient encouraged  to maintain  heart healthy diet, regular exercise, adequate sleep. Consider daily probiotics. Take medications as prescribed. Patient encouraged to maintain heart healthy diet, regular exercise, adequate sleep. Consider daily probiotics. Take medications as prescribed. Given and reviewed copy of ACP documents from [facility] Secretary of State and encouraged to complete and return      Hypothyroidism    On Levothyroxine, continue to monitor      Relevant Medications   levothyroxine (SYNTHROID, LEVOTHROID) 75 MCG tablet   DM type 2 (diabetes mellitus, type 2) (HCC)    hgba1c acceptable, minimize simple carbs. Increase exercise as tolerated. Continue current meds      Relevant Medications   glipiZIDE (GLUCOTROL XL) 5 MG 24 hr tablet   lisinopril (ZESTRIL) 2.5 MG tablet   Other Relevant Orders   Hemoglobin A1c   Comprehensive metabolic panel   Urine Microalbumin w/creat. ratio   Hyperlipidemia with target LDL less than 100    Encouraged heart healthy diet, increase exercise, avoid trans fats, consider a krill oil cap daily      Relevant Medications   amLODipine (NORVASC) 5 MG tablet   lisinopril (ZESTRIL) 2.5 MG tablet   Other Relevant Orders   Lipid panel   Osteoporosis, post-menopausal    Encouraged to get adequate exercise, calcium and vitamin d intake.      Relevant Orders   VITAMIN D 25 Hydroxy (Vit-D Deficiency, Fractures)      I am having Ms. Riemann start on lisinopril. I am also having her maintain her aspirin, calcium carbonate, Omega-3 Fatty Acids (FISH OIL PO), multivitamin, vitamin [name], amLODipine, glipiZIDE, and levothyroxine.  Meds ordered this encounter  Medications  . amLODipine (NORVASC) 5 MG tablet    Sig: Take 1 tablet (5 mg total) by mouth daily.    Dispense:  90 tablet    Refill:  1  . glipiZIDE (GLUCOTROL XL) 5 MG 24 hr tablet    Sig: Take 1 tablet (5 mg total) by mouth 2 (two) times daily.    Dispense:  180 tablet    Refill:  1  . levothyroxine (SYNTHROID, LEVOTHROID) 75  MCG tablet    Sig: Take 1 tablet (75 mcg total) by mouth daily before breakfast.    Dispense:  90 tablet    Refill:  1  . lisinopril (ZESTRIL) 2.5 MG tablet    Sig: Take 1 tablet (2.5 mg total) by mouth daily.    Dispense:  90 tablet    Refill:  2     Danise Edge, MD

## 2016-02-18 ENCOUNTER — Ambulatory Visit (INDEPENDENT_AMBULATORY_CARE_PROVIDER_SITE_OTHER): Payer: BLUE CROSS/BLUE SHIELD | Admitting: Family Medicine

## 2016-02-18 ENCOUNTER — Encounter: Payer: Self-pay | Admitting: Family Medicine

## 2016-02-18 VITALS — BP 134/72 | HR 72 | Temp 98.1°F | Wt 203.0 lb

## 2016-02-18 DIAGNOSIS — J0100 Acute maxillary sinusitis, unspecified: Secondary | ICD-10-CM

## 2016-02-18 MED ORDER — AMOXICILLIN-POT CLAVULANATE 875-125 MG PO TABS: 1.0000 | ORAL_TABLET | Freq: Two times a day (BID) | ORAL | 0 refills | Status: AC

## 2016-02-18 NOTE — Progress Notes (Signed)
Pre visit review using our clinic review tool, if applicable. No additional management support is needed unless otherwise documented below in the visit note. 

## 2016-02-18 NOTE — Progress Notes (Signed)
Chief Complaint  Patient presents with  . Cough    Since 02/10/2016  . Nasal Congestion    Since 02/10/2016. Some PND and green discharge.    [name] [name] here for URI complaints.  Duration: 8 days  Associated symptoms: sinus congestion, rhinorrhea and cough due to PND Denies: subjective fever, itchy watery eyes, ear pain, ear drainage, sore throat, shortness of breath and myalgia Treatment to date: Mucinex, Pseudophed, Nyquil Sick contacts: No  ROS:  Const: Denies fevers HEENT: As noted in HPI Lungs: No SOB  Past Medical History:  Diagnosis Date  . Back pain 06/13/2015  . Diabetes mellitus    started Metformin 12/2012  . History of shingles 2012   back  . Hyperlipidemia   . Hypertension   . Hypothyroidism 07/29/2010   S/p FNA showing non-neoplastic goiter 7/12 w/ Dr Pollyann Kennedyosen    Family History  Problem Relation Age of Onset  . Hypertension Mother   . Diabetes Mother   . Breast cancer Mother [id number]  . Other Mother   . Hypertension Father   . Cancer Father     throat  . Alcohol abuse Father   . Hypertension Brother   . Heart disease Brother     heart murmur  . Alcohol abuse Brother   . Cancer Maternal Aunt   . Cancer Maternal Grandmother   . Heart disease Maternal Grandfather   . Heart disease Paternal Grandmother     BP 134/72 (BP Location: Right Arm, Patient Position: Sitting, Cuff Size: Large)   Pulse 72   Temp 98.1 F (36.7 C) (Oral)   Wt 203 lb (92.1 kg)   SpO2 100% Comment: RA  BMI 31.79 kg/m  General: Awake, alert, appears stated age HEENT: AT, , ears patent b/l and TM's neg, nares patent w/o discharge, mild TTP over max sinuses b/l, pharynx pink and without exudates, MMM Neck: No masses or asymmetry Heart: RRR, no murmurs, no bruits Lungs: CTAB, no accessory muscle use Psych: Age appropriate judgment and insight, normal mood and affect  Acute non-recurrent maxillary sinusitis - Plan: amoxicillin-clavulanate (AUGMENTIN) 875-125 MG tablet  Orders  as above. Wait for 2 days if symptoms worsen or wait 6 days if symptoms fail to improve. Recommended oral antihistamines in addition to intranasal steroid for symptom relief. Continue to push fluids and practice good hand hygiene. F/u in 1 week if symptoms worsen or fail to improve. Pt voiced understanding and agreement to the plan.  Jilda Rocheicholas Paul AlmyraWendling, DO 02/18/16 9:18 AM

## 2016-02-18 NOTE — Patient Instructions (Signed)
If you are getting worse by day 10, take the abx. If you are not improving by day 14, then take the antibiotic.  Claritin (loratadine), Allegra (fexofenadine), Zyrtec (cetirizine); these are listed in order from weakest to strongest. Generic, and therefore cheaper, options are in the parentheses.   Flonase (fluticasone); nasal spray that is over the counter. 2 sprays each nostril, once daily. Aim towards the same side eye when you spray.  There are available OTC, and the generic versions, which may be cheaper, are in parentheses. Show this to a pharmacist if you have trouble finding any of these items.

## 2016-02-27 ENCOUNTER — Other Ambulatory Visit (INDEPENDENT_AMBULATORY_CARE_PROVIDER_SITE_OTHER): Payer: BLUE CROSS/BLUE SHIELD

## 2016-02-27 DIAGNOSIS — M810 Age-related osteoporosis without current pathological fracture: Secondary | ICD-10-CM | POA: Diagnosis not present

## 2016-02-27 DIAGNOSIS — E785 Hyperlipidemia, unspecified: Secondary | ICD-10-CM | POA: Diagnosis not present

## 2016-02-27 DIAGNOSIS — I10 Essential (primary) hypertension: Secondary | ICD-10-CM

## 2016-02-27 DIAGNOSIS — E118 Type 2 diabetes mellitus with unspecified complications: Secondary | ICD-10-CM | POA: Diagnosis not present

## 2016-02-27 LAB — LIPID PANEL
Cholesterol: 201 mg/dL — ABNORMAL HIGH (ref 0–200)
HDL: 53.9 mg/dL (ref >39.00)
LDL Cholesterol: 126 mg/dL — ABNORMAL HIGH (ref 0–99)
NonHDL: 146.97
TRIGLYCERIDES: 105 mg/dL (ref 0.0–149.0)
Total CHOL/HDL Ratio: 4
VLDL: 21 mg/dL (ref 0.0–40.0)

## 2016-02-27 LAB — CBC
HEMATOCRIT: 42.5 % (ref 36.0–46.0)
Hemoglobin: 14.4 g/dL (ref 12.0–15.0)
MCHC: 33.8 g/dL (ref 30.0–36.0)
MCV: 84.3 fl (ref 78.0–100.0)
Platelets: 276 10*3/uL (ref 150.0–400.0)
RBC: 5.05 Mil/uL (ref 3.87–5.11)
RDW: 12.7 % (ref 11.5–15.5)
WBC: 8.7 10*3/uL (ref 4.0–10.5)

## 2016-02-27 LAB — COMPREHENSIVE METABOLIC PANEL
ALBUMIN: 4.5 g/dL (ref 3.5–5.2)
ALT: 19 U/L (ref 0–35)
AST: 15 U/L (ref 0–37)
Alkaline Phosphatase: 70 U/L (ref 39–117)
BUN: 25 mg/dL — AB (ref 6–23)
CALCIUM: 9.8 mg/dL (ref 8.4–10.5)
CO2: 31 meq/L (ref 19–32)
CREATININE: 0.88 mg/dL (ref 0.40–1.20)
Chloride: 104 mEq/L (ref 96–112)
GFR: 69.19 mL/min (ref >60.00)
Glucose, Bld: 122 mg/dL — ABNORMAL HIGH (ref 70–99)
POTASSIUM: 4.7 meq/L (ref 3.5–5.1)
SODIUM: 142 meq/L (ref 135–145)
Total Bilirubin: 0.4 mg/dL (ref 0.2–1.2)
Total Protein: 7 g/dL (ref 6.0–8.3)

## 2016-02-27 LAB — TSH: TSH: 1.56 u[IU]/mL (ref 0.35–4.50)

## 2016-02-27 LAB — MICROALBUMIN / CREATININE URINE RATIO
Creatinine,U: 100.7 mg/dL
MICROALB UR: 0.9 mg/dL (ref 0.0–1.9)
Microalb Creat Ratio: 0.9 mg/g (ref 0.0–30.0)

## 2016-02-27 LAB — VITAMIN D 25 HYDROXY (VIT D DEFICIENCY, FRACTURES): VITD: 17.76 ng/mL — ABNORMAL LOW (ref 30.00–100.00)

## 2016-02-27 LAB — HEMOGLOBIN A1C: HEMOGLOBIN A1C: 6.9 % — AB (ref 4.6–6.5)

## 2016-03-02 MED ORDER — VITAMIN D (ERGOCALCIFEROL) 1.25 MG (50000 UNIT) PO CAPS: 50000.0000 [IU] | ORAL_CAPSULE | ORAL | 4 refills | Status: DC

## 2016-04-19 DIAGNOSIS — H2513 Age-related nuclear cataract, bilateral: Secondary | ICD-10-CM | POA: Diagnosis not present

## 2016-04-19 DIAGNOSIS — E119 Type 2 diabetes mellitus without complications: Secondary | ICD-10-CM | POA: Diagnosis not present

## 2016-05-27 ENCOUNTER — Other Ambulatory Visit: Payer: Self-pay | Admitting: Family Medicine

## 2016-05-27 DIAGNOSIS — Z1231 Encounter for screening mammogram for malignant neoplasm of breast: Secondary | ICD-10-CM

## 2016-06-28 ENCOUNTER — Ambulatory Visit
Admission: RE | Admit: 2016-06-28 | Discharge: 2016-06-28 | Disposition: A | Discharge status: Home / Self Care | Payer: BLUE CROSS/BLUE SHIELD | Source: Ambulatory Visit | Attending: Family Medicine | Admitting: Family Medicine

## 2016-06-28 DIAGNOSIS — Z1231 Encounter for screening mammogram for malignant neoplasm of breast: Secondary | ICD-10-CM | POA: Diagnosis not present

## 2016-06-30 ENCOUNTER — Telehealth: Payer: Self-pay | Admitting: Family Medicine

## 2016-06-30 NOTE — Telephone Encounter (Signed)
°  Relation to XB:MWUXpt:self Call back number:[phone]   Reason for call:  Patient sent My Chart message reflecting message below, please update patient chart as per patient request.     Reason: To address the following health maintenance concerns.  Ophthalmology Exam    Comments:  I had a eye exam with Dr. Jethro BolusMark Shapiro on March 5th 2018. They were suppose to send a report to Dr. Abner GreenspanBlyth.  Please update my record for the eye exam.    Thank,  Timmothy SoursPatricia Campanile

## 2016-07-30 ENCOUNTER — Ambulatory Visit: Payer: BLUE CROSS/BLUE SHIELD | Admitting: Family Medicine

## 2016-08-06 ENCOUNTER — Encounter: Payer: Self-pay | Admitting: Family Medicine

## 2016-08-06 ENCOUNTER — Ambulatory Visit (INDEPENDENT_AMBULATORY_CARE_PROVIDER_SITE_OTHER): Payer: BLUE CROSS/BLUE SHIELD | Admitting: Family Medicine

## 2016-08-06 VITALS — BP 120/70 | HR 64 | Temp 98.0°F | Resp 18 | Ht 67.0 in | Wt 213.4 lb

## 2016-08-06 DIAGNOSIS — E559 Vitamin D deficiency, unspecified: Secondary | ICD-10-CM | POA: Diagnosis not present

## 2016-08-06 DIAGNOSIS — E038 Other specified hypothyroidism: Secondary | ICD-10-CM | POA: Diagnosis not present

## 2016-08-06 DIAGNOSIS — E118 Type 2 diabetes mellitus with unspecified complications: Secondary | ICD-10-CM | POA: Diagnosis not present

## 2016-08-06 DIAGNOSIS — E669 Obesity, unspecified: Secondary | ICD-10-CM

## 2016-08-06 DIAGNOSIS — I10 Essential (primary) hypertension: Secondary | ICD-10-CM | POA: Diagnosis not present

## 2016-08-06 DIAGNOSIS — M810 Age-related osteoporosis without current pathological fracture: Secondary | ICD-10-CM

## 2016-08-06 DIAGNOSIS — E785 Hyperlipidemia, unspecified: Secondary | ICD-10-CM | POA: Diagnosis not present

## 2016-08-06 DIAGNOSIS — M8580 Other specified disorders of bone density and structure, unspecified site: Secondary | ICD-10-CM | POA: Diagnosis not present

## 2016-08-06 HISTORY — DX: Vitamin D deficiency, unspecified: E55.9

## 2016-08-06 HISTORY — DX: Age-related osteoporosis without current pathological fracture: M81.0

## 2016-08-06 HISTORY — DX: Obesity, unspecified: E66.9

## 2016-08-06 LAB — LIPID PANEL
CHOL/HDL RATIO: 3
Cholesterol: 198 mg/dL (ref 0–200)
HDL: 60.1 mg/dL (ref >39.00)
LDL CALC: 118 mg/dL — AB (ref 0–99)
NONHDL: 137.66
TRIGLYCERIDES: 99 mg/dL (ref 0.0–149.0)
VLDL: 19.8 mg/dL (ref 0.0–40.0)

## 2016-08-06 LAB — CBC
HCT: 44.2 % (ref 36.0–46.0)
Hemoglobin: 14.9 g/dL (ref 12.0–15.0)
MCHC: 33.6 g/dL (ref 30.0–36.0)
MCV: 85.9 fl (ref 78.0–100.0)
Platelets: 253 10*3/uL (ref 150.0–400.0)
RBC: 5.15 Mil/uL — ABNORMAL HIGH (ref 3.87–5.11)
RDW: 13 % (ref 11.5–15.5)
WBC: 7.7 10*3/uL (ref 4.0–10.5)

## 2016-08-06 LAB — COMPREHENSIVE METABOLIC PANEL
ALT: 18 U/L (ref 0–35)
AST: 16 U/L (ref 0–37)
Albumin: 4.8 g/dL (ref 3.5–5.2)
Alkaline Phosphatase: 63 U/L (ref 39–117)
BUN: 16 mg/dL (ref 6–23)
CALCIUM: 9.7 mg/dL (ref 8.4–10.5)
CHLORIDE: 101 meq/L (ref 96–112)
CO2: 28 meq/L (ref 19–32)
CREATININE: 0.81 mg/dL (ref 0.40–1.20)
GFR: 76.02 mL/min (ref >60.00)
GLUCOSE: 144 mg/dL — AB (ref 70–99)
Potassium: 3.8 mEq/L (ref 3.5–5.1)
SODIUM: 139 meq/L (ref 135–145)
Total Bilirubin: 0.5 mg/dL (ref 0.2–1.2)
Total Protein: 6.9 g/dL (ref 6.0–8.3)

## 2016-08-06 LAB — HEMOGLOBIN A1C: HEMOGLOBIN A1C: 7.3 % — AB (ref 4.6–6.5)

## 2016-08-06 LAB — VITAMIN D 25 HYDROXY (VIT D DEFICIENCY, FRACTURES): VITD: 27.67 ng/mL — ABNORMAL LOW (ref 30.00–100.00)

## 2016-08-06 LAB — TSH: TSH: 1.34 u[IU]/mL (ref 0.35–4.50)

## 2016-08-06 MED ORDER — LISINOPRIL 2.5 MG PO TABS: 2.5000 mg | ORAL_TABLET | Freq: Every day | ORAL | 2 refills | Status: DC

## 2016-08-06 MED ORDER — LEVOTHYROXINE SODIUM 75 MCG PO TABS: 75.0000 ug | ORAL_TABLET | Freq: Every day | ORAL | 1 refills | Status: DC

## 2016-08-06 MED ORDER — GLIPIZIDE ER 5 MG PO TB24: 5.0000 mg | ORAL_TABLET | Freq: Two times a day (BID) | ORAL | 1 refills | Status: DC

## 2016-08-06 MED ORDER — AMLODIPINE BESYLATE 5 MG PO TABS: 5.0000 mg | ORAL_TABLET | Freq: Every day | ORAL | 1 refills | Status: DC

## 2016-08-06 MED ORDER — VITAMIN D (ERGOCALCIFEROL) 1.25 MG (50000 UNIT) PO CAPS: 50000.0000 [IU] | ORAL_CAPSULE | ORAL | 4 refills | Status: DC

## 2016-08-06 NOTE — Assessment & Plan Note (Signed)
hgba1c acceptable, minimize simple carbs. Increase exercise as tolerated. Continue current meds 

## 2016-08-06 NOTE — Assessment & Plan Note (Signed)
Encouraged daily vitamin D and monitor

## 2016-08-06 NOTE — Patient Instructions (Addendum)
Bone density shows osteopenia, which is thinner than normal but not as bad as osteoporosis.  Shingrix is the new shingles shot call insurance company to see if they pay if not let us know Carbohydrate Counting for Diabetes Mellitus, Adult Carbohydrate counting is a method for keeping track of how many carbohydrates you eat. Eating carbohydrates naturally increases the amount of sugar (glucose) in the blood. Counting how many carbohydrates you eat helps keep your blood glucose within normal limits, which helps you manage your diabetes (diabetes mellitus). It is important to know how many carbohydrates you can safely have in each meal. This is different for every person. A diet and nutrition specialist (registered dietitian) can help you make a meal plan and calculate how many carbohydrates you should have at each meal and snack. Carbohydrates are found in the following foods:  Grains, such as breads and cereals.  Dried beans and soy products.  Starchy vegetables, such as potatoes, peas, and corn.  Fruit and fruit juices.  Milk and yogurt.  Sweets and snack foods, such as cake, cookies, candy, chips, and soft drinks.  How do I count carbohydrates? There are two ways to count carbohydrates in food. You can use either of the methods or a combination of both. Reading "Nutrition Facts" on packaged food The "Nutrition Facts" list is included on the labels of almost all packaged foods and beverages in the [place] It includes:  The serving size.  Information about nutrients in each serving, including the grams (g) of carbohydrate per serving.  To use the "Nutrition Facts":  Decide how many servings you will have.  Multiply the number of servings by the number of carbohydrates per serving.  The resulting number is the total amount of carbohydrates that you will be having.  Learning standard serving sizes of other foods When you eat foods containing carbohydrates that are not packaged or do  not include "Nutrition Facts" on the label, you need to measure the servings in order to count the amount of carbohydrates:  Measure the foods that you will eat with a food scale or measuring cup, if needed.  Decide how many standard-size servings you will eat.  Multiply the number of servings by 15. Most carbohydrate-rich foods have about 15 g of carbohydrates per serving. ? For example, if you eat 8 oz (170 g) of strawberries, you will have eaten 2 servings and 30 g of carbohydrates (2 servings x 15 g = 30 g).  For foods that have more than one food mixed, such as soups and casseroles, you must count the carbohydrates in each food that is included.  The following list contains standard serving sizes of common carbohydrate-rich foods. Each of these servings has about 15 g of carbohydrates:   hamburger bun or  English muffin.   oz (15 mL) syrup.   oz (14 g) jelly.  1 slice of bread.  1 six-inch tortilla.  3 oz (85 g) cooked rice or pasta.  4 oz (113 g) cooked dried beans.  4 oz (113 g) starchy vegetable, such as peas, corn, or potatoes.  4 oz (113 g) hot cereal.  4 oz (113 g) mashed potatoes or  of a large baked potato.  4 oz (113 g) canned or frozen fruit.  4 oz (120 mL) fruit juice.  4-6 crackers.  6 chicken nuggets.  6 oz (170 g) unsweetened dry cereal.  6 oz (170 g) plain fat-free yogurt or yogurt sweetened with artificial sweeteners.  8 oz (240 mL)  milk.  8 oz (170 g) fresh fruit or one small piece of fruit.  24 oz (680 g) popped popcorn.  Example of carbohydrate counting Sample meal  3 oz (85 g) chicken breast.  6 oz (170 g) brown rice.  4 oz (113 g) corn.  8 oz (240 mL) milk.  8 oz (170 g) strawberries with sugar-free whipped topping. Carbohydrate calculation 1. Identify the foods that contain carbohydrates: ? Rice. ? Corn. ? Milk. ? Strawberries. 2. Calculate how many servings you have of each food: ? 2 servings rice. ? 1 serving  corn. ? 1 serving milk. ? 1 serving strawberries. 3. Multiply each number of servings by 15 g: ? 2 servings rice x 15 g = 30 g. ? 1 serving corn x 15 g = 15 g. ? 1 serving milk x 15 g = 15 g. ? 1 serving strawberries x 15 g = 15 g. 4. Add together all of the amounts to find the total grams of carbohydrates eaten: ? 30 g + 15 g + 15 g + 15 g = 75 g of carbohydrates total. This information is not intended to replace advice given to you by your health care provider. Make sure you discuss any questions you have with your health care provider. Document Released: 02/01/2005 Document Revised: 08/22/2015 Document Reviewed: 07/16/2015 Elsevier Interactive Patient Education  2018 [employer] Recommend calcium intake of 1200 to 1500 mg daily, divided into roughly 3 doses. Best source is the diet and a single dairy serving is about 500 mg, a supplement of calcium citrate once or twice daily to balance diet is fine if not getting enough in diet. Also need Vitamin D 2000 IU caps, 1 cap daily if not already taking vitamin D. Also recommend weight baring exercise on hips and upper body to keep bones strong

## 2016-08-06 NOTE — Assessment & Plan Note (Signed)
On Levothyroxine, continue to monitor 

## 2016-08-06 NOTE — Assessment & Plan Note (Signed)
Encouraged heart healthy diet, increase exercise, avoid trans fats, consider a krill oil cap daily 

## 2016-08-06 NOTE — Assessment & Plan Note (Signed)
Encouraged to get adequate exercise, calcium and vitamin d intake 

## 2016-08-06 NOTE — Assessment & Plan Note (Signed)
Encouraged DASH diet, decrease po intake and increase exercise as tolerated. Needs 7-8 hours of sleep nightly. Avoid trans fats, eat small, frequent meals every 4-5 hours with lean proteins, complex carbs and healthy fats. Minimize simple carbs, bariatric referral 

## 2016-08-06 NOTE — Progress Notes (Signed)
Subjective:  I acted as a Neurosurgeonscribe for Dr. Abner GreenspanBlyth. Princess, [place]MA  Patient ID: [name] [name], female    DOB: Feb 20, 1955, 61 y.o.   MRN: [id number]  No chief complaint on file.   HPI  Patient is in today for a 6 month follow up on multiple medical concerns including diabetes, hyperlipidemia, hypertension, obesity, osteoporosis. She has been noting some persistent fatigue. Denies CP/palp/SOB/HA/congestion/fevers/GI or GU c/o. Taking meds as prescribed  Patient Care Team: Bradd CanaryBlyth, Stacey A, MD as PCP - General (Family Medicine) Dorena CookeyHayes, John, MD as Consulting Physician (Gastroenterology)   Past Medical History:  Diagnosis Date  . Back pain 06/13/2015  . Diabetes mellitus    started Metformin 12/2012  . History of shingles 2012   back  . Hyperlipidemia   . Hypertension   . Hypothyroidism 07/29/2010   S/p FNA showing non-neoplastic goiter 7/12 w/ Dr Pollyann Kennedyosen   . Obesity 08/06/2016  . Osteoporosis 08/06/2016  . Vitamin D deficiency 08/06/2016    Past Surgical History:  Procedure Laterality Date  . ABDOMINAL HYSTERECTOMY    . APPENDECTOMY    . MASS EXCISION  03/02/2011   Procedure: EXCISION MASS;  Surgeon: Nicki ReaperGary R Kuzma, MD;  Location: [facility];  Service: Orthopedics;  Laterality: Right;  Excision Cyst Interphangeal Right Thumb  . OOPHORECTOMY    . SHOULDER ARTHROSCOPY W/ ROTATOR CUFF REPAIR  2007   rt  . TONSILLECTOMY    . TRIGGER FINGER RELEASE  11/01/2011   Procedure: RELEASE TRIGGER FINGER/A-1 PULLEY;  Surgeon: Nicki ReaperGary R Kuzma, MD;  Location: [facility];  Service: Orthopedics;  Laterality: Right;  right thumb, possible excision cyst    Family History  Problem Relation Age of Onset  . Hypertension Mother   . Diabetes Mother   . Breast cancer Mother [id number]  . Other Mother   . Hypertension Father   . Cancer Father        throat  . Alcohol abuse Father   . Hypertension Brother   . Heart disease Brother        heart murmur  . Alcohol abuse Brother   .  Cancer Maternal Grandmother   . Heart disease Maternal Grandfather   . Heart disease Paternal Grandmother   . Cancer Maternal Aunt   . Breast cancer Maternal Aunt     Social History   Social History  . Marital status: Married    Spouse name: N/A  . Number of children: N/A  . Years of education: N/A   Occupational History  . office administrator    Social History Main Topics  . Smoking status: Never Smoker  . Smokeless tobacco: Not on file  . Alcohol use No  . Drug use: No  . Sexual activity: Yes    Partners: Male     Comment: lives with husband, works at [employer], no major dietary    Other Topics Concern  . Not on file   Social History Narrative   Married; step daughter   Regular exercise: walk 2 days a week   Caffeine use: 2 cups of coffee daily    Outpatient Medications Prior to Visit  Medication Sig Dispense Refill  . Ascorbic Acid (VITAMIN C) 1000 MG tablet Take 1,000 mg by mouth daily.      Marland Kitchen. aspirin 81 MG EC tablet Take 81 mg by mouth daily.      . calcium carbonate (CALCIUM 500) 1250 MG tablet Take 1 tablet by mouth daily.      .Marland Kitchen  Multiple Vitamin (MULTIVITAMIN) tablet Take 1 tablet by mouth daily.      . Omega-3 Fatty Acids (FISH OIL PO) Take by mouth daily.      Marland Kitchen amLODipine (NORVASC) 5 MG tablet Take 1 tablet (5 mg total) by mouth daily. 90 tablet 1  . glipiZIDE (GLUCOTROL XL) 5 MG 24 hr tablet Take 1 tablet (5 mg total) by mouth 2 (two) times daily. 180 tablet 1  . levothyroxine (SYNTHROID, LEVOTHROID) 75 MCG tablet Take 1 tablet (75 mcg total) by mouth daily before breakfast. 90 tablet 1  . lisinopril (ZESTRIL) 2.5 MG tablet Take 1 tablet (2.5 mg total) by mouth daily. 90 tablet 2  . Vitamin D, Ergocalciferol, (DRISDOL) 50000 units CAPS capsule Take 1 capsule (50,000 Units total) by mouth every 7 (seven) days. 4 capsule 4   No facility-administered medications prior to visit.     Allergies  Allergen Reactions  . Codeine   . Tramadol     Review  of Systems  Constitutional: Positive for malaise/fatigue. Negative for chills and fever.  HENT: Negative for congestion and hearing loss.   Eyes: Negative for blurred vision and discharge.  Respiratory: Negative for cough, sputum production and shortness of breath.   Cardiovascular: Negative for chest pain, palpitations and leg swelling.  Gastrointestinal: Negative for abdominal pain, blood in stool, constipation, diarrhea, heartburn, nausea and vomiting.  Genitourinary: Negative for dysuria, frequency, hematuria and urgency.  Musculoskeletal: Negative for back pain, falls and myalgias.  Skin: Negative for rash.  Neurological: Negative for dizziness, sensory change, loss of consciousness, weakness and headaches.  Endo/Heme/Allergies: Negative for environmental allergies. Does not bruise/bleed easily.  Psychiatric/Behavioral: Negative for depression and suicidal ideas. The patient is not nervous/anxious and does not have insomnia.        Objective:    Physical Exam  Constitutional: She is oriented to person, place, and time. She appears well-developed and well-nourished. No distress.  HENT:  Head: Normocephalic and atraumatic.  Nose: Nose normal.  Eyes: Right eye exhibits no discharge. Left eye exhibits no discharge.  Neck: Normal range of motion. Neck supple.  Cardiovascular: Normal rate and regular rhythm.   No murmur heard. Pulmonary/Chest: Effort normal and breath sounds normal.  Abdominal: Soft. Bowel sounds are normal. There is no tenderness.  Musculoskeletal: She exhibits no edema.  Neurological: She is alert and oriented to person, place, and time.  Skin: Skin is warm and dry.  Psychiatric: She has a normal mood and affect.  Vitals reviewed.   BP 120/70 (BP Location: Left Arm, Patient Position: Sitting, Cuff Size: Normal)   Pulse 64   Temp 98 F (36.7 C) (Oral)   Resp 18   Ht 5\' 7"  (1.702 m)   Wt 213 lb 6.4 oz (96.8 kg)   SpO2 98%   BMI 33.42 kg/m  Wt Readings  from Last 3 Encounters:  08/06/16 213 lb 6.4 oz (96.8 kg)  02/18/16 203 lb (92.1 kg)  01/27/16 202 lb 6.4 oz (91.8 kg)   BP Readings from Last 3 Encounters:  08/06/16 120/70  02/18/16 134/72  01/27/16 126/78     Immunization History  Administered Date(s) Administered  . Influenza,inj,Quad PF,36+ Mos 12/05/2012, 12/13/2013, 03/11/2015  . Pneumococcal Conjugate-13 06/13/2015  . Pneumococcal Polysaccharide-23 02/15/2009  . Td 02/16/2007  . Zoster 06/13/2015    Health Maintenance  Topic Date Due  . HIV Screening  02/01/1969  . PNEUMOCOCCAL POLYSACCHARIDE VACCINE (2) 02/15/2014  . FOOT EXAM  09/10/2015  . HEMOGLOBIN A1C  08/26/2016  .  INFLUENZA VACCINE  09/15/2016  . TETANUS/TDAP  02/15/2017  . OPHTHALMOLOGY EXAM  04/19/2017  . MAMMOGRAM  06/29/2018  . COLONOSCOPY  08/28/2018  . Hepatitis C Screening  Completed    Lab Results  Component Value Date   WBC 8.7 02/27/2016   HGB 14.4 02/27/2016   HCT 42.5 02/27/2016   PLT 276.0 02/27/2016   GLUCOSE 122 (H) 02/27/2016   CHOL 201 (H) 02/27/2016   TRIG 105.0 02/27/2016   HDL 53.90 02/27/2016   LDLCALC 126 (H) 02/27/2016   ALT 19 02/27/2016   AST 15 02/27/2016   NA 142 02/27/2016   K 4.7 02/27/2016   CL 104 02/27/2016   CREATININE 0.88 02/27/2016   BUN 25 (H) 02/27/2016   CO2 31 02/27/2016   TSH 1.56 02/27/2016   HGBA1C 6.9 (H) 02/27/2016   MICROALBUR 0.9 02/27/2016    Lab Results  Component Value Date   TSH 1.56 02/27/2016   Lab Results  Component Value Date   WBC 8.7 02/27/2016   HGB 14.4 02/27/2016   HCT 42.5 02/27/2016   MCV 84.3 02/27/2016   PLT 276.0 02/27/2016   Lab Results  Component Value Date   NA 142 02/27/2016   K 4.7 02/27/2016   CO2 31 02/27/2016   GLUCOSE 122 (H) 02/27/2016   BUN 25 (H) 02/27/2016   CREATININE 0.88 02/27/2016   BILITOT 0.4 02/27/2016   ALKPHOS 70 02/27/2016   AST 15 02/27/2016   ALT 19 02/27/2016   PROT 7.0 02/27/2016   ALBUMIN 4.5 02/27/2016   CALCIUM 9.8 02/27/2016    GFR 69.19 02/27/2016   Lab Results  Component Value Date   CHOL 201 (H) 02/27/2016   Lab Results  Component Value Date   HDL 53.90 02/27/2016   Lab Results  Component Value Date   LDLCALC 126 (H) 02/27/2016   Lab Results  Component Value Date   TRIG 105.0 02/27/2016   Lab Results  Component Value Date   CHOLHDL 4 02/27/2016   Lab Results  Component Value Date   HGBA1C 6.9 (H) 02/27/2016         Assessment & Plan:   Problem List Items Addressed This Visit    Essential hypertension    Well controlled, no changes to meds. Encouraged heart healthy diet such as the DASH diet and exercise as tolerated.       Relevant Medications   lisinopril (ZESTRIL) 2.5 MG tablet   amLODipine (NORVASC) 5 MG tablet   Hypothyroidism    On Levothyroxine, continue to monitor      Relevant Medications   levothyroxine (SYNTHROID, LEVOTHROID) 75 MCG tablet   DM type 2 (diabetes mellitus, type 2) (HCC) - Primary    hgba1c acceptable, minimize simple carbs. Increase exercise as tolerated. Continue current meds      Relevant Medications   lisinopril (ZESTRIL) 2.5 MG tablet   glipiZIDE (GLUCOTROL XL) 5 MG 24 hr tablet   Hyperlipidemia with target LDL less than 100    Encouraged heart healthy diet, increase exercise, avoid trans fats, consider a krill oil cap daily      Relevant Medications   lisinopril (ZESTRIL) 2.5 MG tablet   amLODipine (NORVASC) 5 MG tablet   Osteoporosis, post-menopausal    Encouraged to get adequate exercise, calcium and vitamin d intake      Vitamin D deficiency    Encouraged daily vitamin D and monitor      Obesity    Encouraged DASH diet, decrease po intake and increase exercise  as tolerated. Needs 7-8 hours of sleep nightly. Avoid trans fats, eat small, frequent meals every 4-5 hours with lean proteins, complex carbs and healthy fats. Minimize simple carbs, bariatric referral      Relevant Medications   glipiZIDE (GLUCOTROL XL) 5 MG 24 hr tablet        I have discontinued Ms. Parish's Vitamin D (Ergocalciferol). I am also having her maintain her aspirin, calcium carbonate, Omega-3 Fatty Acids (FISH OIL PO), multivitamin, vitamin C, lisinopril, amLODipine, glipiZIDE, and levothyroxine.  Meds ordered this encounter  Medications  . lisinopril (ZESTRIL) 2.5 MG tablet    Sig: Take 1 tablet (2.5 mg total) by mouth daily.    Dispense:  90 tablet    Refill:  2  . amLODipine (NORVASC) 5 MG tablet    Sig: Take 1 tablet (5 mg total) by mouth daily.    Dispense:  90 tablet    Refill:  1  . glipiZIDE (GLUCOTROL XL) 5 MG 24 hr tablet    Sig: Take 1 tablet (5 mg total) by mouth 2 (two) times daily.    Dispense:  180 tablet    Refill:  1  . levothyroxine (SYNTHROID, LEVOTHROID) 75 MCG tablet    Sig: Take 1 tablet (75 mcg total) by mouth daily before breakfast.    Dispense:  90 tablet    Refill:  1    CMA served as scribe during this visit. History, Physical and Plan performed by medical provider. Documentation and orders reviewed and attested to.  Danise Edge, MD

## 2016-08-06 NOTE — Assessment & Plan Note (Signed)
Well controlled, no changes to meds. Encouraged heart healthy diet such as the DASH diet and exercise as tolerated.  °

## 2016-08-09 DIAGNOSIS — L570 Actinic keratosis: Secondary | ICD-10-CM | POA: Diagnosis not present

## 2016-08-09 DIAGNOSIS — D1800 Hemangioma unspecified site: Secondary | ICD-10-CM | POA: Diagnosis not present

## 2016-08-09 DIAGNOSIS — D2262 Melanocytic nevi of left upper limb, including shoulder: Secondary | ICD-10-CM | POA: Diagnosis not present

## 2016-08-09 DIAGNOSIS — D2362 Other benign neoplasm of skin of left upper limb, including shoulder: Secondary | ICD-10-CM | POA: Diagnosis not present

## 2016-08-09 DIAGNOSIS — L900 Lichen sclerosus et atrophicus: Secondary | ICD-10-CM | POA: Diagnosis not present

## 2016-08-09 DIAGNOSIS — L821 Other seborrheic keratosis: Secondary | ICD-10-CM | POA: Diagnosis not present

## 2016-08-09 DIAGNOSIS — D2271 Melanocytic nevi of right lower limb, including hip: Secondary | ICD-10-CM | POA: Diagnosis not present

## 2016-08-09 DIAGNOSIS — D1801 Hemangioma of skin and subcutaneous tissue: Secondary | ICD-10-CM | POA: Diagnosis not present

## 2016-08-12 ENCOUNTER — Ambulatory Visit (HOSPITAL_BASED_OUTPATIENT_CLINIC_OR_DEPARTMENT_OTHER)
Admission: RE | Admit: 2016-08-12 | Discharge: 2016-08-12 | Disposition: A | Discharge status: Home / Self Care | Payer: BLUE CROSS/BLUE SHIELD | Source: Ambulatory Visit | Attending: Family Medicine | Admitting: Family Medicine

## 2016-08-12 DIAGNOSIS — M8580 Other specified disorders of bone density and structure, unspecified site: Secondary | ICD-10-CM | POA: Diagnosis not present

## 2016-08-12 DIAGNOSIS — M818 Other osteoporosis without current pathological fracture: Secondary | ICD-10-CM | POA: Diagnosis not present

## 2016-08-12 DIAGNOSIS — M85852 Other specified disorders of bone density and structure, left thigh: Secondary | ICD-10-CM | POA: Insufficient documentation

## 2016-08-12 DIAGNOSIS — M810 Age-related osteoporosis without current pathological fracture: Secondary | ICD-10-CM | POA: Insufficient documentation

## 2016-10-19 ENCOUNTER — Other Ambulatory Visit: Payer: Self-pay | Admitting: Family Medicine

## 2017-01-18 ENCOUNTER — Other Ambulatory Visit: Payer: Self-pay | Admitting: Family Medicine

## 2017-01-18 MED ORDER — LISINOPRIL 2.5 MG PO TABS: 2.5000 mg | ORAL_TABLET | Freq: Every day | ORAL | 0 refills | Status: DC

## 2017-02-21 ENCOUNTER — Encounter: Payer: Self-pay | Admitting: Family Medicine

## 2017-02-21 ENCOUNTER — Ambulatory Visit (INDEPENDENT_AMBULATORY_CARE_PROVIDER_SITE_OTHER): Payer: BLUE CROSS/BLUE SHIELD | Admitting: Family Medicine

## 2017-02-21 VITALS — BP 136/82 | HR 72 | Temp 97.8°F | Resp 18 | Ht 67.0 in | Wt 208.0 lb

## 2017-02-21 DIAGNOSIS — E038 Other specified hypothyroidism: Secondary | ICD-10-CM

## 2017-02-21 DIAGNOSIS — E785 Hyperlipidemia, unspecified: Secondary | ICD-10-CM

## 2017-02-21 DIAGNOSIS — M810 Age-related osteoporosis without current pathological fracture: Secondary | ICD-10-CM | POA: Diagnosis not present

## 2017-02-21 DIAGNOSIS — Z0000 Encounter for general adult medical examination without abnormal findings: Secondary | ICD-10-CM | POA: Diagnosis not present

## 2017-02-21 DIAGNOSIS — E669 Obesity, unspecified: Secondary | ICD-10-CM | POA: Diagnosis not present

## 2017-02-21 DIAGNOSIS — I10 Essential (primary) hypertension: Secondary | ICD-10-CM | POA: Diagnosis not present

## 2017-02-21 DIAGNOSIS — E118 Type 2 diabetes mellitus with unspecified complications: Secondary | ICD-10-CM | POA: Diagnosis not present

## 2017-02-21 DIAGNOSIS — Z23 Encounter for immunization: Secondary | ICD-10-CM | POA: Diagnosis not present

## 2017-02-21 LAB — CBC
HEMATOCRIT: 43.7 % (ref 36.0–46.0)
Hemoglobin: 14.4 g/dL (ref 12.0–15.0)
MCHC: 33 g/dL (ref 30.0–36.0)
MCV: 87.1 fl (ref 78.0–100.0)
Platelets: 251 10*3/uL (ref 150.0–400.0)
RBC: 5.02 Mil/uL (ref 3.87–5.11)
RDW: 13 % (ref 11.5–15.5)
WBC: 6.6 10*3/uL (ref 4.0–10.5)

## 2017-02-21 LAB — COMPREHENSIVE METABOLIC PANEL
ALBUMIN: 4.5 g/dL (ref 3.5–5.2)
ALT: 19 U/L (ref 0–35)
AST: 17 U/L (ref 0–37)
Alkaline Phosphatase: 74 U/L (ref 39–117)
BUN: 12 mg/dL (ref 6–23)
CHLORIDE: 101 meq/L (ref 96–112)
CO2: 28 meq/L (ref 19–32)
CREATININE: 0.68 mg/dL (ref 0.40–1.20)
Calcium: 9.3 mg/dL (ref 8.4–10.5)
GFR: 92.87 mL/min (ref >60.00)
GLUCOSE: 153 mg/dL — AB (ref 70–99)
POTASSIUM: 3.9 meq/L (ref 3.5–5.1)
SODIUM: 138 meq/L (ref 135–145)
Total Bilirubin: 0.6 mg/dL (ref 0.2–1.2)
Total Protein: 6.7 g/dL (ref 6.0–8.3)

## 2017-02-21 LAB — LIPID PANEL
CHOL/HDL RATIO: 3
CHOLESTEROL: 172 mg/dL (ref 0–200)
HDL: 52.8 mg/dL (ref >39.00)
LDL CALC: 97 mg/dL (ref 0–99)
NonHDL: 119.24
Triglycerides: 110 mg/dL (ref 0.0–149.0)
VLDL: 22 mg/dL (ref 0.0–40.0)

## 2017-02-21 LAB — VITAMIN D 25 HYDROXY (VIT D DEFICIENCY, FRACTURES): VITD: 24.51 ng/mL — AB (ref 30.00–100.00)

## 2017-02-21 LAB — TSH: TSH: 1.18 u[IU]/mL (ref 0.35–4.50)

## 2017-02-21 LAB — HEMOGLOBIN A1C: Hgb A1c MFr Bld: 7.6 % — ABNORMAL HIGH (ref 4.6–6.5)

## 2017-02-21 MED ORDER — LEVOTHYROXINE SODIUM 75 MCG PO TABS: 75.0000 ug | ORAL_TABLET | Freq: Every day | ORAL | 1 refills | Status: DC

## 2017-02-21 MED ORDER — LISINOPRIL 2.5 MG PO TABS: 2.5000 mg | ORAL_TABLET | Freq: Every day | ORAL | 1 refills | Status: DC

## 2017-02-21 MED ORDER — AMLODIPINE BESYLATE 5 MG PO TABS: 5.0000 mg | ORAL_TABLET | Freq: Every day | ORAL | 1 refills | Status: DC

## 2017-02-21 MED ORDER — GLIPIZIDE ER 5 MG PO TB24: 5.0000 mg | ORAL_TABLET | Freq: Two times a day (BID) | ORAL | 1 refills | Status: DC

## 2017-02-21 NOTE — Assessment & Plan Note (Signed)
Well controlled, no changes to meds. Encouraged heart healthy diet such as the DASH diet and exercise as tolerated.  °

## 2017-02-21 NOTE — Assessment & Plan Note (Signed)
Encouraged heart healthy diet, increase exercise, avoid trans fats, consider a krill oil cap daily 

## 2017-02-21 NOTE — Assessment & Plan Note (Signed)
On Levothyroxine, continue to monitor 

## 2017-02-21 NOTE — Assessment & Plan Note (Signed)
hgba1c acceptable, minimize simple carbs. Increase exercise as tolerated. Continue current meds 

## 2017-02-21 NOTE — Assessment & Plan Note (Addendum)
Encouraged to get adequate exercise, calcium and vitamin d intake. Failed Fosamax with significant GI upset. Start process to approve Prolia and then will administer.

## 2017-02-21 NOTE — Assessment & Plan Note (Addendum)
Patient encouraged to maintain heart healthy diet, regular exercise, adequate sleep. Consider daily probiotics. Take medications as prescribed. Labs ordered today 

## 2017-02-21 NOTE — Progress Notes (Signed)
Subjective:  I acted as a Neurosurgeonscribe for Dr. Abner GreenspanBlyth. Princess, [place]  Patient ID: [name] [name], female    DOB: May 10, 1955, 62 y.o.   MRN: [id number]  No chief complaint on file.   HPI  Patient is in today for an annual exam and follow up on chronic medical concerns including Diabetes 2, obesity, hyperlipidemia and hypothyroidism. She feels well today but has a great deal of stress lately with a cousin an aunt and an uncle all dying recently. Her cousin was [id number] and died of a stroke. No recent illness or hospitalizations. She is not exercising regularly. Tries to maintain a heart healthy diet. No trouble with ADLs. Denies CP/palp/SOB/HA/congestion/fevers/GI or GU c/o. Taking meds as prescribed  Patient Care Team: Bradd CanaryBlyth, Rosezetta Balderston A, MD as PCP - General (Family Medicine) Dorena CookeyHayes, John, MD as Consulting Physician (Gastroenterology) Arminda ResidesJones, Daniel, MD as Consulting Physician (Dermatology)   Past Medical History:  Diagnosis Date  . Back pain 06/13/2015  . Diabetes mellitus    started Metformin 12/2012  . History of shingles 2012   back  . Hyperlipidemia   . Hypertension   . Hypothyroidism 07/29/2010   S/p FNA showing non-neoplastic goiter 7/12 w/ Dr Pollyann Kennedyosen   . Obesity 08/06/2016  . Osteoporosis 08/06/2016  . Vitamin D deficiency 08/06/2016    Past Surgical History:  Procedure Laterality Date  . ABDOMINAL HYSTERECTOMY  1997  . APPENDECTOMY    . MASS EXCISION  03/02/2011   Procedure: EXCISION MASS;  Surgeon: Nicki ReaperGary R Kuzma, MD;  Location: [facility];  Service: Orthopedics;  Laterality: Right;  Excision Cyst Interphangeal Right Thumb  . OOPHORECTOMY    . SHOULDER ARTHROSCOPY W/ ROTATOR CUFF REPAIR  2007   rt  . TONSILLECTOMY    . TRIGGER FINGER RELEASE  11/01/2011   Procedure: RELEASE TRIGGER FINGER/A-1 PULLEY;  Surgeon: Nicki ReaperGary R Kuzma, MD;  Location:  SURGERY CENTER;  Service: Orthopedics;  Laterality: Right;  right thumb, possible excision cyst    Family History  Problem  Relation Age of Onset  . Hypertension Mother   . Diabetes Mother   . Breast cancer Mother [id number]  . Other Mother   . Hypertension Father   . Cancer Father        throat  . Alcohol abuse Father   . Hypertension Brother   . Heart disease Brother        heart murmur  . Alcohol abuse Brother   . Cancer Maternal Grandmother   . Heart disease Maternal Grandfather   . Heart disease Paternal Grandmother   . Cancer Maternal Aunt   . Breast cancer Maternal Aunt     Social History   Socioeconomic History  . Marital status: Married    Spouse name: Not on file  . Number of children: Not on file  . Years of education: Not on file  . Highest education level: Not on file  Social Needs  . Financial resource strain: Not on file  . Food insecurity - worry: Not on file  . Food insecurity - inability: Not on file  . Transportation needs - medical: Not on file  . Transportation needs - non-medical: Not on file  Occupational History  . Occupation: Physiological scientistoffice administrator  Tobacco Use  . Smoking status: Never Smoker  . Smokeless tobacco: Never Used  Substance and Sexual Activity  . Alcohol use: No  . Drug use: No  . Sexual activity: Yes    Partners: Male    Comment: lives  with husband, works at [employer], no major dietary   Other Topics Concern  . Not on file  Social History Narrative   Married; step daughter   Regular exercise: walk 2 days a week   Caffeine use: 2 cups of coffee daily    Outpatient Medications Prior to Visit  Medication Sig Dispense Refill  . Ascorbic Acid (VITAMIN C) 1000 MG tablet Take 1,000 mg by mouth daily.      Marland Kitchen aspirin 81 MG EC tablet Take 81 mg by mouth daily.      . calcium carbonate (CALCIUM 500) 1250 MG tablet Take 1 tablet by mouth daily.      . Multiple Vitamin (MULTIVITAMIN) tablet Take 1 tablet by mouth daily.      . Omega-3 Fatty Acids (FISH OIL PO) Take by mouth daily.      . Vitamin D, Ergocalciferol, (DRISDOL) 50000 units CAPS capsule Take 1  capsule (50,000 Units total) by mouth every 7 (seven) days. 4 capsule 4  . amLODipine (NORVASC) 5 MG tablet Take 1 tablet (5 mg total) by mouth daily. 90 tablet 1  . glipiZIDE (GLUCOTROL XL) 5 MG 24 hr tablet Take 1 tablet (5 mg total) by mouth 2 (two) times daily. 180 tablet 1  . levothyroxine (SYNTHROID, LEVOTHROID) 75 MCG tablet Take 1 tablet (75 mcg total) by mouth daily before breakfast. 90 tablet 1  . lisinopril (PRINIVIL,ZESTRIL) 2.5 MG tablet Take 1 tablet (2.5 mg total) by mouth daily. 90 tablet 0   No facility-administered medications prior to visit.     Allergies  Allergen Reactions  . Codeine   . Tramadol     Review of Systems  Constitutional: Negative for fever and malaise/fatigue.  HENT: Negative for congestion.   Eyes: Negative for blurred vision.  Respiratory: Negative for cough and shortness of breath.   Cardiovascular: Negative for chest pain, palpitations and leg swelling.  Gastrointestinal: Negative for vomiting.  Musculoskeletal: Negative for back pain.  Skin: Negative for rash.  Neurological: Negative for loss of consciousness and headaches.       Objective:    Physical Exam  Constitutional: She is oriented to person, place, and time. She appears well-developed and well-nourished. No distress.  HENT:  Head: Normocephalic and atraumatic.  Eyes: Conjunctivae are normal.  Neck: Normal range of motion. No thyromegaly present.  Cardiovascular: Normal rate and regular rhythm.  Pulmonary/Chest: Effort normal and breath sounds normal. She has no wheezes.  Abdominal: Soft. Bowel sounds are normal. There is no tenderness.  Musculoskeletal: Normal range of motion. She exhibits no edema or deformity.  Neurological: She is alert and oriented to person, place, and time.  Skin: Skin is warm and dry. She is not diaphoretic.  Psychiatric: She has a normal mood and affect.    BP 136/82 (BP Location: Left Arm, Patient Position: Sitting, Cuff Size: Normal)   Pulse 72    Temp 97.8 F (36.6 C) (Oral)   Resp 18   Ht 5\' 7"  (1.702 m)   Wt 208 lb (94.3 kg)   SpO2 99%   BMI 32.58 kg/m  Wt Readings from Last 3 Encounters:  02/21/17 208 lb (94.3 kg)  08/06/16 213 lb 6.4 oz (96.8 kg)  02/18/16 203 lb (92.1 kg)   BP Readings from Last 3 Encounters:  02/21/17 136/82  08/06/16 120/70  02/18/16 134/72     Immunization History  Administered Date(s) Administered  . Influenza,inj,Quad PF,6+ Mos 12/05/2012, 12/13/2013, 03/11/2015  . Influenza-Unspecified 12/20/2016  . Pneumococcal Conjugate-13  06/13/2015  . Pneumococcal Polysaccharide-23 02/15/2009  . Td 02/16/2007  . Zoster 06/13/2015    Health Maintenance  Topic Date Due  . HIV Screening  02/01/1969  . PNEUMOCOCCAL POLYSACCHARIDE VACCINE (2) 02/15/2014  . FOOT EXAM  09/10/2015  . TETANUS/TDAP  02/15/2017  . HEMOGLOBIN A1C  02/05/2017  . OPHTHALMOLOGY EXAM  04/19/2017  . MAMMOGRAM  06/29/2018  . COLONOSCOPY  08/28/2018  . INFLUENZA VACCINE  Completed  . Hepatitis C Screening  Completed    Lab Results  Component Value Date   WBC 7.7 08/06/2016   HGB 14.9 08/06/2016   HCT 44.2 08/06/2016   PLT 253.0 08/06/2016   GLUCOSE 144 (H) 08/06/2016   CHOL 198 08/06/2016   TRIG 99.0 08/06/2016   HDL 60.10 08/06/2016   LDLCALC 118 (H) 08/06/2016   ALT 18 08/06/2016   AST 16 08/06/2016   NA 139 08/06/2016   K 3.8 08/06/2016   CL 101 08/06/2016   CREATININE 0.81 08/06/2016   BUN 16 08/06/2016   CO2 28 08/06/2016   TSH 1.34 08/06/2016   HGBA1C 7.3 (H) 08/06/2016   MICROALBUR 0.9 02/27/2016    Lab Results  Component Value Date   TSH 1.34 08/06/2016   Lab Results  Component Value Date   WBC 7.7 08/06/2016   HGB 14.9 08/06/2016   HCT 44.2 08/06/2016   MCV 85.9 08/06/2016   PLT 253.0 08/06/2016   Lab Results  Component Value Date   NA 139 08/06/2016   K 3.8 08/06/2016   CO2 28 08/06/2016   GLUCOSE 144 (H) 08/06/2016   BUN 16 08/06/2016   CREATININE 0.81 08/06/2016   BILITOT 0.5  08/06/2016   ALKPHOS 63 08/06/2016   AST 16 08/06/2016   ALT 18 08/06/2016   PROT 6.9 08/06/2016   ALBUMIN 4.8 08/06/2016   CALCIUM 9.7 08/06/2016   GFR 76.02 08/06/2016   Lab Results  Component Value Date   CHOL 198 08/06/2016   Lab Results  Component Value Date   HDL 60.10 08/06/2016   Lab Results  Component Value Date   LDLCALC 118 (H) 08/06/2016   Lab Results  Component Value Date   TRIG 99.0 08/06/2016   Lab Results  Component Value Date   CHOLHDL 3 08/06/2016   Lab Results  Component Value Date   HGBA1C 7.3 (H) 08/06/2016         Assessment & Plan:   Problem List Items Addressed This Visit    Essential hypertension    Well controlled, no changes to meds. Encouraged heart healthy diet such as the DASH diet and exercise as tolerated.       Relevant Medications   lisinopril (PRINIVIL,ZESTRIL) 2.5 MG tablet   amLODipine (NORVASC) 5 MG tablet   Annual physical exam    Patient encouraged to maintain heart healthy diet, regular exercise, adequate sleep. Consider daily probiotics. Take medications as prescribed. Labs ordered today      Hypothyroidism    On Levothyroxine, continue to monitor      Relevant Medications   levothyroxine (SYNTHROID, LEVOTHROID) 75 MCG tablet   DM type 2 (diabetes mellitus, type 2) (HCC)    hgba1c acceptable, minimize simple carbs. Increase exercise as tolerated. Continue current meds      Relevant Medications   lisinopril (PRINIVIL,ZESTRIL) 2.5 MG tablet   glipiZIDE (GLUCOTROL XL) 5 MG 24 hr tablet   Hyperlipidemia with target LDL less than 100    Encouraged heart healthy diet, increase exercise, avoid trans fats, consider a krill  oil cap daily      Relevant Medications   lisinopril (PRINIVIL,ZESTRIL) 2.5 MG tablet   amLODipine (NORVASC) 5 MG tablet   Osteoporosis, post-menopausal    Encouraged to get adequate exercise, calcium and vitamin d intake. Failed Fosamax with significant GI upset. Start process to approve  Prolia and then will administer.       Obesity    Encouraged DASH diet, decrease po intake and increase exercise as tolerated. Needs 7-8 hours of sleep nightly. Avoid trans fats, eat small, frequent meals every 4-5 hours with lean proteins, complex carbs and healthy fats. Minimize simple carbs, bariatric referral      Relevant Medications   glipiZIDE (GLUCOTROL XL) 5 MG 24 hr tablet      I am having [name] [name] maintain her aspirin, calcium carbonate, Omega-3 Fatty Acids (FISH OIL PO), multivitamin, vitamin C, Vitamin D (Ergocalciferol), lisinopril, amLODipine, levothyroxine, and glipiZIDE.  Meds ordered this encounter  Medications  . lisinopril (PRINIVIL,ZESTRIL) 2.5 MG tablet    Sig: Take 1 tablet (2.5 mg total) by mouth daily.    Dispense:  90 tablet    Refill:  1  . amLODipine (NORVASC) 5 MG tablet    Sig: Take 1 tablet (5 mg total) by mouth daily.    Dispense:  90 tablet    Refill:  1  . levothyroxine (SYNTHROID, LEVOTHROID) 75 MCG tablet    Sig: Take 1 tablet (75 mcg total) by mouth daily before breakfast.    Dispense:  90 tablet    Refill:  1  . glipiZIDE (GLUCOTROL XL) 5 MG 24 hr tablet    Sig: Take 1 tablet (5 mg total) by mouth 2 (two) times daily.    Dispense:  180 tablet    Refill:  1    CMA served as scribe during this visit. History, Physical and Plan performed by medical provider. Documentation and orders reviewed and attested to.  Danise Edge, MD

## 2017-02-21 NOTE — Patient Instructions (Addendum)
Preventive Care 40-64 Years, Female Preventive care refers to lifestyle choices and visits with your health care provider that can promote health and wellness. What does preventive care include?  A yearly physical exam. This is also called an annual well check.  Dental exams once or twice a year.  Routine eye exams. Ask your health care provider how often you should have your eyes checked.  Personal lifestyle choices, including: ? Daily care of your teeth and gums. ? Regular physical activity. ? Eating a healthy diet. ? Avoiding tobacco and drug use. ? Limiting alcohol use. ? Practicing safe sex. ? Taking low-dose aspirin daily starting at age 58. ? Taking vitamin and mineral supplements as recommended by your health care provider. What happens during an annual well check? The services and screenings done by your health care provider during your annual well check will depend on your age, overall health, lifestyle risk factors, and family history of disease. Counseling Your health care provider may ask you questions about your:  Alcohol use.  Tobacco use.  Drug use.  Emotional well-being.  Home and relationship well-being.  Sexual activity.  Eating habits.  Work and work Statistician.  Method of birth control.  Menstrual cycle.  Pregnancy history.  Screening You may have the following tests or measurements:  Height, weight, and BMI.  Blood pressure.  Lipid and cholesterol levels. These may be checked every 5 years, or more frequently if you are over 81 years old.  Skin check.  Lung cancer screening. You may have this screening every year starting at age 78 if you have a 30-pack-year history of smoking and currently smoke or have quit within the past 15 years.  Fecal occult blood test (FOBT) of the stool. You may have this test every year starting at age 65.  Flexible sigmoidoscopy or colonoscopy. You may have a sigmoidoscopy every 5 years or a colonoscopy  every 10 years starting at age 30.  Hepatitis C blood test.  Hepatitis B blood test.  Sexually transmitted disease (STD) testing.  Diabetes screening. This is done by checking your blood sugar (glucose) after you have not eaten for a while (fasting). You may have this done every 1-3 years.  Mammogram. This may be done every 1-2 years. Talk to your health care provider about when you should start having regular mammograms. This may depend on whether you have a family history of breast cancer.  BRCA-related cancer screening. This may be done if you have a family history of breast, ovarian, tubal, or peritoneal cancers.  Pelvic exam and Pap test. This may be done every 3 years starting at age 80. Starting at age 36, this may be done every 5 years if you have a Pap test in combination with an HPV test.  Bone density scan. This is done to screen for osteoporosis. You may have this scan if you are at high risk for osteoporosis.  Discuss your test results, treatment options, and if necessary, the need for more tests with your health care provider. Vaccines Your health care provider may recommend certain vaccines, such as:  Influenza vaccine. This is recommended every year.  Tetanus, diphtheria, and acellular pertussis (Tdap, Td) vaccine. You may need a Td booster every 10 years.  Varicella vaccine. You may need this if you have not been vaccinated.  Zoster vaccine. You may need this after age 5.  Measles, mumps, and rubella (MMR) vaccine. You may need at least one dose of MMR if you were born in  1957 or later. You may also need a second dose.  Pneumococcal 13-valent conjugate (PCV13) vaccine. You may need this if you have certain conditions and were not previously vaccinated.  Pneumococcal polysaccharide (PPSV23) vaccine. You may need one or two doses if you smoke cigarettes or if you have certain conditions.  Meningococcal vaccine. You may need this if you have certain  conditions.  Hepatitis A vaccine. You may need this if you have certain conditions or if you travel or work in places where you may be exposed to hepatitis A.  Hepatitis B vaccine. You may need this if you have certain conditions or if you travel or work in places where you may be exposed to hepatitis B.  Haemophilus influenzae type b (Hib) vaccine. You may need this if you have certain conditions.  Talk to your health care provider about which screenings and vaccines you need and how often you need them. This information is not intended to replace advice given to you by your health care provider. Make sure you discuss any questions you have with your health care provider. Document Released: 02/28/2015 Document Revised: 10/22/2015 Document Reviewed: 12/03/2014 Elsevier Interactive Patient Education  2018 [place] Eating Plan DASH stands for "Dietary Approaches to Stop Hypertension." The DASH eating plan is a healthy eating plan that has been shown to reduce high blood pressure (hypertension). It may also reduce your risk for type 2 diabetes, heart disease, and stroke. The DASH eating plan may also help with weight loss. What are tips for following this plan? General guidelines  Avoid eating more than 2,300 mg (milligrams) of salt (sodium) a day. If you have hypertension, you may need to reduce your sodium intake to 1,500 mg a day.  Limit alcohol intake to no more than 1 drink a day for nonpregnant women and 2 drinks a day for men. One drink equals 12 oz of beer, 5 oz of wine, or 1 oz of hard liquor.  Work with your health care provider to maintain a healthy body weight or to lose weight. Ask what an ideal weight is for you.  Get at least 30 minutes of exercise that causes your heart to beat faster (aerobic exercise) most days of the week. Activities may include walking, swimming, or biking.  Work with your health care provider or diet and nutrition specialist (dietitian) to  adjust your eating plan to your individual calorie needs. Reading food labels  Check food labels for the amount of sodium per serving. Choose foods with less than 5 percent of the Daily Value of sodium. Generally, foods with less than 300 mg of sodium per serving fit into this eating plan.  To find whole grains, look for the word "whole" as the first word in the ingredient list. Shopping  Buy products labeled as "low-sodium" or "no salt added."  Buy fresh foods. Avoid canned foods and premade or frozen meals. Cooking  Avoid adding salt when cooking. Use salt-free seasonings or herbs instead of table salt or sea salt. Check with your health care provider or pharmacist before using salt substitutes.  Do not fry foods. Cook foods using healthy methods such as baking, boiling, grilling, and broiling instead.  Cook with heart-healthy oils, such as olive, canola, soybean, or sunflower oil. Meal planning   Eat a balanced diet that includes: ? 5 or more servings of fruits and vegetables each day. At each meal, try to fill half of your plate with fruits and vegetables. ? Up to  6-8 servings of whole grains each day. ? Less than 6 oz of lean meat, poultry, or fish each day. A 3-oz serving of meat is about the same size as a deck of cards. One egg equals 1 oz. ? 2 servings of low-fat dairy each day. ? A serving of nuts, seeds, or beans 5 times each week. ? Heart-healthy fats. Healthy fats called Omega-3 fatty acids are found in foods such as flaxseeds and coldwater fish, like sardines, salmon, and mackerel.  Limit how much you eat of the following: ? Canned or prepackaged foods. ? Food that is high in trans fat, such as fried foods. ? Food that is high in saturated fat, such as fatty meat. ? Sweets, desserts, sugary drinks, and other foods with added sugar. ? Full-fat dairy products.  Do not salt foods before eating.  Try to eat at least 2 vegetarian meals each week.  Eat more home-cooked  food and less restaurant, buffet, and fast food.  When eating at a restaurant, ask that your food be prepared with less salt or no salt, if possible. What foods are recommended? The items listed may not be a complete list. Talk with your dietitian about what dietary choices are best for you. Grains Whole-grain or whole-wheat bread. Whole-grain or whole-wheat pasta. Brown rice. Modena Morrow. Bulgur. Whole-grain and low-sodium cereals. Pita bread. Low-fat, low-sodium crackers. Whole-wheat flour tortillas. Vegetables Fresh or frozen vegetables (raw, steamed, roasted, or grilled). Low-sodium or reduced-sodium tomato and vegetable juice. Low-sodium or reduced-sodium tomato sauce and tomato paste. Low-sodium or reduced-sodium canned vegetables. Fruits All fresh, dried, or frozen fruit. Canned fruit in natural juice (without added sugar). Meat and other protein foods Skinless chicken or [place]. Ground chicken or [place]. Pork with fat trimmed off. Fish and seafood. Egg whites. Dried beans, peas, or lentils. Unsalted nuts, nut butters, and seeds. Unsalted canned beans. Lean cuts of beef with fat trimmed off. Low-sodium, lean deli meat. Dairy Low-fat (1%) or fat-free (skim) milk. Fat-free, low-fat, or reduced-fat cheeses. Nonfat, low-sodium ricotta or cottage cheese. Low-fat or nonfat yogurt. Low-fat, low-sodium cheese. Fats and oils Soft margarine without trans fats. Vegetable oil. Low-fat, reduced-fat, or light mayonnaise and salad dressings (reduced-sodium). Canola, safflower, olive, soybean, and sunflower oils. Avocado. Seasoning and other foods Herbs. Spices. Seasoning mixes without salt. Unsalted popcorn and pretzels. Fat-free sweets. What foods are not recommended? The items listed may not be a complete list. Talk with your dietitian about what dietary choices are best for you. Grains Baked goods made with fat, such as croissants, muffins, or some breads. Dry pasta or rice meal  packs. Vegetables Creamed or fried vegetables. Vegetables in a cheese sauce. Regular canned vegetables (not low-sodium or reduced-sodium). Regular canned tomato sauce and paste (not low-sodium or reduced-sodium). Regular tomato and vegetable juice (not low-sodium or reduced-sodium). Angie Fava. Olives. Fruits Canned fruit in a light or heavy syrup. Fried fruit. Fruit in cream or butter sauce. Meat and other protein foods Fatty cuts of meat. Ribs. Fried meat. Berniece Salines. Sausage. Bologna and other processed lunch meats. Salami. Fatback. Hotdogs. Bratwurst. Salted nuts and seeds. Canned beans with added salt. Canned or smoked fish. Whole eggs or egg yolks. Chicken or [place] with skin. Dairy Whole or 2% milk, cream, and half-and-half. Whole or full-fat cream cheese. Whole-fat or sweetened yogurt. Full-fat cheese. Nondairy creamers. Whipped toppings. Processed cheese and cheese spreads. Fats and oils Butter. Stick margarine. Lard. Shortening. Ghee. Bacon fat. Tropical oils, such as coconut, palm kernel, or palm oil. Seasoning and  other foods Salted popcorn and pretzels. Onion salt, garlic salt, seasoned salt, table salt, and sea salt. Worcestershire sauce. Tartar sauce. Barbecue sauce. Teriyaki sauce. Soy sauce, including reduced-sodium. Steak sauce. Canned and packaged gravies. Fish sauce. Oyster sauce. Cocktail sauce. Horseradish that you find on the shelf. Ketchup. Mustard. Meat flavorings and tenderizers. Bouillon cubes. Hot sauce and Tabasco sauce. Premade or packaged marinades. Premade or packaged taco seasonings. Relishes. Regular salad dressings. Where to find more information:  National Heart, Lung, and [place]: https://wilson-eaton.com/  American Heart Association: www.heart.org Summary  The DASH eating plan is a healthy eating plan that has been shown to reduce high blood pressure (hypertension). It may also reduce your risk for type 2 diabetes, heart disease, and stroke.  With the DASH eating  plan, you should limit salt (sodium) intake to 2,300 mg a day. If you have hypertension, you may need to reduce your sodium intake to 1,500 mg a day.  When on the DASH eating plan, aim to eat more fresh fruits and vegetables, whole grains, lean proteins, low-fat dairy, and heart-healthy fats.  Work with your health care provider or diet and nutrition specialist (dietitian) to adjust your eating plan to your individual calorie needs. This information is not intended to replace advice given to you by your health care provider. Make sure you discuss any questions you have with your health care provider. Document Released: 01/21/2011 Document Revised: 01/26/2016 Document Reviewed: 01/26/2016 Elsevier Interactive Patient Education  [employer].

## 2017-02-21 NOTE — Assessment & Plan Note (Signed)
Encouraged DASH diet, decrease po intake and increase exercise as tolerated. Needs 7-8 hours of sleep nightly. Avoid trans fats, eat small, frequent meals every 4-5 hours with lean proteins, complex carbs and healthy fats. Minimize simple carbs, bariatric referral 

## 2017-04-08 ENCOUNTER — Telehealth: Payer: Self-pay | Admitting: Family Medicine

## 2017-04-08 ENCOUNTER — Telehealth: Payer: Self-pay

## 2017-04-08 NOTE — Telephone Encounter (Signed)
PA initiated via Covermymeds; KEY: [id number]. Awaiting determination.

## 2017-04-08 NOTE — Telephone Encounter (Signed)
[employer] benefits received PA is required Covered 100% without and OV    Patient may owe approximately $0 OOP  [facility]

## 2017-04-08 NOTE — Telephone Encounter (Signed)
Initiating PA

## 2017-04-12 NOTE — Telephone Encounter (Signed)
[place] [employer], requesting call back [place], call back [phone] ref #[id number]

## 2017-04-14 ENCOUNTER — Telehealth: Payer: Self-pay | Admitting: Family Medicine

## 2017-04-14 NOTE — Telephone Encounter (Signed)
Bone density report faxed- waiting for determination.

## 2017-04-14 NOTE — Telephone Encounter (Signed)
Just now (2:51 PM)      Copied from CRM [phone]. Topic: Quick Communication - See Telephone Encounter >> Apr 14, 2017  2:49 PM Floria RavelingStovall, Shana A wrote: CRM for notification. See Telephone encounter for: [employer] called in and said that they are need pt last dexa scan with T score less then neg 2.5 or lower and place of service .  This is to approve her prolia injection    Fax number [phone]  Phone number [phone]    04/14/17.     Unsigned  Documentation

## 2017-04-14 NOTE — Telephone Encounter (Signed)
Copied from CRM [phone]. Topic: Quick Communication - See Telephone Encounter >> Apr 14, 2017  2:49 PM Floria RavelingStovall, Shana A wrote: CRM for notification. See Telephone encounter for: [employer] called in and said that they are need pt last dexa scan with T score less then neg 2.5 or lower and place of service .  This is to approve her prolia injection    Fax number [phone]  Phone number [phone]    04/14/17.

## 2017-04-14 NOTE — Telephone Encounter (Signed)
Duplicate note

## 2017-04-18 NOTE — Telephone Encounter (Signed)
PA approved.

## 2017-04-18 NOTE — Telephone Encounter (Signed)
PA approved. Effective 04/08/2017 through 04/08/2018.

## 2017-04-21 DIAGNOSIS — Z7984 Long term (current) use of oral hypoglycemic drugs: Secondary | ICD-10-CM | POA: Diagnosis not present

## 2017-04-21 DIAGNOSIS — E119 Type 2 diabetes mellitus without complications: Secondary | ICD-10-CM | POA: Diagnosis not present

## 2017-04-21 DIAGNOSIS — H524 Presbyopia: Secondary | ICD-10-CM | POA: Diagnosis not present

## 2017-04-21 DIAGNOSIS — H2513 Age-related nuclear cataract, bilateral: Secondary | ICD-10-CM | POA: Diagnosis not present

## 2017-05-04 NOTE — Telephone Encounter (Signed)
Letter mailed informing patient of benefits and to schedule

## 2017-06-16 ENCOUNTER — Other Ambulatory Visit: Payer: Self-pay | Admitting: Family Medicine

## 2017-06-16 DIAGNOSIS — Z1231 Encounter for screening mammogram for malignant neoplasm of breast: Secondary | ICD-10-CM

## 2017-07-01 ENCOUNTER — Ambulatory Visit
Admission: RE | Admit: 2017-07-01 | Discharge: 2017-07-01 | Disposition: A | Discharge status: Home / Self Care | Payer: BLUE CROSS/BLUE SHIELD | Source: Ambulatory Visit | Attending: Family Medicine | Admitting: Family Medicine

## 2017-07-01 DIAGNOSIS — Z1231 Encounter for screening mammogram for malignant neoplasm of breast: Secondary | ICD-10-CM | POA: Diagnosis not present

## 2017-07-05 ENCOUNTER — Telehealth: Payer: Self-pay | Admitting: *Deleted

## 2017-07-05 NOTE — Telephone Encounter (Signed)
Pt due for 2nd Shingrix vaccine. Pt has OV on 08/22/17 and dose has been reserved for pt.

## 2017-08-09 DIAGNOSIS — D225 Melanocytic nevi of trunk: Secondary | ICD-10-CM | POA: Diagnosis not present

## 2017-08-09 DIAGNOSIS — L720 Epidermal cyst: Secondary | ICD-10-CM | POA: Diagnosis not present

## 2017-08-09 DIAGNOSIS — D2262 Melanocytic nevi of left upper limb, including shoulder: Secondary | ICD-10-CM | POA: Diagnosis not present

## 2017-08-09 DIAGNOSIS — L821 Other seborrheic keratosis: Secondary | ICD-10-CM | POA: Diagnosis not present

## 2017-08-22 ENCOUNTER — Encounter: Payer: Self-pay | Admitting: Family Medicine

## 2017-08-22 ENCOUNTER — Ambulatory Visit: Payer: BLUE CROSS/BLUE SHIELD | Admitting: Family Medicine

## 2017-08-22 VITALS — BP 128/73 | HR 70 | Temp 98.2°F | Resp 18 | Ht 67.0 in | Wt 209.8 lb

## 2017-08-22 DIAGNOSIS — Z23 Encounter for immunization: Secondary | ICD-10-CM | POA: Diagnosis not present

## 2017-08-22 DIAGNOSIS — E118 Type 2 diabetes mellitus with unspecified complications: Secondary | ICD-10-CM

## 2017-08-22 DIAGNOSIS — E559 Vitamin D deficiency, unspecified: Secondary | ICD-10-CM | POA: Diagnosis not present

## 2017-08-22 DIAGNOSIS — E669 Obesity, unspecified: Secondary | ICD-10-CM

## 2017-08-22 DIAGNOSIS — E038 Other specified hypothyroidism: Secondary | ICD-10-CM

## 2017-08-22 DIAGNOSIS — I10 Essential (primary) hypertension: Secondary | ICD-10-CM

## 2017-08-22 DIAGNOSIS — M810 Age-related osteoporosis without current pathological fracture: Secondary | ICD-10-CM

## 2017-08-22 LAB — COMPREHENSIVE METABOLIC PANEL
ALBUMIN: 4.5 g/dL (ref 3.5–5.2)
ALK PHOS: 71 U/L (ref 39–117)
ALT: 17 U/L (ref 0–35)
AST: 16 U/L (ref 0–37)
BILIRUBIN TOTAL: 0.6 mg/dL (ref 0.2–1.2)
BUN: 15 mg/dL (ref 6–23)
CO2: 28 mEq/L (ref 19–32)
Calcium: 9.5 mg/dL (ref 8.4–10.5)
Chloride: 101 mEq/L (ref 96–112)
Creatinine, Ser: 0.71 mg/dL (ref 0.40–1.20)
GFR: 88.21 mL/min (ref >60.00)
GLUCOSE: 155 mg/dL — AB (ref 70–99)
POTASSIUM: 4 meq/L (ref 3.5–5.1)
SODIUM: 138 meq/L (ref 135–145)
TOTAL PROTEIN: 6.6 g/dL (ref 6.0–8.3)

## 2017-08-22 LAB — CBC
HEMATOCRIT: 42.2 % (ref 36.0–46.0)
HEMOGLOBIN: 14.3 g/dL (ref 12.0–15.0)
MCHC: 33.9 g/dL (ref 30.0–36.0)
MCV: 85.5 fl (ref 78.0–100.0)
Platelets: 240 10*3/uL (ref 150.0–400.0)
RBC: 4.93 Mil/uL (ref 3.87–5.11)
RDW: 12.7 % (ref 11.5–15.5)
WBC: 6.9 10*3/uL (ref 4.0–10.5)

## 2017-08-22 LAB — VITAMIN D 25 HYDROXY (VIT D DEFICIENCY, FRACTURES): VITD: 30.17 ng/mL (ref 30.00–100.00)

## 2017-08-22 LAB — MICROALBUMIN / CREATININE URINE RATIO
Creatinine,U: 33.7 mg/dL
Microalb Creat Ratio: 2.1 mg/g (ref 0.0–30.0)
Microalb, Ur: <0.7 mg/dL (ref 0.0–1.9)

## 2017-08-22 LAB — HEMOGLOBIN A1C: HEMOGLOBIN A1C: 7.6 % — AB (ref 4.6–6.5)

## 2017-08-22 LAB — TSH: TSH: 1.13 u[IU]/mL (ref 0.35–4.50)

## 2017-08-22 MED ORDER — LISINOPRIL 2.5 MG PO TABS: 2.5000 mg | ORAL_TABLET | Freq: Every day | ORAL | 1 refills | Status: DC

## 2017-08-22 MED ORDER — GLIPIZIDE ER 5 MG PO TB24: 5.0000 mg | ORAL_TABLET | Freq: Two times a day (BID) | ORAL | 1 refills | Status: DC

## 2017-08-22 MED ORDER — AMLODIPINE BESYLATE 5 MG PO TABS: 5.0000 mg | ORAL_TABLET | Freq: Every day | ORAL | 1 refills | Status: DC

## 2017-08-22 MED ORDER — LEVOTHYROXINE SODIUM 75 MCG PO TABS: 75.0000 ug | ORAL_TABLET | Freq: Every day | ORAL | 1 refills | Status: DC

## 2017-08-22 NOTE — Assessment & Plan Note (Signed)
Check level today 

## 2017-08-22 NOTE — Addendum Note (Signed)
Addended by: Crissie SicklesARTER, Ayssa Bentivegna A on: 08/22/2017 09:46 AM   Modules accepted: Orders

## 2017-08-22 NOTE — Assessment & Plan Note (Signed)
On Levothyroxine, continue to monitor 

## 2017-08-22 NOTE — Assessment & Plan Note (Signed)
hgba1c acceptable, minimize simple carbs. Increase exercise as tolerated. Continue current meds 

## 2017-08-22 NOTE — Assessment & Plan Note (Addendum)
Encouraged to get adequate exercise, calcium and vitamin d intake. Considering Prolia will check labs.

## 2017-08-22 NOTE — Assessment & Plan Note (Signed)
Encouraged DASH diet, decrease po intake and increase exercise as tolerated. Needs 7-8 hours of sleep nightly. Avoid trans fats, eat small, frequent meals every 4-5 hours with lean proteins, complex carbs and healthy fats. Minimize simple carbs 

## 2017-08-22 NOTE — Patient Instructions (Signed)

## 2017-08-22 NOTE — Progress Notes (Signed)
Subjective:  I acted as a Neurosurgeon for Dr. Abner Greenspan. [place], [place]  Patient ID: [name] [name], female    DOB: [birth year], 62 y.o.   MRN: [id number]  No chief complaint on file.   HPI  Patient is in today for 6 month follow up and generally feels well. No recent febrile illness or hospitalizations. Denies any polyuria or polydipsia. Has not been checking sugars but reports she has been walking at least a 1/2 hour most days and eating a heart healthy diet with less carbs. Denies CP/palp/SOB/HA/congestion/fevers/GI or GU c/o. Taking meds as prescribed  Patient Care Team: Bradd Canary, MD as PCP - General (Family Medicine) Dorena Cookey, MD (Inactive) as Consulting Physician (Gastroenterology) Arminda Resides, MD as Consulting Physician (Dermatology)   Past Medical History:  Diagnosis Date  . Back pain 06/13/2015  . Diabetes mellitus    started Metformin 12/2012  . History of shingles 2012   back  . Hyperlipidemia   . Hypertension   . Hypothyroidism 07/29/2010   S/p FNA showing non-neoplastic goiter 7/12 w/ Dr Pollyann Kennedy   . Obesity 08/06/2016  . Osteoporosis 08/06/2016  . Vitamin D deficiency 08/06/2016    Past Surgical History:  Procedure Laterality Date  . ABDOMINAL HYSTERECTOMY  1997  . APPENDECTOMY    . MASS EXCISION  03/02/2011   Procedure: EXCISION MASS;  Surgeon: Nicki Reaper, MD;  Location: [facility];  Service: Orthopedics;  Laterality: Right;  Excision Cyst Interphangeal Right Thumb  . OOPHORECTOMY    . SHOULDER ARTHROSCOPY W/ ROTATOR CUFF REPAIR  2007   rt  . TONSILLECTOMY    . TRIGGER FINGER RELEASE  11/01/2011   Procedure: RELEASE TRIGGER FINGER/A-1 PULLEY;  Surgeon: Nicki Reaper, MD;  Location: [facility];  Service: Orthopedics;  Laterality: Right;  right thumb, possible excision cyst    Family History  Problem Relation Age of Onset  . Hypertension Mother   . Diabetes Mother   . Breast cancer Mother 8  . Other Mother   . Hypertension  Father   . Cancer Father        throat  . Alcohol abuse Father   . Hypertension Brother   . Heart disease Brother        heart murmur  . Alcohol abuse Brother   . Cancer Maternal Grandmother   . Heart disease Maternal Grandfather   . Heart disease Paternal Grandmother   . Cancer Maternal Aunt   . Breast cancer Maternal Aunt     Social History   Socioeconomic History  . Marital status: Married    Spouse name: Not on file  . Number of children: Not on file  . Years of education: Not on file  . Highest education level: Not on file  Occupational History  . Occupation: Physiological scientist  Social Needs  . Financial resource strain: Not on file  . Food insecurity:    Worry: Not on file    Inability: Not on file  . Transportation needs:    Medical: Not on file    Non-medical: Not on file  Tobacco Use  . Smoking status: Never Smoker  . Smokeless tobacco: Never Used  Substance and Sexual Activity  . Alcohol use: No  . Drug use: No  . Sexual activity: Yes    Partners: Male    Comment: lives with husband, works at [employer], no major dietary   Lifestyle  . Physical activity:    Days per week: Not  on file    Minutes per session: Not on file  . Stress: Not on file  Relationships  . Social connections:    Talks on phone: Not on file    Gets together: Not on file    Attends religious service: Not on file    Active member of club or organization: Not on file    Attends meetings of clubs or organizations: Not on file    Relationship status: Not on file  . Intimate partner violence:    Fear of current or ex partner: Not on file    Emotionally abused: Not on file    Physically abused: Not on file    Forced sexual activity: Not on file  Other Topics Concern  . Not on file  Social History Narrative   Married; step daughter   Regular exercise: walk 2 days a week   Caffeine use: 2 cups of coffee daily    Outpatient Medications Prior to Visit  Medication Sig Dispense  Refill  . Ascorbic Acid (VITAMIN C) 1000 MG tablet Take 1,000 mg by mouth daily.      Marland Kitchen aspirin 81 MG EC tablet Take 81 mg by mouth daily.      . calcium carbonate (CALCIUM 500) 1250 MG tablet Take 1 tablet by mouth daily.      . Multiple Vitamin (MULTIVITAMIN) tablet Take 1 tablet by mouth daily.      . Omega-3 Fatty Acids (FISH OIL PO) Take by mouth daily.      . Vitamin D, Ergocalciferol, (DRISDOL) 50000 units CAPS capsule Take 1 capsule (50,000 Units total) by mouth every 7 (seven) days. 4 capsule 4  . amLODipine (NORVASC) 5 MG tablet Take 1 tablet (5 mg total) by mouth daily. 90 tablet 1  . glipiZIDE (GLUCOTROL XL) 5 MG 24 hr tablet Take 1 tablet (5 mg total) by mouth 2 (two) times daily. 180 tablet 1  . levothyroxine (SYNTHROID, LEVOTHROID) 75 MCG tablet Take 1 tablet (75 mcg total) by mouth daily before breakfast. 90 tablet 1  . lisinopril (PRINIVIL,ZESTRIL) 2.5 MG tablet Take 1 tablet (2.5 mg total) by mouth daily. 90 tablet 1  . denosumab (PROLIA) 60 MG/ML SOLN injection Inject 60 mg into the skin every 6 (six) months. Administer in upper arm, thigh, or abdomen     No facility-administered medications prior to visit.     Allergies  Allergen Reactions  . Codeine   . Tramadol     Review of Systems  Constitutional: Negative for fever and malaise/fatigue.  HENT: Negative for congestion.   Eyes: Negative for blurred vision.  Respiratory: Negative for shortness of breath.   Cardiovascular: Negative for chest pain, palpitations and leg swelling.  Gastrointestinal: Negative for abdominal pain, blood in stool and nausea.  Genitourinary: Negative for dysuria and frequency.  Musculoskeletal: Negative for falls.  Skin: Negative for rash.  Neurological: Negative for dizziness, loss of consciousness and headaches.  Endo/Heme/Allergies: Negative for environmental allergies.  Psychiatric/Behavioral: Negative for depression. The patient is not nervous/anxious.        Objective:      Physical Exam  Constitutional: She is oriented to person, place, and time. No distress.  HENT:  Head: Normocephalic and atraumatic.  Right Ear: External ear normal.  Left Ear: External ear normal.  Nose: Nose normal.  Mouth/Throat: Oropharynx is clear and moist. No oropharyngeal exudate.  Eyes: Pupils are equal, round, and reactive to light. Conjunctivae are normal. Right eye exhibits no discharge. Left eye exhibits no  discharge. No scleral icterus.  Neck: Normal range of motion. Neck supple. No thyromegaly present.  Cardiovascular: Normal rate, regular rhythm, normal heart sounds and intact distal pulses.  No murmur heard. Pulmonary/Chest: Effort normal and breath sounds normal. No respiratory distress. She has no wheezes. She has no rales.  Abdominal: Soft. Bowel sounds are normal. She exhibits no distension and no mass. There is no tenderness.  Musculoskeletal: Normal range of motion. She exhibits no edema or tenderness.  Lymphadenopathy:    She has no cervical adenopathy.  Neurological: She is alert and oriented to person, place, and time. She has normal reflexes. She displays normal reflexes. No cranial nerve deficit. Coordination normal.  Skin: Skin is warm and dry. No rash noted. She is not diaphoretic.    BP 128/73 (BP Location: Left Arm, Patient Position: Sitting, Cuff Size: Normal)   Pulse 70   Temp 98.2 F (36.8 C) (Oral)   Resp 18   Ht 5\' 7"  (1.702 m)   Wt 209 lb 12.8 oz (95.2 kg)   SpO2 100%   BMI 32.86 kg/m  Wt Readings from Last 3 Encounters:  08/22/17 209 lb 12.8 oz (95.2 kg)  02/21/17 208 lb (94.3 kg)  08/06/16 213 lb 6.4 oz (96.8 kg)   BP Readings from Last 3 Encounters:  08/22/17 128/73  02/21/17 136/82  08/06/16 120/70     Immunization History  Administered Date(s) Administered  . Influenza,inj,Quad PF,6+ Mos 12/05/2012, 12/13/2013, 03/11/2015  . Influenza-Unspecified 12/20/2016  . Pneumococcal Conjugate-13 06/13/2015  . Pneumococcal  Polysaccharide-23 02/15/2009  . Td 02/16/2007  . Tdap 02/21/2017  . Zoster 06/13/2015  . Zoster Recombinat (Shingrix) 02/21/2017    Health Maintenance  Topic Date Due  . HIV Screening  [birth year]01/1971  . PNEUMOCOCCAL POLYSACCHARIDE VACCINE (2) 02/15/2014  . FOOT EXAM  09/10/2015  . OPHTHALMOLOGY EXAM  04/19/2017  . HEMOGLOBIN A1C  08/21/2017  . INFLUENZA VACCINE  09/15/2017  . COLONOSCOPY  08/28/2018  . MAMMOGRAM  07/02/2019  . TETANUS/TDAP  02/22/2027  . Hepatitis C Screening  Completed    Lab Results  Component Value Date   WBC 6.6 02/21/2017   HGB 14.4 02/21/2017   HCT 43.7 02/21/2017   PLT 251.0 02/21/2017   GLUCOSE 153 (H) 02/21/2017   CHOL 172 02/21/2017   TRIG 110.0 02/21/2017   HDL 52.80 02/21/2017   LDLCALC 97 02/21/2017   ALT 19 02/21/2017   AST 17 02/21/2017   NA 138 02/21/2017   K 3.9 02/21/2017   CL 101 02/21/2017   CREATININE 0.68 02/21/2017   BUN 12 02/21/2017   CO2 28 02/21/2017   TSH 1.18 02/21/2017   HGBA1C 7.6 (H) 02/21/2017   MICROALBUR 0.9 02/27/2016    Lab Results  Component Value Date   TSH 1.18 02/21/2017   Lab Results  Component Value Date   WBC 6.6 02/21/2017   HGB 14.4 02/21/2017   HCT 43.7 02/21/2017   MCV 87.1 02/21/2017   PLT 251.0 02/21/2017   Lab Results  Component Value Date   NA 138 02/21/2017   K 3.9 02/21/2017   CO2 28 02/21/2017   GLUCOSE 153 (H) 02/21/2017   BUN 12 02/21/2017   CREATININE 0.68 02/21/2017   BILITOT 0.6 02/21/2017   ALKPHOS 74 02/21/2017   AST 17 02/21/2017   ALT 19 02/21/2017   PROT 6.7 02/21/2017   ALBUMIN 4.5 02/21/2017   CALCIUM 9.3 02/21/2017   GFR 92.87 02/21/2017   Lab Results  Component Value Date   CHOL 172  02/21/2017   Lab Results  Component Value Date   HDL 52.80 02/21/2017   Lab Results  Component Value Date   LDLCALC 97 02/21/2017   Lab Results  Component Value Date   TRIG 110.0 02/21/2017   Lab Results  Component Value Date   CHOLHDL 3 02/21/2017   Lab Results    Component Value Date   HGBA1C 7.6 (H) 02/21/2017         Assessment & Plan:   Problem List Items Addressed This Visit    Essential hypertension    Well controlled, no changes to meds. Encouraged heart healthy diet such as the DASH diet and exercise as tolerated.       Relevant Medications   lisinopril (PRINIVIL,ZESTRIL) 2.5 MG tablet   amLODipine (NORVASC) 5 MG tablet   Other Relevant Orders   CBC   Comprehensive metabolic panel   TSH   Microalbumin / creatinine urine ratio   Hypothyroidism    On Levothyroxine, continue to monitor      Relevant Medications   levothyroxine (SYNTHROID, LEVOTHROID) 75 MCG tablet   DM type 2 (diabetes mellitus, type 2) (HCC)    hgba1c acceptable, minimize simple carbs. Increase exercise as tolerated. Continue current meds      Relevant Medications   lisinopril (PRINIVIL,ZESTRIL) 2.5 MG tablet   glipiZIDE (GLUCOTROL XL) 5 MG 24 hr tablet   Other Relevant Orders   Hemoglobin A1c   Osteoporosis, post-menopausal    Encouraged to get adequate exercise, calcium and vitamin d intake. Considering Prolia will check labs.       Vitamin D deficiency    Check level today      Relevant Orders   VITAMIN D 25 Hydroxy (Vit-D Deficiency, Fractures)   Obesity    Encouraged DASH diet, decrease po intake and increase exercise as tolerated. Needs 7-8 hours of sleep nightly. Avoid trans fats, eat small, frequent meals every 4-5 hours with lean proteins, complex carbs and healthy fats. Minimize simple carbs      Relevant Medications   glipiZIDE (GLUCOTROL XL) 5 MG 24 hr tablet      I am having [name] [name] maintain her aspirin, calcium carbonate, Omega-3 Fatty Acids (FISH OIL PO), multivitamin, vitamin C, Vitamin D (Ergocalciferol), denosumab, lisinopril, amLODipine, glipiZIDE, and levothyroxine.  Meds ordered this encounter  Medications  . lisinopril (PRINIVIL,ZESTRIL) 2.5 MG tablet    Sig: Take 1 tablet (2.5 mg total) by mouth daily.     Dispense:  90 tablet    Refill:  1  . amLODipine (NORVASC) 5 MG tablet    Sig: Take 1 tablet (5 mg total) by mouth daily.    Dispense:  90 tablet    Refill:  1  . glipiZIDE (GLUCOTROL XL) 5 MG 24 hr tablet    Sig: Take 1 tablet (5 mg total) by mouth 2 (two) times daily.    Dispense:  180 tablet    Refill:  1  . levothyroxine (SYNTHROID, LEVOTHROID) 75 MCG tablet    Sig: Take 1 tablet (75 mcg total) by mouth daily before breakfast.    Dispense:  90 tablet    Refill:  1    CMA served as scribe during this visit. History, Physical and Plan performed by medical provider. Documentation and orders reviewed and attested to.  Danise EdgeStacey Blyth, MD

## 2017-08-22 NOTE — Assessment & Plan Note (Signed)
Well controlled, no changes to meds. Encouraged heart healthy diet such as the DASH diet and exercise as tolerated.  °

## 2017-12-15 ENCOUNTER — Ambulatory Visit: Payer: BLUE CROSS/BLUE SHIELD | Admitting: Family Medicine

## 2017-12-15 ENCOUNTER — Encounter: Payer: Self-pay | Admitting: Family Medicine

## 2017-12-15 ENCOUNTER — Ambulatory Visit (HOSPITAL_BASED_OUTPATIENT_CLINIC_OR_DEPARTMENT_OTHER)
Admission: RE | Admit: 2017-12-15 | Discharge: 2017-12-15 | Disposition: A | Discharge status: Home / Self Care | Payer: BLUE CROSS/BLUE SHIELD | Source: Ambulatory Visit | Attending: Family Medicine | Admitting: Family Medicine

## 2017-12-15 VITALS — BP 138/72 | HR 86 | Temp 98.2°F | Resp 18 | Wt 207.6 lb

## 2017-12-15 DIAGNOSIS — E119 Type 2 diabetes mellitus without complications: Secondary | ICD-10-CM

## 2017-12-15 DIAGNOSIS — M25562 Pain in left knee: Secondary | ICD-10-CM | POA: Insufficient documentation

## 2017-12-15 DIAGNOSIS — M25522 Pain in left elbow: Secondary | ICD-10-CM

## 2017-12-15 DIAGNOSIS — M810 Age-related osteoporosis without current pathological fracture: Secondary | ICD-10-CM

## 2017-12-15 DIAGNOSIS — R21 Rash and other nonspecific skin eruption: Secondary | ICD-10-CM

## 2017-12-15 DIAGNOSIS — I10 Essential (primary) hypertension: Secondary | ICD-10-CM | POA: Diagnosis not present

## 2017-12-15 DIAGNOSIS — E559 Vitamin D deficiency, unspecified: Secondary | ICD-10-CM | POA: Diagnosis not present

## 2017-12-15 DIAGNOSIS — Z23 Encounter for immunization: Secondary | ICD-10-CM | POA: Diagnosis not present

## 2017-12-15 DIAGNOSIS — E785 Hyperlipidemia, unspecified: Secondary | ICD-10-CM

## 2017-12-15 DIAGNOSIS — E038 Other specified hypothyroidism: Secondary | ICD-10-CM

## 2017-12-15 DIAGNOSIS — M1712 Unilateral primary osteoarthritis, left knee: Secondary | ICD-10-CM | POA: Diagnosis not present

## 2017-12-15 LAB — COMPREHENSIVE METABOLIC PANEL
ALT: 20 U/L (ref 0–35)
AST: 17 U/L (ref 0–37)
Albumin: 5.2 g/dL (ref 3.5–5.2)
Alkaline Phosphatase: 86 U/L (ref 39–117)
BILIRUBIN TOTAL: 0.5 mg/dL (ref 0.2–1.2)
BUN: 13 mg/dL (ref 6–23)
CHLORIDE: 100 meq/L (ref 96–112)
CO2: 28 meq/L (ref 19–32)
Calcium: 10.2 mg/dL (ref 8.4–10.5)
Creatinine, Ser: 0.79 mg/dL (ref 0.40–1.20)
GFR: 77.91 mL/min (ref >60.00)
GLUCOSE: 107 mg/dL — AB (ref 70–99)
POTASSIUM: 3.9 meq/L (ref 3.5–5.1)
Sodium: 139 mEq/L (ref 135–145)
Total Protein: 7.4 g/dL (ref 6.0–8.3)

## 2017-12-15 LAB — CBC
HCT: 45.5 % (ref 36.0–46.0)
Hemoglobin: 15.3 g/dL — ABNORMAL HIGH (ref 12.0–15.0)
MCHC: 33.7 g/dL (ref 30.0–36.0)
MCV: 85.4 fl (ref 78.0–100.0)
PLATELETS: 289 10*3/uL (ref 150.0–400.0)
RBC: 5.32 Mil/uL — AB (ref 3.87–5.11)
RDW: 12.7 % (ref 11.5–15.5)
WBC: 8.5 10*3/uL (ref 4.0–10.5)

## 2017-12-15 LAB — LIPID PANEL
CHOL/HDL RATIO: 4
Cholesterol: 219 mg/dL — ABNORMAL HIGH (ref 0–200)
HDL: 59.7 mg/dL (ref >39.00)
LDL CALC: 126 mg/dL — AB (ref 0–99)
NONHDL: 159.23
TRIGLYCERIDES: 168 mg/dL — AB (ref 0.0–149.0)
VLDL: 33.6 mg/dL (ref 0.0–40.0)

## 2017-12-15 LAB — VITAMIN D 25 HYDROXY (VIT D DEFICIENCY, FRACTURES): VITD: 38.95 ng/mL (ref 30.00–100.00)

## 2017-12-15 LAB — SEDIMENTATION RATE: Sed Rate: 1 mm/hr (ref 0–30)

## 2017-12-15 LAB — TSH: TSH: 2.15 u[IU]/mL (ref 0.35–4.50)

## 2017-12-15 MED ORDER — TRIAMCINOLONE ACETONIDE 0.1 % EX CREA: 1.0000 "application " | TOPICAL_CREAM | Freq: Two times a day (BID) | CUTANEOUS | 2 refills | Status: DC

## 2017-12-15 MED ORDER — MELOXICAM 15 MG PO TABS: 15.0000 mg | ORAL_TABLET | Freq: Every day | ORAL | 2 refills | Status: DC

## 2017-12-15 NOTE — Progress Notes (Signed)
Subjective:    Patient ID: [name] [name], female    DOB: 1955/02/28, 62 y.o.   MRN: [id number]  No chief complaint on file.   HPI Patient is in today for follow up. Notes persistent trouble with left elbow and left knee pain over the last 1 to 2 months.  She denies injury.  She notes the pain is just below and medial to her knee.  There is some swelling but no redness or warmth.  No injury in the past.  She does note a small rash over her flexor surface of her left elbow as well as one on the left side of her neck as well.  No recent febrile illness or acute concerns otherwise noted. Denies CP/palp/SOB/HA/congestion/fevers/GI or GU c/o. Taking meds as prescribed  Past Medical History:  Diagnosis Date  . Back pain 06/13/2015  . Diabetes mellitus    started Metformin 12/2012  . History of shingles 2012   back  . Hyperlipidemia   . Hypertension   . Hypothyroidism 07/29/2010   S/p FNA showing non-neoplastic goiter 7/12 w/ Dr Pollyann Kennedy   . Obesity 08/06/2016  . Osteoporosis 08/06/2016  . Vitamin D deficiency 08/06/2016    Past Surgical History:  Procedure Laterality Date  . ABDOMINAL HYSTERECTOMY  1997  . APPENDECTOMY    . MASS EXCISION  03/02/2011   Procedure: EXCISION MASS;  Surgeon: Nicki Reaper, MD;  Location: [facility];  Service: Orthopedics;  Laterality: Right;  Excision Cyst Interphangeal Right Thumb  . OOPHORECTOMY    . SHOULDER ARTHROSCOPY W/ ROTATOR CUFF REPAIR  2007   rt  . TONSILLECTOMY    . TRIGGER FINGER RELEASE  11/01/2011   Procedure: RELEASE TRIGGER FINGER/A-1 PULLEY;  Surgeon: Nicki Reaper, MD;  Location:  SURGERY CENTER;  Service: Orthopedics;  Laterality: Right;  right thumb, possible excision cyst    Family History  Problem Relation Age of Onset  . Hypertension Mother   . Diabetes Mother   . Breast cancer Mother 15  . Other Mother   . Hypertension Father   . Cancer Father        throat  . Alcohol abuse Father   . Hypertension  Brother   . Heart disease Brother        heart murmur  . Alcohol abuse Brother   . Cancer Maternal Grandmother   . Heart disease Maternal Grandfather   . Heart disease Paternal Grandmother   . Cancer Maternal Aunt   . Breast cancer Maternal Aunt     Social History   Socioeconomic History  . Marital status: Married    Spouse name: Not on file  . Number of children: Not on file  . Years of education: Not on file  . Highest education level: Not on file  Occupational History  . Occupation: Physiological scientist  Social Needs  . Financial resource strain: Not on file  . Food insecurity:    Worry: Not on file    Inability: Not on file  . Transportation needs:    Medical: Not on file    Non-medical: Not on file  Tobacco Use  . Smoking status: Never Smoker  . Smokeless tobacco: Never Used  Substance and Sexual Activity  . Alcohol use: No  . Drug use: No  . Sexual activity: Yes    Partners: Male    Comment: lives with husband, works at [employer], no major dietary   Lifestyle  . Physical activity:  Days per week: Not on file    Minutes per session: Not on file  . Stress: Not on file  Relationships  . Social connections:    Talks on phone: Not on file    Gets together: Not on file    Attends religious service: Not on file    Active member of club or organization: Not on file    Attends meetings of clubs or organizations: Not on file    Relationship status: Not on file  . Intimate partner violence:    Fear of current or ex partner: Not on file    Emotionally abused: Not on file    Physically abused: Not on file    Forced sexual activity: Not on file  Other Topics Concern  . Not on file  Social History Narrative   Married; step daughter   Regular exercise: walk 2 days a week   Caffeine use: 2 cups of coffee daily    Outpatient Medications Prior to Visit  Medication Sig Dispense Refill  . amLODipine (NORVASC) 5 MG tablet Take 1 tablet (5 mg total) by mouth  daily. 90 tablet 1  . Ascorbic Acid (VITAMIN C) 1000 MG tablet Take 1,000 mg by mouth daily.      Marland Kitchen aspirin 81 MG EC tablet Take 81 mg by mouth daily.      . calcium carbonate (CALCIUM 500) 1250 MG tablet Take 1 tablet by mouth daily.      Marland Kitchen glipiZIDE (GLUCOTROL XL) 5 MG 24 hr tablet Take 1 tablet (5 mg total) by mouth 2 (two) times daily. 180 tablet 1  . levothyroxine (SYNTHROID, LEVOTHROID) 75 MCG tablet Take 1 tablet (75 mcg total) by mouth daily before breakfast. 90 tablet 1  . lisinopril (PRINIVIL,ZESTRIL) 2.5 MG tablet Take 1 tablet (2.5 mg total) by mouth daily. 90 tablet 1  . Multiple Vitamin (MULTIVITAMIN) tablet Take 1 tablet by mouth daily.      . Omega-3 Fatty Acids (FISH OIL PO) Take by mouth daily.      Marland Kitchen denosumab (PROLIA) 60 MG/ML SOLN injection Inject 60 mg into the skin every 6 (six) months. Administer in upper arm, thigh, or abdomen    . Vitamin D, Ergocalciferol, (DRISDOL) 50000 units CAPS capsule Take 1 capsule (50,000 Units total) by mouth every 7 (seven) days. 4 capsule 4   No facility-administered medications prior to visit.     Allergies  Allergen Reactions  . Codeine   . Tramadol     Review of Systems  Constitutional: Negative for fever and malaise/fatigue.  HENT: Negative for congestion.   Eyes: Negative for blurred vision.  Respiratory: Negative for shortness of breath.   Cardiovascular: Negative for chest pain, palpitations and leg swelling.  Gastrointestinal: Negative for abdominal pain, blood in stool and nausea.  Genitourinary: Negative for dysuria and frequency.  Musculoskeletal: Positive for joint pain. Negative for falls.  Skin: Positive for rash.  Neurological: Negative for dizziness, loss of consciousness and headaches.  Endo/Heme/Allergies: Negative for environmental allergies.  Psychiatric/Behavioral: Negative for depression. The patient is not nervous/anxious.        Objective:    Physical Exam  Constitutional: She is oriented to person,  place, and time. She appears well-developed and well-nourished. No distress.  HENT:  Head: Normocephalic and atraumatic.  Eyes: Conjunctivae are normal.  Neck: Neck supple. No thyromegaly present.  Cardiovascular: Normal rate, regular rhythm and normal heart sounds.  No murmur heard. Pulmonary/Chest: Effort normal and breath sounds normal. No respiratory distress.  Abdominal: Soft. Bowel sounds are normal. She exhibits no distension and no mass. There is no tenderness.  Musculoskeletal: She exhibits no edema.  Lymphadenopathy:    She has no cervical adenopathy.  Neurological: She is alert and oriented to person, place, and time.  Skin: Skin is warm and dry. Rash noted.  Maculopapular lesion left flexor surface of elbow and left neck  Psychiatric: She has a normal mood and affect. Her behavior is normal.    BP 138/72 (BP Location: Left Arm, Patient Position: Sitting, Cuff Size: Normal)   Pulse 86   Temp 98.2 F (36.8 C) (Oral)   Resp 18   Wt 207 lb 9.6 oz (94.2 kg)   SpO2 98%   BMI 32.51 kg/m  Wt Readings from Last 3 Encounters:  12/15/17 207 lb 9.6 oz (94.2 kg)  08/22/17 209 lb 12.8 oz (95.2 kg)  02/21/17 208 lb (94.3 kg)     Lab Results  Component Value Date   WBC 8.5 12/15/2017   HGB 15.3 (H) 12/15/2017   HCT 45.5 12/15/2017   PLT 289.0 12/15/2017   GLUCOSE 107 (H) 12/15/2017   CHOL 219 (H) 12/15/2017   TRIG 168.0 (H) 12/15/2017   HDL 59.70 12/15/2017   LDLCALC 126 (H) 12/15/2017   ALT 20 12/15/2017   AST 17 12/15/2017   NA 139 12/15/2017   K 3.9 12/15/2017   CL 100 12/15/2017   CREATININE 0.79 12/15/2017   BUN 13 12/15/2017   CO2 28 12/15/2017   TSH 2.15 12/15/2017   HGBA1C 7.6 (H) 08/22/2017   MICROALBUR <0.7 08/22/2017    Lab Results  Component Value Date   TSH 2.15 12/15/2017   Lab Results  Component Value Date   WBC 8.5 12/15/2017   HGB 15.3 (H) 12/15/2017   HCT 45.5 12/15/2017   MCV 85.4 12/15/2017   PLT 289.0 12/15/2017   Lab Results    Component Value Date   NA 139 12/15/2017   K 3.9 12/15/2017   CO2 28 12/15/2017   GLUCOSE 107 (H) 12/15/2017   BUN 13 12/15/2017   CREATININE 0.79 12/15/2017   BILITOT 0.5 12/15/2017   ALKPHOS 86 12/15/2017   AST 17 12/15/2017   ALT 20 12/15/2017   PROT 7.4 12/15/2017   ALBUMIN 5.2 12/15/2017   CALCIUM 10.2 12/15/2017   GFR 77.91 12/15/2017   Lab Results  Component Value Date   CHOL 219 (H) 12/15/2017   Lab Results  Component Value Date   HDL 59.70 12/15/2017   Lab Results  Component Value Date   LDLCALC 126 (H) 12/15/2017   Lab Results  Component Value Date   TRIG 168.0 (H) 12/15/2017   Lab Results  Component Value Date   CHOLHDL 4 12/15/2017   Lab Results  Component Value Date   HGBA1C 7.6 (H) 08/22/2017       Assessment & Plan:   Problem List Items Addressed This Visit    Essential hypertension    Well controlled, no changes to meds. Encouraged heart healthy diet such as the DASH diet and exercise as tolerated.       Relevant Orders   CBC (Completed)   Comprehensive metabolic panel (Completed)   TSH (Completed)   Hypothyroidism    On Levothyroxine, continue to monitor      DM type 2 (diabetes mellitus, type 2) (HCC) - Primary   Hyperlipidemia with target LDL less than 100   Relevant Orders   Lipid panel (Completed)   Osteoporosis, post-menopausal  Encouraged to get adequate exercise, calcium and vitamin d intake      Vitamin D deficiency    Supplement and monitor      Relevant Orders   VITAMIN D 25 Hydroxy (Vit-D Deficiency, Fractures) (Completed)   Left elbow pain    meloxicam 15 mg daily, ice and lidocaine. Proceed with xrays      Relevant Orders   DG Elbow 2 Views Left   Left knee pain    Ice, lidocaine, Meloxcam. Check xray and if persists will need referral      Relevant Orders   Sedimentation rate (Completed)   DG Knee Complete 4 Views Left      I have discontinued Elease Hashimoto C. Pierre's Vitamin D (Ergocalciferol) and  denosumab. I am also having her start on triamcinolone cream and meloxicam. Additionally, I am having her maintain her aspirin, calcium carbonate, Omega-3 Fatty Acids (FISH OIL PO), multivitamin, vitamin C, lisinopril, amLODipine, glipiZIDE, and levothyroxine.  Meds ordered this encounter  Medications  . triamcinolone cream (KENALOG) 0.1 %    Sig: Apply 1 application topically 2 (two) times daily.    Dispense:  80 g    Refill:  2  . meloxicam (MOBIC) 15 MG tablet    Sig: Take 1 tablet (15 mg total) by mouth daily.    Dispense:  30 tablet    Refill:  2     Danise Edge, MD

## 2017-12-15 NOTE — Assessment & Plan Note (Signed)
Well controlled, no changes to meds. Encouraged heart healthy diet such as the DASH diet and exercise as tolerated.  °

## 2017-12-15 NOTE — Assessment & Plan Note (Signed)
Ice, lidocaine, Meloxcam. Check xray and if persists will need referral

## 2017-12-15 NOTE — Assessment & Plan Note (Signed)
Encouraged to get adequate exercise, calcium and vitamin d intake 

## 2017-12-15 NOTE — Assessment & Plan Note (Signed)
Supplement and monitor 

## 2017-12-15 NOTE — Patient Instructions (Signed)
Lidocaine gel as needed for pain  Knee Pain, Adult Knee pain in adults is common. It can be caused by many things, including:  Arthritis.  A fluid-filled sac (cyst) or growth in your knee.  An infection in your knee.  An injury that will not heal.  Damage, swelling, or irritation of the tissues that support your knee.  Knee pain is usually not a sign of a serious problem. The pain may go away on its own with time and rest. If it does not, a health care provider may order tests to find the cause of the pain. These may include:  Imaging tests, such as an X-ray, MRI, or ultrasound.  Joint aspiration. In this test, fluid is removed from the knee.  Arthroscopy. In this test, a lighted tube is inserted into knee and an image is projected onto a TV screen.  A biopsy. In this test, a sample of tissue is removed from the body and studied under a microscope.  Follow these instructions at home: Pay attention to any changes in your symptoms. Take these actions to relieve your pain. Activity  Rest your knee.  Do not do things that cause pain or make pain worse.  Avoid high-impact activities or exercises, such as running, jumping rope, or doing jumping jacks. General instructions  Take over-the-counter and prescription medicines only as told by your health care provider.  Raise (elevate) your knee above the level of your heart when you are sitting or lying down.  Sleep with a pillow under your knee.  If directed, apply ice to the knee: ? Put ice in a plastic bag. ? Place a towel between your skin and the bag. ? Leave the ice on for 20 minutes, 2-3 times a day.  Ask your health care provider if you should wear an elastic knee support.  Lose weight if you are overweight. Extra weight can put pressure on your knee.  Do not use any products that contain nicotine or tobacco, such as cigarettes and e-cigarettes. Smoking may slow the healing of any bone and joint problems that you may  have. If you need help quitting, ask your health care provider. Contact a health care provider if:  Your knee pain continues, changes, or gets worse.  You have a fever along with knee pain.  Your knee buckles or locks up.  Your knee swells, and the swelling becomes worse. Get help right away if:  Your knee feels warm to the touch.  You cannot move your knee.  You have severe pain in your knee.  You have chest pain.  You have trouble breathing. Summary  Knee pain in adults is common. It can be caused by many things, including, arthritis, infection, cysts, or injury.  Knee pain is usually not a sign of a serious problem, but if it does not go away, a health care provider may perform tests to know the cause of the pain.  Pay attention to any changes in your symptoms. Relieve your pain with rest, medicines, light activity, and use of ice.  Get help if your pain continues or becomes very severe, or if your knee buckles or locks up, or if you have chest pain or trouble breathing. This information is not intended to replace advice given to you by your health care provider. Make sure you discuss any questions you have with your health care provider. Document Released: 11/29/2006 Document Revised: 01/23/2016 Document Reviewed: 01/23/2016 Elsevier Interactive Patient Education  [employer].

## 2017-12-15 NOTE — Assessment & Plan Note (Signed)
Left elbow and left neck, triamcinolone ceam bid prn. If persists will need referral to dermatology

## 2017-12-15 NOTE — Assessment & Plan Note (Signed)
On Levothyroxine, continue to monitor 

## 2017-12-15 NOTE — Assessment & Plan Note (Signed)
meloxicam 15 mg daily, ice and lidocaine. Proceed with xrays

## 2017-12-16 ENCOUNTER — Other Ambulatory Visit (INDEPENDENT_AMBULATORY_CARE_PROVIDER_SITE_OTHER): Payer: BLUE CROSS/BLUE SHIELD

## 2017-12-16 DIAGNOSIS — E119 Type 2 diabetes mellitus without complications: Secondary | ICD-10-CM | POA: Diagnosis not present

## 2017-12-16 LAB — HEMOGLOBIN A1C: Hgb A1c MFr Bld: 7.7 % — ABNORMAL HIGH (ref 4.6–6.5)

## 2018-01-02 ENCOUNTER — Telehealth: Payer: Self-pay

## 2018-01-02 NOTE — Telephone Encounter (Signed)
Author noted pt. Had PA approved for prolia until 03/2018 per note on 04/08/17, but "historical provider" discontinued the medication on 12/15/17. Last labs drawn 12/15/17. Routed to Dr. Abner GreenspanBlyth to clarify need for prolia at this time.

## 2018-01-03 NOTE — Telephone Encounter (Signed)
I am happy to approve her Prolia if she is willing to continue please confirm with patient she wants to proceed and then proceed with PA

## 2018-01-04 NOTE — Telephone Encounter (Signed)
Author phoned pt. to assess prolia interest. Pt. stated she was interested in starting. Will confirm with prolia reps next week to confirm PA approved from February is still active, and confirm OOP costs, and will reach out to pt. to schedule NV appointment. Pt. Appreciative.

## 2018-02-01 NOTE — Telephone Encounter (Signed)
PA approved until 03/2018. Benefits re-run in portal. Awaiting summary of benefits verification via fax.

## 2018-02-07 NOTE — Telephone Encounter (Signed)
Summary of benefits received. PA required, on file, good until 03/2018. Pt. may owe approximately $0 OOP. Author phoned pt. To set up NV appointment. No answer; author left detailed VM asking for return call to schedule. OK for PEC to schedule prior to new year, medication labelled in fridge for pt.

## 2018-02-13 ENCOUNTER — Ambulatory Visit (INDEPENDENT_AMBULATORY_CARE_PROVIDER_SITE_OTHER): Payer: BLUE CROSS/BLUE SHIELD

## 2018-02-13 DIAGNOSIS — M810 Age-related osteoporosis without current pathological fracture: Secondary | ICD-10-CM

## 2018-02-13 MED ORDER — DENOSUMAB 60 MG/ML ~~LOC~~ SOSY
60.0000 mg | PREFILLED_SYRINGE | Freq: Once | SUBCUTANEOUS | Status: AC
  Administered 2018-02-13: 60 mg via SUBCUTANEOUS

## 2018-02-13 NOTE — Progress Notes (Signed)
Nursing note reviewed. Agree with documention and plan.  

## 2018-02-24 ENCOUNTER — Ambulatory Visit (INDEPENDENT_AMBULATORY_CARE_PROVIDER_SITE_OTHER): Payer: BLUE CROSS/BLUE SHIELD | Admitting: Family Medicine

## 2018-02-24 ENCOUNTER — Other Ambulatory Visit (HOSPITAL_COMMUNITY)
Admission: RE | Admit: 2018-02-24 | Discharge: 2018-02-24 | Disposition: A | Discharge status: Home / Self Care | Payer: BLUE CROSS/BLUE SHIELD | Source: Ambulatory Visit | Attending: Family Medicine | Admitting: Family Medicine

## 2018-02-24 ENCOUNTER — Encounter: Payer: Self-pay | Admitting: Family Medicine

## 2018-02-24 DIAGNOSIS — I10 Essential (primary) hypertension: Secondary | ICD-10-CM

## 2018-02-24 DIAGNOSIS — Z124 Encounter for screening for malignant neoplasm of cervix: Secondary | ICD-10-CM | POA: Diagnosis not present

## 2018-02-24 DIAGNOSIS — Z0000 Encounter for general adult medical examination without abnormal findings: Secondary | ICD-10-CM

## 2018-02-24 DIAGNOSIS — E038 Other specified hypothyroidism: Secondary | ICD-10-CM | POA: Diagnosis not present

## 2018-02-24 DIAGNOSIS — E559 Vitamin D deficiency, unspecified: Secondary | ICD-10-CM

## 2018-02-24 DIAGNOSIS — E785 Hyperlipidemia, unspecified: Secondary | ICD-10-CM | POA: Diagnosis not present

## 2018-02-24 DIAGNOSIS — E669 Obesity, unspecified: Secondary | ICD-10-CM

## 2018-02-24 DIAGNOSIS — E119 Type 2 diabetes mellitus without complications: Secondary | ICD-10-CM

## 2018-02-24 MED ORDER — LISINOPRIL 2.5 MG PO TABS: 2.5000 mg | ORAL_TABLET | Freq: Every day | ORAL | 1 refills | Status: DC

## 2018-02-24 MED ORDER — GLIPIZIDE ER 5 MG PO TB24: 5.0000 mg | ORAL_TABLET | Freq: Two times a day (BID) | ORAL | 1 refills | Status: DC

## 2018-02-24 MED ORDER — AMLODIPINE BESYLATE 5 MG PO TABS: 5.0000 mg | ORAL_TABLET | Freq: Every day | ORAL | 1 refills | Status: DC

## 2018-02-24 MED ORDER — LEVOTHYROXINE SODIUM 75 MCG PO TABS: 75.0000 ug | ORAL_TABLET | Freq: Every day | ORAL | 1 refills | Status: DC

## 2018-02-24 NOTE — Assessment & Plan Note (Signed)
Encouraged DASH diet, decrease po intake and increase exercise as tolerated. Needs 7-8 hours of sleep nightly. Avoid trans fats, eat small, frequent meals every 4-5 hours with lean proteins, complex carbs and healthy fats. Minimize simple carbs 

## 2018-02-24 NOTE — Assessment & Plan Note (Signed)
Encouraged heart healthy diet, increase exercise, avoid trans fats, consider a krill oil cap daily 

## 2018-02-24 NOTE — Assessment & Plan Note (Signed)
hgba1c acceptable, minimize simple carbs. Increase exercise as tolerated.  

## 2018-02-24 NOTE — Assessment & Plan Note (Signed)
Well controlled, no changes to meds. Encouraged heart healthy diet such as the DASH diet and exercise as tolerated.  °

## 2018-02-24 NOTE — Assessment & Plan Note (Signed)
Patient encouraged to maintain heart healthy diet, regular exercise, adequate sleep. Consider daily probiotics. Take medications as prescribed 

## 2018-02-24 NOTE — Assessment & Plan Note (Signed)
Labs reveal deficiency. Start on Vitamin D 50000 IU caps, 1 cap po weekly x 12 weeks. Disp #4 with 4 rf. Also take daily Vitamin D over the counter. If already taking a daily supplement increase by 1000 IU daily and if not start Vitamin D 2000 IU daily.  

## 2018-02-24 NOTE — Assessment & Plan Note (Signed)
Pap today, no concerns on exam. No pelvic or pap in over 20 years will proceed with pap and pelvic

## 2018-02-24 NOTE — Assessment & Plan Note (Signed)
On Levothyroxine, continue to monitor 

## 2018-02-24 NOTE — Patient Instructions (Signed)
Preventive Care 40-64 Years, Female Preventive care refers to lifestyle choices and visits with your health care provider that can promote health and wellness. What does preventive care include?   A yearly physical exam. This is also called an annual well check.  Dental exams once or twice a year.  Routine eye exams. Ask your health care provider how often you should have your eyes checked.  Personal lifestyle choices, including: ? Daily care of your teeth and gums. ? Regular physical activity. ? Eating a healthy diet. ? Avoiding tobacco and drug use. ? Limiting alcohol use. ? Practicing safe sex. ? Taking low-dose aspirin daily starting at age 50. ? Taking vitamin and mineral supplements as recommended by your health care provider. What happens during an annual well check? The services and screenings done by your health care provider during your annual well check will depend on your age, overall health, lifestyle risk factors, and family history of disease. Counseling Your health care provider may ask you questions about your:  Alcohol use.  Tobacco use.  Drug use.  Emotional well-being.  Home and relationship well-being.  Sexual activity.  Eating habits.  Work and work environment.  Method of birth control.  Menstrual cycle.  Pregnancy history. Screening You may have the following tests or measurements:  Height, weight, and BMI.  Blood pressure.  Lipid and cholesterol levels. These may be checked every 5 years, or more frequently if you are over 50 years old.  Skin check.  Lung cancer screening. You may have this screening every year starting at age 55 if you have a 30-pack-year history of smoking and currently smoke or have quit within the past 15 years.  Colorectal cancer screening. All adults should have this screening starting at age 50 and continuing until age 75. Your health care provider may recommend screening at age 45. You will have tests every  1-10 years, depending on your results and the type of screening test. People at increased risk should start screening at an earlier age. Screening tests may include: ? Guaiac-based fecal occult blood testing. ? Fecal immunochemical test (FIT). ? Stool DNA test. ? Virtual colonoscopy. ? Sigmoidoscopy. During this test, a flexible tube with a tiny camera (sigmoidoscope) is used to examine your rectum and lower colon. The sigmoidoscope is inserted through your anus into your rectum and lower colon. ? Colonoscopy. During this test, a long, thin, flexible tube with a tiny camera (colonoscope) is used to examine your entire colon and rectum.  Hepatitis C blood test.  Hepatitis B blood test.  Sexually transmitted disease (STD) testing.  Diabetes screening. This is done by checking your blood sugar (glucose) after you have not eaten for a while (fasting). You may have this done every 1-3 years.  Mammogram. This may be done every 1-2 years. Talk to your health care provider about when you should start having regular mammograms. This may depend on whether you have a family history of breast cancer.  BRCA-related cancer screening. This may be done if you have a family history of breast, ovarian, tubal, or peritoneal cancers.  Pelvic exam and Pap test. This may be done every 3 years starting at age 21. Starting at age 30, this may be done every 5 years if you have a Pap test in combination with an HPV test.  Bone density scan. This is done to screen for osteoporosis. You may have this scan if you are at high risk for osteoporosis. Discuss your test results, treatment options,   and if necessary, the need for more tests with your health care provider. Vaccines Your health care provider may recommend certain vaccines, such as:  Influenza vaccine. This is recommended every year.  Tetanus, diphtheria, and acellular pertussis (Tdap, Td) vaccine. You may need a Td booster every 10 years.  Varicella  vaccine. You may need this if you have not been vaccinated.  Zoster vaccine. You may need this after age 38.  Measles, mumps, and rubella (MMR) vaccine. You may need at least one dose of MMR if you were born in 1957 or later. You may also need a second dose.  Pneumococcal 13-valent conjugate (PCV13) vaccine. You may need this if you have certain conditions and were not previously vaccinated.  Pneumococcal polysaccharide (PPSV23) vaccine. You may need one or two doses if you smoke cigarettes or if you have certain conditions.  Meningococcal vaccine. You may need this if you have certain conditions.  Hepatitis A vaccine. You may need this if you have certain conditions or if you travel or work in places where you may be exposed to hepatitis A.  Hepatitis B vaccine. You may need this if you have certain conditions or if you travel or work in places where you may be exposed to hepatitis B.  Haemophilus influenzae type b (Hib) vaccine. You may need this if you have certain conditions. Talk to your health care provider about which screenings and vaccines you need and how often you need them. This information is not intended to replace advice given to you by your health care provider. Make sure you discuss any questions you have with your health care provider. Document Released: 02/28/2015 Document Revised: 03/24/2017 Document Reviewed: 12/03/2014 Elsevier Interactive Patient Education  2019 [employer].

## 2018-02-26 NOTE — Progress Notes (Signed)
Subjective:    Patient ID: [name] [name], female    DOB: 1955-12-10, 63 y.o.   MRN: [id number]  No chief complaint on file.   HPI Patient is in today for annual preventative exam and pap smear. She feels well. No recent febrile illness or hospitalizations. No acute concerns. No gyn concerns. She is managing her activities of daily living. She is trying to maintain a heart healthy diet and stay active. Denies CP/palp/SOB/HA/congestion/fevers/GI or GU c/o. Taking meds as prescribed  Past Medical History:  Diagnosis Date  . Back pain 06/13/2015  . Diabetes mellitus    started Metformin 12/2012  . History of shingles 2012   back  . Hyperlipidemia   . Hypertension   . Hypothyroidism 07/29/2010   S/p FNA showing non-neoplastic goiter 7/12 w/ Dr Pollyann Kennedy   . Obesity 08/06/2016  . Osteoporosis 08/06/2016  . Vitamin D deficiency 08/06/2016    Past Surgical History:  Procedure Laterality Date  . ABDOMINAL HYSTERECTOMY  1997  . APPENDECTOMY    . MASS EXCISION  03/02/2011   Procedure: EXCISION MASS;  Surgeon: Nicki Reaper, MD;  Location: [facility];  Service: Orthopedics;  Laterality: Right;  Excision Cyst Interphangeal Right Thumb  . OOPHORECTOMY    . SHOULDER ARTHROSCOPY W/ ROTATOR CUFF REPAIR  2007   rt  . TONSILLECTOMY    . TRIGGER FINGER RELEASE  11/01/2011   Procedure: RELEASE TRIGGER FINGER/A-1 PULLEY;  Surgeon: Nicki Reaper, MD;  Location: [facility];  Service: Orthopedics;  Laterality: Right;  right thumb, possible excision cyst    Family History  Problem Relation Age of Onset  . Hypertension Mother   . Diabetes Mother   . Breast cancer Mother 10  . Other Mother   . Hypertension Father   . Cancer Father        throat  . Alcohol abuse Father   . Hypertension Brother   . Heart disease Brother        heart murmur  . Alcohol abuse Brother   . Cancer Maternal Grandmother   . Heart disease Maternal Grandfather   . Heart disease Paternal  Grandmother   . Cancer Maternal Aunt   . Breast cancer Maternal Aunt     Social History   Socioeconomic History  . Marital status: Married    Spouse name: Not on file  . Number of children: Not on file  . Years of education: Not on file  . Highest education level: Not on file  Occupational History  . Occupation: Physiological scientist  Social Needs  . Financial resource strain: Not on file  . Food insecurity:    Worry: Not on file    Inability: Not on file  . Transportation needs:    Medical: Not on file    Non-medical: Not on file  Tobacco Use  . Smoking status: Never Smoker  . Smokeless tobacco: Never Used  Substance and Sexual Activity  . Alcohol use: No  . Drug use: No  . Sexual activity: Yes    Partners: Male    Comment: lives with husband, works at [employer], no major dietary   Lifestyle  . Physical activity:    Days per week: Not on file    Minutes per session: Not on file  . Stress: Not on file  Relationships  . Social connections:    Talks on phone: Not on file    Gets together: Not on file    Attends religious  service: Not on file    Active member of club or organization: Not on file    Attends meetings of clubs or organizations: Not on file    Relationship status: Not on file  . Intimate partner violence:    Fear of current or ex partner: Not on file    Emotionally abused: Not on file    Physically abused: Not on file    Forced sexual activity: Not on file  Other Topics Concern  . Not on file  Social History Narrative   Married; step daughter   Regular exercise: walk 2 days a week   Caffeine use: 2 cups of coffee daily    Outpatient Medications Prior to Visit  Medication Sig Dispense Refill  . Ascorbic Acid (VITAMIN C) 1000 MG tablet Take 1,000 mg by mouth daily.      Marland Kitchen aspirin 81 MG EC tablet Take 81 mg by mouth daily.      . calcium carbonate (CALCIUM 500) 1250 MG tablet Take 1 tablet by mouth daily.      . meloxicam (MOBIC) 15 MG tablet  Take 1 tablet (15 mg total) by mouth daily. 30 tablet 2  . Multiple Vitamin (MULTIVITAMIN) tablet Take 1 tablet by mouth daily.      . Omega-3 Fatty Acids (FISH OIL PO) Take by mouth daily.      Marland Kitchen triamcinolone cream (KENALOG) 0.1 % Apply 1 application topically 2 (two) times daily. 80 g 2  . amLODipine (NORVASC) 5 MG tablet Take 1 tablet (5 mg total) by mouth daily. 90 tablet 1  . glipiZIDE (GLUCOTROL XL) 5 MG 24 hr tablet Take 1 tablet (5 mg total) by mouth 2 (two) times daily. 180 tablet 1  . levothyroxine (SYNTHROID, LEVOTHROID) 75 MCG tablet Take 1 tablet (75 mcg total) by mouth daily before breakfast. 90 tablet 1  . lisinopril (PRINIVIL,ZESTRIL) 2.5 MG tablet Take 1 tablet (2.5 mg total) by mouth daily. 90 tablet 1   No facility-administered medications prior to visit.     Allergies  Allergen Reactions  . Codeine   . Tramadol     Review of Systems  Constitutional: Negative for fever and malaise/fatigue.  HENT: Negative for congestion.   Eyes: Negative for blurred vision.  Respiratory: Negative for shortness of breath.   Cardiovascular: Negative for chest pain, palpitations and leg swelling.  Gastrointestinal: Negative for abdominal pain, blood in stool and nausea.  Genitourinary: Negative for dysuria and frequency.  Musculoskeletal: Negative for falls.  Skin: Negative for rash.  Neurological: Negative for dizziness, loss of consciousness and headaches.  Endo/Heme/Allergies: Negative for environmental allergies.  Psychiatric/Behavioral: Negative for depression. The patient is not nervous/anxious.        Objective:    Physical Exam  BP 122/82 (BP Location: Left Arm, Patient Position: Sitting, Cuff Size: Normal)   Pulse 80   Temp 98 F (36.7 C) (Oral)   Resp 18   Ht 5\' 7"  (1.702 m)   Wt 206 lb 3.2 oz (93.5 kg)   SpO2 98%   BMI 32.30 kg/m  Wt Readings from Last 3 Encounters:  02/24/18 206 lb 3.2 oz (93.5 kg)  12/15/17 207 lb 9.6 oz (94.2 kg)  08/22/17 209 lb 12.8  oz (95.2 kg)     Lab Results  Component Value Date   WBC 8.5 12/15/2017   HGB 15.3 (H) 12/15/2017   HCT 45.5 12/15/2017   PLT 289.0 12/15/2017   GLUCOSE 107 (H) 12/15/2017   CHOL 219 (H) 12/15/2017  TRIG 168.0 (H) 12/15/2017   HDL 59.70 12/15/2017   LDLCALC 126 (H) 12/15/2017   ALT 20 12/15/2017   AST 17 12/15/2017   NA 139 12/15/2017   K 3.9 12/15/2017   CL 100 12/15/2017   CREATININE 0.79 12/15/2017   BUN 13 12/15/2017   CO2 28 12/15/2017   TSH 2.15 12/15/2017   HGBA1C 7.7 (H) 12/16/2017   MICROALBUR <0.7 08/22/2017    Lab Results  Component Value Date   TSH 2.15 12/15/2017   Lab Results  Component Value Date   WBC 8.5 12/15/2017   HGB 15.3 (H) 12/15/2017   HCT 45.5 12/15/2017   MCV 85.4 12/15/2017   PLT 289.0 12/15/2017   Lab Results  Component Value Date   NA 139 12/15/2017   K 3.9 12/15/2017   CO2 28 12/15/2017   GLUCOSE 107 (H) 12/15/2017   BUN 13 12/15/2017   CREATININE 0.79 12/15/2017   BILITOT 0.5 12/15/2017   ALKPHOS 86 12/15/2017   AST 17 12/15/2017   ALT 20 12/15/2017   PROT 7.4 12/15/2017   ALBUMIN 5.2 12/15/2017   CALCIUM 10.2 12/15/2017   GFR 77.91 12/15/2017   Lab Results  Component Value Date   CHOL 219 (H) 12/15/2017   Lab Results  Component Value Date   HDL 59.70 12/15/2017   Lab Results  Component Value Date   LDLCALC 126 (H) 12/15/2017   Lab Results  Component Value Date   TRIG 168.0 (H) 12/15/2017   Lab Results  Component Value Date   CHOLHDL 4 12/15/2017   Lab Results  Component Value Date   HGBA1C 7.7 (H) 12/16/2017       Assessment & Plan:   Problem List Items Addressed This Visit    Essential hypertension    Well controlled, no changes to meds. Encouraged heart healthy diet such as the DASH diet and exercise as tolerated.       Relevant Medications   lisinopril (PRINIVIL,ZESTRIL) 2.5 MG tablet   amLODipine (NORVASC) 5 MG tablet   Other Relevant Orders   CBC   Comprehensive metabolic panel   TSH     CBC   Comprehensive metabolic panel   TSH   Annual physical exam    Patient encouraged to maintain heart healthy diet, regular exercise, adequate sleep. Consider daily probiotics. Take medications as prescribed.      Hypothyroidism    On Levothyroxine, continue to monitor      Relevant Medications   levothyroxine (SYNTHROID, LEVOTHROID) 75 MCG tablet   DM type 2 (diabetes mellitus, type 2) (HCC)    hgba1c acceptable, minimize simple carbs. Increase exercise as tolerated.       Relevant Medications   lisinopril (PRINIVIL,ZESTRIL) 2.5 MG tablet   glipiZIDE (GLUCOTROL XL) 5 MG 24 hr tablet   Other Relevant Orders   Hemoglobin A1c   Hemoglobin A1c   Hyperlipidemia with target LDL less than 100    Encouraged heart healthy diet, increase exercise, avoid trans fats, consider a krill oil cap daily      Relevant Medications   lisinopril (PRINIVIL,ZESTRIL) 2.5 MG tablet   amLODipine (NORVASC) 5 MG tablet   Other Relevant Orders   Lipid panel   Lipid panel   Vitamin D deficiency    Labs reveal deficiency. Start on Vitamin D 66060 IU caps, 1 cap po weekly x 12 weeks. Disp #4 with 4 rf. Also take daily Vitamin D over the counter. If already taking a daily supplement increase by 1000 IU daily  and if not start Vitamin D 2000 IU daily.       Relevant Orders   VITAMIN D 25 Hydroxy (Vit-D Deficiency, Fractures)   VITAMIN D 25 Hydroxy (Vit-D Deficiency, Fractures)   Obesity    Encouraged DASH diet, decrease po intake and increase exercise as tolerated. Needs 7-8 hours of sleep nightly. Avoid trans fats, eat small, frequent meals every 4-5 hours with lean proteins, complex carbs and healthy fats. Minimize simple carbs      Relevant Medications   glipiZIDE (GLUCOTROL XL) 5 MG 24 hr tablet   Cervical cancer screening    Pap today, no concerns on exam. No pelvic or pap in over 20 years will proceed with pap and pelvic      Relevant Orders   Cytology - PAP( [place])      I am  having Elease HashimotoPatricia C. Ivey maintain her aspirin, calcium carbonate, Omega-3 Fatty Acids (FISH OIL PO), multivitamin, vitamin C, triamcinolone cream, meloxicam, lisinopril, amLODipine, glipiZIDE, and levothyroxine.  Meds ordered this encounter  Medications  . lisinopril (PRINIVIL,ZESTRIL) 2.5 MG tablet    Sig: Take 1 tablet (2.5 mg total) by mouth daily.    Dispense:  90 tablet    Refill:  1  . amLODipine (NORVASC) 5 MG tablet    Sig: Take 1 tablet (5 mg total) by mouth daily.    Dispense:  90 tablet    Refill:  1  . glipiZIDE (GLUCOTROL XL) 5 MG 24 hr tablet    Sig: Take 1 tablet (5 mg total) by mouth 2 (two) times daily.    Dispense:  180 tablet    Refill:  1  . levothyroxine (SYNTHROID, LEVOTHROID) 75 MCG tablet    Sig: Take 1 tablet (75 mcg total) by mouth daily before breakfast.    Dispense:  90 tablet    Refill:  1     Danise EdgeStacey Blyth, MD

## 2018-02-27 LAB — CYTOLOGY - PAP: DIAGNOSIS: NEGATIVE

## 2018-03-27 ENCOUNTER — Other Ambulatory Visit (INDEPENDENT_AMBULATORY_CARE_PROVIDER_SITE_OTHER): Payer: BLUE CROSS/BLUE SHIELD

## 2018-03-27 DIAGNOSIS — E119 Type 2 diabetes mellitus without complications: Secondary | ICD-10-CM | POA: Diagnosis not present

## 2018-03-27 DIAGNOSIS — E785 Hyperlipidemia, unspecified: Secondary | ICD-10-CM | POA: Diagnosis not present

## 2018-03-27 DIAGNOSIS — E559 Vitamin D deficiency, unspecified: Secondary | ICD-10-CM | POA: Diagnosis not present

## 2018-03-27 DIAGNOSIS — I10 Essential (primary) hypertension: Secondary | ICD-10-CM

## 2018-03-27 LAB — CBC
HCT: 43.5 % (ref 36.0–46.0)
Hemoglobin: 14.3 g/dL (ref 12.0–15.0)
MCHC: 33 g/dL (ref 30.0–36.0)
MCV: 87.1 fl (ref 78.0–100.0)
Platelets: 248 10*3/uL (ref 150.0–400.0)
RBC: 4.99 Mil/uL (ref 3.87–5.11)
RDW: 12.9 % (ref 11.5–15.5)
WBC: 10.7 10*3/uL — ABNORMAL HIGH (ref 4.0–10.5)

## 2018-03-27 LAB — LIPID PANEL
CHOLESTEROL: 173 mg/dL (ref 0–200)
HDL: 48.8 mg/dL (ref >39.00)
LDL CALC: 97 mg/dL (ref 0–99)
NonHDL: 124.43
Total CHOL/HDL Ratio: 4
Triglycerides: 138 mg/dL (ref 0.0–149.0)
VLDL: 27.6 mg/dL (ref 0.0–40.0)

## 2018-03-27 LAB — COMPREHENSIVE METABOLIC PANEL
ALBUMIN: 4.2 g/dL (ref 3.5–5.2)
ALT: 17 U/L (ref 0–35)
AST: 14 U/L (ref 0–37)
Alkaline Phosphatase: 67 U/L (ref 39–117)
BILIRUBIN TOTAL: 0.4 mg/dL (ref 0.2–1.2)
BUN: 22 mg/dL (ref 6–23)
CO2: 25 mEq/L (ref 19–32)
Calcium: 8.6 mg/dL (ref 8.4–10.5)
Chloride: 104 mEq/L (ref 96–112)
Creatinine, Ser: 0.7 mg/dL (ref 0.40–1.20)
GFR: 84.21 mL/min (ref >60.00)
Glucose, Bld: 166 mg/dL — ABNORMAL HIGH (ref 70–99)
Potassium: 4 mEq/L (ref 3.5–5.1)
Sodium: 139 mEq/L (ref 135–145)
Total Protein: 6.1 g/dL (ref 6.0–8.3)

## 2018-03-27 LAB — TSH: TSH: 1.77 u[IU]/mL (ref 0.35–4.50)

## 2018-03-27 LAB — HEMOGLOBIN A1C: Hgb A1c MFr Bld: 7.7 % — ABNORMAL HIGH (ref 4.6–6.5)

## 2018-03-27 LAB — VITAMIN D 25 HYDROXY (VIT D DEFICIENCY, FRACTURES): VITD: 29.93 ng/mL — AB (ref 30.00–100.00)

## 2018-04-24 DIAGNOSIS — Z7984 Long term (current) use of oral hypoglycemic drugs: Secondary | ICD-10-CM | POA: Diagnosis not present

## 2018-04-24 DIAGNOSIS — Z9889 Other specified postprocedural states: Secondary | ICD-10-CM | POA: Diagnosis not present

## 2018-04-24 DIAGNOSIS — H2513 Age-related nuclear cataract, bilateral: Secondary | ICD-10-CM | POA: Diagnosis not present

## 2018-04-24 DIAGNOSIS — E1136 Type 2 diabetes mellitus with diabetic cataract: Secondary | ICD-10-CM | POA: Diagnosis not present

## 2018-04-28 ENCOUNTER — Telehealth: Payer: Self-pay | Admitting: *Deleted

## 2018-04-28 NOTE — Telephone Encounter (Signed)
Received Diabetic Eye Exam Report from [facility]; forwarded to provider/SLS 03/13

## 2018-06-12 ENCOUNTER — Ambulatory Visit (INDEPENDENT_AMBULATORY_CARE_PROVIDER_SITE_OTHER): Payer: BLUE CROSS/BLUE SHIELD | Admitting: Family Medicine

## 2018-06-12 ENCOUNTER — Other Ambulatory Visit: Payer: Self-pay

## 2018-06-12 ENCOUNTER — Telehealth: Payer: Self-pay | Admitting: *Deleted

## 2018-06-12 DIAGNOSIS — M25562 Pain in left knee: Secondary | ICD-10-CM

## 2018-06-12 DIAGNOSIS — I10 Essential (primary) hypertension: Secondary | ICD-10-CM | POA: Diagnosis not present

## 2018-06-12 DIAGNOSIS — G8929 Other chronic pain: Secondary | ICD-10-CM | POA: Diagnosis not present

## 2018-06-12 MED ORDER — DICLOFENAC SODIUM 75 MG PO TBEC: 75.0000 mg | DELAYED_RELEASE_TABLET | Freq: Two times a day (BID) | ORAL | 2 refills | Status: DC | PRN

## 2018-06-12 NOTE — Progress Notes (Signed)
Virtual Visit via Video Note  I connected with [name] [name] on 06/12/18 at  3:40 PM EDT by a video enabled telemedicine application and verified that I am speaking with the correct person using two identifiers.   I discussed the limitations of evaluation and management by telemedicine and the availability of in person appointments. The patient expressed understanding and agreed to proceed. Crissie Sickles, CMA was able to get the patient set up on the video platform    Subjective:    Patient ID: [name] [name], female    DOB: [birth year], 63 y.o.   MRN: [id number]  No chief complaint on file.   HPI Patient is in today for reevaluation of left knee pain. She has been struggling with left knee pain for a long time but over the past few weeks it has been getting worse tot he point where she is having more trouble with walking. The pain and swelling are over the medical knee. No recent fall or trauma. Denies CP/palp/SOB/HA/congestion/fevers/GI or GU c/o. Taking meds as prescribed  Past Medical History:  Diagnosis Date  . Back pain 06/13/2015  . Diabetes mellitus    started Metformin 12/2012  . History of shingles 2012   back  . Hyperlipidemia   . Hypertension   . Hypothyroidism 07/29/2010   S/p FNA showing non-neoplastic goiter 7/12 w/ Dr Pollyann Kennedy   . Obesity 08/06/2016  . Osteoporosis 08/06/2016  . Vitamin D deficiency 08/06/2016    Past Surgical History:  Procedure Laterality Date  . ABDOMINAL HYSTERECTOMY  1997  . APPENDECTOMY    . MASS EXCISION  03/02/2011   Procedure: EXCISION MASS;  Surgeon: Nicki Reaper, MD;  Location: [facility];  Service: Orthopedics;  Laterality: Right;  Excision Cyst Interphangeal Right Thumb  . OOPHORECTOMY    . SHOULDER ARTHROSCOPY W/ ROTATOR CUFF REPAIR  2007   rt  . TONSILLECTOMY    . TRIGGER FINGER RELEASE  11/01/2011   Procedure: RELEASE TRIGGER FINGER/A-1 PULLEY;  Surgeon: Nicki Reaper, MD;  Location: [facility];   Service: Orthopedics;  Laterality: Right;  right thumb, possible excision cyst    Family History  Problem Relation Age of Onset  . Hypertension Mother   . Diabetes Mother   . Breast cancer Mother 57  . Other Mother   . Hypertension Father   . Cancer Father        throat  . Alcohol abuse Father   . Hypertension Brother   . Heart disease Brother        heart murmur  . Alcohol abuse Brother   . Cancer Maternal Grandmother   . Heart disease Maternal Grandfather   . Heart disease Paternal Grandmother   . Cancer Maternal Aunt   . Breast cancer Maternal Aunt     Social History   Socioeconomic History  . Marital status: Married    Spouse name: Not on file  . Number of children: Not on file  . Years of education: Not on file  . Highest education level: Not on file  Occupational History  . Occupation: Physiological scientist  Social Needs  . Financial resource strain: Not on file  . Food insecurity:    Worry: Not on file    Inability: Not on file  . Transportation needs:    Medical: Not on file    Non-medical: Not on file  Tobacco Use  . Smoking status: Never Smoker  . Smokeless tobacco: Never Used  Substance and Sexual  Activity  . Alcohol use: No  . Drug use: No  . 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 Stress: Not on file  Relationships  . Social connections:    Talks on phone: Not on file    Gets together: Not on file    Attends religious service: Not on file    Active member of club or organization: Not on file    Attends meetings of clubs or organizations: Not on file    Relationship status: Not on file  . Intimate partner violence:    Fear of current or ex partner: Not on file    Emotionally abused: Not on file    Physically abused: Not on file    Forced sexual activity: Not on file   Other Topics Concern  . Not on file  Social History Narrative   Married; step daughter   Regular exercise: walk 2 days a week   Caffeine use: 2 cups of coffee daily    Outpatient Medications Prior to Visit  Medication Sig Dispense Refill  . amLODipine (NORVASC) 5 MG tablet Take 1 tablet (5 mg total) by mouth daily. 90 tablet 1  . Ascorbic Acid (VITAMIN C) 1000 MG tablet Take 1,000 mg by mouth daily.      Marland Kitchen aspirin 81 MG EC tablet Take 81 mg by mouth daily.      . calcium carbonate (CALCIUM 500) 1250 MG tablet Take 1 tablet by mouth daily.      Marland Kitchen glipiZIDE (GLUCOTROL XL) 5 MG 24 hr tablet Take 1 tablet (5 mg total) by mouth 2 (two) times daily. 180 tablet 1  . levothyroxine (SYNTHROID, LEVOTHROID) 75 MCG tablet Take 1 tablet (75 mcg total) by mouth daily before breakfast. 90 tablet 1  . lisinopril (PRINIVIL,ZESTRIL) 2.5 MG tablet Take 1 tablet (2.5 mg total) by mouth daily. 90 tablet 1  . meloxicam (MOBIC) 15 MG tablet Take 1 tablet (15 mg total) by mouth daily. 30 tablet 2  . Multiple Vitamin (MULTIVITAMIN) tablet Take 1 tablet by mouth daily.      . Omega-3 Fatty Acids (FISH OIL PO) Take by mouth daily.      Marland Kitchen triamcinolone cream (KENALOG) 0.1 % Apply 1 application topically 2 (two) times daily. 80 g 2   No facility-administered medications prior to visit.     Allergies  Allergen Reactions  . Codeine   . Tramadol     Review of Systems  Constitutional: Negative for fever and malaise/fatigue.  HENT: Negative for congestion.   Eyes: Negative for blurred vision.  Respiratory: Negative for shortness of breath.   Cardiovascular: Negative for chest pain, palpitations and leg swelling.  Gastrointestinal: Negative for abdominal pain, blood in stool and nausea.  Genitourinary: Negative for dysuria and frequency.  Musculoskeletal: Positive for joint pain. Negative for falls.  Skin: Negative for rash.  Neurological: Negative for dizziness, loss of consciousness and headaches.   Endo/Heme/Allergies: Negative for environmental allergies.  Psychiatric/Behavioral: Negative for depression. The patient is not nervous/anxious.        Objective:    Physical Exam Constitutional:      Appearance: Normal appearance. She is not ill-appearing.  HENT:     Head: Normocephalic and atraumatic.  Pulmonary:     Effort:  Pulmonary effort is normal.  Musculoskeletal:     Comments: Swelling along medial aspect of left knee  Skin:    General: Skin is dry.  Neurological:     Mental Status: She is alert and oriented to person, place, and time.  Psychiatric:        Mood and Affect: Mood normal.        Behavior: Behavior normal.     There were no vitals taken for this visit. Wt Readings from Last 3 Encounters:  02/24/18 206 lb 3.2 oz (93.5 kg)  12/15/17 207 lb 9.6 oz (94.2 kg)  08/22/17 209 lb 12.8 oz (95.2 kg)    Diabetic Foot Exam - Simple   No data filed     Lab Results  Component Value Date   WBC 10.7 (H) 03/27/2018   HGB 14.3 03/27/2018   HCT 43.5 03/27/2018   PLT 248.0 03/27/2018   GLUCOSE 166 (H) 03/27/2018   CHOL 173 03/27/2018   TRIG 138.0 03/27/2018   HDL 48.80 03/27/2018   LDLCALC 97 03/27/2018   ALT 17 03/27/2018   AST 14 03/27/2018   NA 139 03/27/2018   K 4.0 03/27/2018   CL 104 03/27/2018   CREATININE 0.70 03/27/2018   BUN 22 03/27/2018   CO2 25 03/27/2018   TSH 1.77 03/27/2018   HGBA1C 7.7 (H) 03/27/2018   MICROALBUR <0.7 08/22/2017    Lab Results  Component Value Date   TSH 1.77 03/27/2018   Lab Results  Component Value Date   WBC 10.7 (H) 03/27/2018   HGB 14.3 03/27/2018   HCT 43.5 03/27/2018   MCV 87.1 03/27/2018   PLT 248.0 03/27/2018   Lab Results  Component Value Date   NA 139 03/27/2018   K 4.0 03/27/2018   CO2 25 03/27/2018   GLUCOSE 166 (H) 03/27/2018   BUN 22 03/27/2018   CREATININE 0.70 03/27/2018   BILITOT 0.4 03/27/2018   ALKPHOS 67 03/27/2018   AST 14 03/27/2018   ALT 17 03/27/2018   PROT 6.1 03/27/2018    ALBUMIN 4.2 03/27/2018   CALCIUM 8.6 03/27/2018   GFR 84.21 03/27/2018   Lab Results  Component Value Date   CHOL 173 03/27/2018   Lab Results  Component Value Date   HDL 48.80 03/27/2018   Lab Results  Component Value Date   LDLCALC 97 03/27/2018   Lab Results  Component Value Date   TRIG 138.0 03/27/2018   Lab Results  Component Value Date   CHOLHDL 4 03/27/2018   Lab Results  Component Value Date   HGBA1C 7.7 (H) 03/27/2018       Assessment & Plan:   Problem List Items Addressed This Visit    Essential hypertension    no changes to meds. Encouraged heart healthy diet such as the DASH diet and exercise as tolerated.       Left knee pain - Primary    Pain with swelling noted medially. Meloxicam no longer working adequately, will switch to Diclofenac 75 mg to use twice daily with Tylenol up to 3 x a day and topical lidocaine. Referred to ortho, Dr Despina Hick secondary to pain worsening and causing dysfunction      Relevant Orders   Ambulatory referral to Orthopedic Surgery      I am having Elease Hashimoto C. Burback start on diclofenac. I am also having her maintain her aspirin, calcium carbonate, Omega-3 Fatty Acids (FISH OIL PO), multivitamin, vitamin C, triamcinolone cream, meloxicam, lisinopril, amLODipine, glipiZIDE, and levothyroxine.  Meds  ordered this encounter  Medications  . diclofenac (VOLTAREN) 75 MG EC tablet    Sig: Take 1 tablet (75 mg total) by mouth 2 (two) times daily as needed.    Dispense:  60 tablet    Refill:  2   I discussed the assessment and treatment plan with the patient. The patient was provided an opportunity to ask questions and all were answered. The patient agreed with the plan and demonstrated an understanding of the instructions.   The patient was advised to call back or seek an in-person evaluation if the symptoms worsen or if the condition fails to improve as anticipated.  I provided 15 minutes of non-face-to-face time during this  encounter.   Danise EdgeStacey Blyth, MD

## 2018-06-12 NOTE — Telephone Encounter (Signed)
Spoke with pt and schedule Virtual Visit with PCP today at 3:40pm and advised pt I am unsure of ortho availability and PCP would need to evaluate her knee first and may be able to offer comfort measures until referral can be made.  Copied from CRM [id number]. Topic: Referral - Request for Referral >> Jun 12, 2018  8:08 AM Wyonia Hough E wrote: Has patient seen PCP for this complaint? Yes on 10.31.2019 *If NO, is insurance requiring patient see PCP for this issue before PCP can refer them? Referral for which specialty: orthopedic  Preferred provider/office: [facility] orthopedic / Dr. Ollen Gross (she will see anyone if he's not available)  Reason for referral: arthritis in left knee  Pt is in severe pain and knee has flared up

## 2018-06-12 NOTE — Assessment & Plan Note (Signed)
Pain with swelling noted medially. Meloxicam no longer working adequately, will switch to Diclofenac 75 mg to use twice daily with Tylenol up to 3 x a day and topical lidocaine. Referred to ortho, Dr Despina Hick secondary to pain worsening and causing dysfunction

## 2018-06-12 NOTE — Assessment & Plan Note (Signed)
no changes to meds. Encouraged heart healthy diet such as the DASH diet and exercise as tolerated.  

## 2018-06-21 DIAGNOSIS — M25562 Pain in left knee: Secondary | ICD-10-CM | POA: Diagnosis not present

## 2018-06-21 DIAGNOSIS — M1712 Unilateral primary osteoarthritis, left knee: Secondary | ICD-10-CM | POA: Diagnosis not present

## 2018-06-21 DIAGNOSIS — M238X2 Other internal derangements of left knee: Secondary | ICD-10-CM | POA: Diagnosis not present

## 2018-07-03 DIAGNOSIS — M25562 Pain in left knee: Secondary | ICD-10-CM | POA: Diagnosis not present

## 2018-07-06 DIAGNOSIS — M2242 Chondromalacia patellae, left knee: Secondary | ICD-10-CM | POA: Diagnosis not present

## 2018-07-06 DIAGNOSIS — M23222 Derangement of posterior horn of medial meniscus due to old tear or injury, left knee: Secondary | ICD-10-CM | POA: Diagnosis not present

## 2018-07-06 DIAGNOSIS — M25562 Pain in left knee: Secondary | ICD-10-CM | POA: Diagnosis not present

## 2018-07-31 ENCOUNTER — Other Ambulatory Visit: Payer: Self-pay | Admitting: Family Medicine

## 2018-07-31 DIAGNOSIS — Z1231 Encounter for screening mammogram for malignant neoplasm of breast: Secondary | ICD-10-CM

## 2018-08-22 ENCOUNTER — Other Ambulatory Visit: Payer: Self-pay

## 2018-08-25 ENCOUNTER — Other Ambulatory Visit: Payer: BC Managed Care – PPO

## 2018-08-28 ENCOUNTER — Ambulatory Visit: Payer: BLUE CROSS/BLUE SHIELD | Admitting: Family Medicine

## 2018-08-29 ENCOUNTER — Other Ambulatory Visit: Payer: Self-pay | Admitting: Family Medicine

## 2018-08-29 ENCOUNTER — Other Ambulatory Visit: Payer: Self-pay

## 2018-08-29 ENCOUNTER — Other Ambulatory Visit (INDEPENDENT_AMBULATORY_CARE_PROVIDER_SITE_OTHER): Payer: BC Managed Care – PPO

## 2018-08-29 DIAGNOSIS — E559 Vitamin D deficiency, unspecified: Secondary | ICD-10-CM | POA: Diagnosis not present

## 2018-08-29 DIAGNOSIS — E119 Type 2 diabetes mellitus without complications: Secondary | ICD-10-CM

## 2018-08-29 DIAGNOSIS — E785 Hyperlipidemia, unspecified: Secondary | ICD-10-CM

## 2018-08-29 DIAGNOSIS — I10 Essential (primary) hypertension: Secondary | ICD-10-CM

## 2018-08-29 DIAGNOSIS — E038 Other specified hypothyroidism: Secondary | ICD-10-CM

## 2018-08-29 LAB — CBC
HCT: 42.5 % (ref 36.0–46.0)
Hemoglobin: 14.3 g/dL (ref 12.0–15.0)
MCHC: 33.7 g/dL (ref 30.0–36.0)
MCV: 86.9 fl (ref 78.0–100.0)
Platelets: 250 10*3/uL (ref 150.0–400.0)
RBC: 4.89 Mil/uL (ref 3.87–5.11)
RDW: 12.8 % (ref 11.5–15.5)
WBC: 7.5 10*3/uL (ref 4.0–10.5)

## 2018-08-29 LAB — COMPREHENSIVE METABOLIC PANEL
ALT: 19 U/L (ref 0–35)
AST: 15 U/L (ref 0–37)
Albumin: 4.7 g/dL (ref 3.5–5.2)
Alkaline Phosphatase: 53 U/L (ref 39–117)
BUN: 11 mg/dL (ref 6–23)
CO2: 28 mEq/L (ref 19–32)
Calcium: 9.1 mg/dL (ref 8.4–10.5)
Chloride: 101 mEq/L (ref 96–112)
Creatinine, Ser: 0.66 mg/dL (ref 0.40–1.20)
GFR: 90 mL/min (ref >60.00)
Glucose, Bld: 141 mg/dL — ABNORMAL HIGH (ref 70–99)
Potassium: 4.3 mEq/L (ref 3.5–5.1)
Sodium: 138 mEq/L (ref 135–145)
Total Bilirubin: 0.6 mg/dL (ref 0.2–1.2)
Total Protein: 6.7 g/dL (ref 6.0–8.3)

## 2018-08-29 LAB — LIPID PANEL
Cholesterol: 193 mg/dL (ref 0–200)
HDL: 57.3 mg/dL (ref >39.00)
LDL Cholesterol: 113 mg/dL — ABNORMAL HIGH (ref 0–99)
NonHDL: 135.34
Total CHOL/HDL Ratio: 3
Triglycerides: 114 mg/dL (ref 0.0–149.0)
VLDL: 22.8 mg/dL (ref 0.0–40.0)

## 2018-08-29 LAB — VITAMIN D 25 HYDROXY (VIT D DEFICIENCY, FRACTURES): VITD: 32.13 ng/mL (ref 30.00–100.00)

## 2018-08-29 LAB — TSH: TSH: 1.99 u[IU]/mL (ref 0.35–4.50)

## 2018-08-29 LAB — HEMOGLOBIN A1C: Hgb A1c MFr Bld: 7.4 % — ABNORMAL HIGH (ref 4.6–6.5)

## 2018-08-31 ENCOUNTER — Other Ambulatory Visit: Payer: Self-pay

## 2018-08-31 ENCOUNTER — Telehealth: Payer: Self-pay

## 2018-08-31 ENCOUNTER — Ambulatory Visit (INDEPENDENT_AMBULATORY_CARE_PROVIDER_SITE_OTHER): Payer: BC Managed Care – PPO | Admitting: Family Medicine

## 2018-08-31 VITALS — HR 75

## 2018-08-31 DIAGNOSIS — E119 Type 2 diabetes mellitus without complications: Secondary | ICD-10-CM

## 2018-08-31 DIAGNOSIS — R42 Dizziness and giddiness: Secondary | ICD-10-CM

## 2018-08-31 DIAGNOSIS — I10 Essential (primary) hypertension: Secondary | ICD-10-CM | POA: Diagnosis not present

## 2018-08-31 DIAGNOSIS — E785 Hyperlipidemia, unspecified: Secondary | ICD-10-CM

## 2018-08-31 DIAGNOSIS — M810 Age-related osteoporosis without current pathological fracture: Secondary | ICD-10-CM

## 2018-08-31 DIAGNOSIS — E038 Other specified hypothyroidism: Secondary | ICD-10-CM

## 2018-08-31 DIAGNOSIS — E559 Vitamin D deficiency, unspecified: Secondary | ICD-10-CM | POA: Diagnosis not present

## 2018-08-31 MED ORDER — MECLIZINE HCL 25 MG PO TABS: 25.0000 mg | ORAL_TABLET | Freq: Three times a day (TID) | ORAL | 1 refills | Status: DC | PRN

## 2018-08-31 NOTE — Telephone Encounter (Signed)
Patient needs prolia  Please advise

## 2018-09-03 DIAGNOSIS — R42 Dizziness and giddiness: Secondary | ICD-10-CM | POA: Insufficient documentation

## 2018-09-03 NOTE — Assessment & Plan Note (Signed)
Encouraged heart healthy diet, increase exercise, avoid trans fats, consider a krill oil cap daily 

## 2018-09-03 NOTE — Assessment & Plan Note (Signed)
Encouraged to check vitals weekly, no changes to meds. Encouraged heart healthy diet such as the DASH diet and exercise as tolerated.  

## 2018-09-03 NOTE — Assessment & Plan Note (Signed)
Supplement and monitor 

## 2018-09-03 NOTE — Assessment & Plan Note (Signed)
On Levothyroxine, continue to monitor 

## 2018-09-03 NOTE — Progress Notes (Signed)
Virtual Visit via Video Note  I connected with [name] FesterPatricia C [name] on 08/31/18 at  1:20 PM EDT by a video enabled telemedicine application and verified that I am speaking with the correct person using two identifiers.  Location: Patient: home Provider: home   I discussed the limitations of evaluation and management by telemedicine and the availability of in person appointments. The patient expressed understanding and agreed to proceed. [name] CMA was able to get patient set up on video visit    Subjective:    Patient ID: [name] [name], female    DOB: [birth year], 63 y.o.   MRN: [id number]  No chief complaint on file.   HPI Patient is in today for follow up on chronic medical concerns including hypothyroidism, hypertension, vertigo, osteoporosis and more. She feels well today. No recent febrile illness or hospitalizations. Is staying active but quarantining well during pandemic. Eating well. Is working with Dr Charlann Boxerlin of orthopaedics on her left knee pain. She has had 2 episodes of vertigo but no falls, n/v, or other neurologic symptoms associated. Denies CP/palp/SOB/HA/congestion/fevers/GI or GU c/o. Taking meds as prescribed  Past Medical History:  Diagnosis Date  . Back pain 06/13/2015  . Diabetes mellitus    started Metformin 12/2012  . History of shingles 2012   back  . Hyperlipidemia   . Hypertension   . Hypothyroidism 07/29/2010   S/p FNA showing non-neoplastic goiter 7/12 w/ Dr Pollyann Kennedyosen   . Obesity 08/06/2016  . Osteoporosis 08/06/2016  . Vitamin D deficiency 08/06/2016    Past Surgical History:  Procedure Laterality Date  . ABDOMINAL HYSTERECTOMY  1997  . APPENDECTOMY    . MASS EXCISION  03/02/2011   Procedure: EXCISION MASS;  Surgeon: Nicki ReaperGary R Kuzma, MD;  Location: [facility];  Service: Orthopedics;  Laterality: Right;  Excision Cyst Interphangeal Right Thumb  . OOPHORECTOMY    . SHOULDER ARTHROSCOPY W/ ROTATOR CUFF REPAIR  2007   rt  . TONSILLECTOMY     . TRIGGER FINGER RELEASE  11/01/2011   Procedure: RELEASE TRIGGER FINGER/A-1 PULLEY;  Surgeon: Nicki ReaperGary R Kuzma, MD;  Location:  SURGERY CENTER;  Service: Orthopedics;  Laterality: Right;  right thumb, possible excision cyst    Family History  Problem Relation Age of Onset  . Hypertension Mother   . Diabetes Mother   . Breast cancer Mother 275  . Other Mother   . Hypertension Father   . Cancer Father        throat  . Alcohol abuse Father   . Hypertension Brother   . Heart disease Brother        heart murmur  . Alcohol abuse Brother   . Cancer Maternal Grandmother   . Heart disease Maternal Grandfather   . Heart disease Paternal Grandmother   . Cancer Maternal Aunt   . Breast cancer Maternal Aunt     Social History   Socioeconomic History  . Marital status: Married    Spouse name: Not on file  . Number of children: Not on file  . Years of education: Not on file  . Highest education level: Not on file  Occupational History  . Occupation: Physiological scientistoffice administrator  Social Needs  . Financial resource strain: Not on file  . Food insecurity    Worry: Not on file    Inability: Not on file  . Transportation needs    Medical: Not on file    Non-medical: Not on file  Tobacco Use  . Smoking status:  Never Smoker  . Smokeless tobacco: Never Used  Substance and Sexual Activity  . Alcohol use: No  . Drug use: No  . Sexual activity: Yes    Partners: Male    Comment: lives with husband, works at [employer], no major dietary   Lifestyle  . Physical activity    Days per week: Not on file    Minutes per session: Not on file  . Stress: Not on file  Relationships  . Social Herbalist on phone: Not on file    Gets together: Not on file    Attends religious service: Not on file    Active member of club or organization: Not on file    Attends meetings of clubs or organizations: Not on file    Relationship status: Not on file  . Intimate partner violence    Fear of  current or ex partner: Not on file    Emotionally abused: Not on file    Physically abused: Not on file    Forced sexual activity: Not on file  Other Topics Concern  . Not on file  Social History Narrative   Married; step daughter   Regular exercise: walk 2 days a week   Caffeine use: 2 cups of coffee daily    Outpatient Medications Prior to Visit  Medication Sig Dispense Refill  . amLODipine (NORVASC) 5 MG tablet TAKE 1 TABLET BY MOUTH EVERY DAY 90 tablet 1  . Ascorbic Acid (VITAMIN C) 1000 MG tablet Take 1,000 mg by mouth daily.      Marland Kitchen aspirin 81 MG EC tablet Take 81 mg by mouth daily.      . calcium carbonate (CALCIUM 500) 1250 MG tablet Take 1 tablet by mouth daily.      . diclofenac (VOLTAREN) 75 MG EC tablet Take 1 tablet (75 mg total) by mouth 2 (two) times daily as needed. 60 tablet 2  . glipiZIDE (GLUCOTROL XL) 5 MG 24 hr tablet TAKE 1 TABLET BY MOUTH TWICE DAILY 180 tablet 1  . levothyroxine (SYNTHROID) 75 MCG tablet TAKE 1 TABLET BY MOUTH EVERY DAY BEFORE BREAKFAST. 90 tablet 1  . lisinopril (ZESTRIL) 2.5 MG tablet TAKE 1 TABLET BY MOUTH EVERY DAY 90 tablet 1  . meloxicam (MOBIC) 15 MG tablet Take 1 tablet (15 mg total) by mouth daily. 30 tablet 2  . Multiple Vitamin (MULTIVITAMIN) tablet Take 1 tablet by mouth daily.      . Omega-3 Fatty Acids (FISH OIL PO) Take by mouth daily.      Marland Kitchen triamcinolone cream (KENALOG) 0.1 % Apply 1 application topically 2 (two) times daily. 80 g 2   No facility-administered medications prior to visit.     Allergies  Allergen Reactions  . Codeine   . Tramadol     Review of Systems  Constitutional: Negative for fever and malaise/fatigue.  HENT: Negative for congestion.   Eyes: Negative for blurred vision.  Respiratory: Negative for shortness of breath.   Cardiovascular: Negative for chest pain, palpitations and leg swelling.  Gastrointestinal: Negative for abdominal pain, blood in stool and nausea.  Genitourinary: Negative for dysuria  and frequency.  Musculoskeletal: Positive for joint pain. Negative for falls.  Skin: Negative for rash.  Neurological: Positive for dizziness. Negative for loss of consciousness and headaches.  Endo/Heme/Allergies: Negative for environmental allergies.  Psychiatric/Behavioral: Negative for depression. The patient is not nervous/anxious.        Objective:    Physical Exam Constitutional:  Appearance: Normal appearance. She is not ill-appearing.  HENT:     Head: Normocephalic and atraumatic.     Nose: Nose normal.  Eyes:     General:        Right eye: No discharge.        Left eye: No discharge.  Pulmonary:     Effort: Pulmonary effort is normal.  Neurological:     Mental Status: She is alert and oriented to person, place, and time.  Psychiatric:        Mood and Affect: Mood normal.        Behavior: Behavior normal.     Pulse 75  Wt Readings from Last 3 Encounters:  02/24/18 206 lb 3.2 oz (93.5 kg)  12/15/17 207 lb 9.6 oz (94.2 kg)  08/22/17 209 lb 12.8 oz (95.2 kg)    Diabetic Foot Exam - Simple   No data filed     Lab Results  Component Value Date   WBC 7.5 08/29/2018   HGB 14.3 08/29/2018   HCT 42.5 08/29/2018   PLT 250.0 08/29/2018   GLUCOSE 141 (H) 08/29/2018   CHOL 193 08/29/2018   TRIG 114.0 08/29/2018   HDL 57.30 08/29/2018   LDLCALC 113 (H) 08/29/2018   ALT 19 08/29/2018   AST 15 08/29/2018   NA 138 08/29/2018   K 4.3 08/29/2018   CL 101 08/29/2018   CREATININE 0.66 08/29/2018   BUN 11 08/29/2018   CO2 28 08/29/2018   TSH 1.99 08/29/2018   HGBA1C 7.4 (H) 08/29/2018   MICROALBUR <0.7 08/22/2017    Lab Results  Component Value Date   TSH 1.99 08/29/2018   Lab Results  Component Value Date   WBC 7.5 08/29/2018   HGB 14.3 08/29/2018   HCT 42.5 08/29/2018   MCV 86.9 08/29/2018   PLT 250.0 08/29/2018   Lab Results  Component Value Date   NA 138 08/29/2018   K 4.3 08/29/2018   CO2 28 08/29/2018   GLUCOSE 141 (H) 08/29/2018   BUN  11 08/29/2018   CREATININE 0.66 08/29/2018   BILITOT 0.6 08/29/2018   ALKPHOS 53 08/29/2018   AST 15 08/29/2018   ALT 19 08/29/2018   PROT 6.7 08/29/2018   ALBUMIN 4.7 08/29/2018   CALCIUM 9.1 08/29/2018   GFR 90.00 08/29/2018   Lab Results  Component Value Date   CHOL 193 08/29/2018   Lab Results  Component Value Date   HDL 57.30 08/29/2018   Lab Results  Component Value Date   LDLCALC 113 (H) 08/29/2018   Lab Results  Component Value Date   TRIG 114.0 08/29/2018   Lab Results  Component Value Date   CHOLHDL 3 08/29/2018   Lab Results  Component Value Date   HGBA1C 7.4 (H) 08/29/2018       Assessment & Plan:   Problem List Items Addressed This Visit    Essential hypertension - Primary    Encouraged to check vitals weekly, no changes to meds. Encouraged heart healthy diet such as the DASH diet and exercise as tolerated.       Relevant Orders   CBC   Comprehensive metabolic panel   TSH   Hypothyroidism    On Levothyroxine, continue to monitor      DM type 2 (diabetes mellitus, type 2) (HCC)   Relevant Orders   Hemoglobin A1c   Hyperlipidemia with target LDL less than 100    Encouraged heart healthy diet, increase exercise, avoid trans fats, consider a krill  oil cap daily      Relevant Orders   Lipid panel   Osteoporosis, post-menopausal    Is due for Prolia shot will start prior auth process      Vitamin D deficiency    Supplement and monitor      Relevant Orders   VITAMIN D 25 Hydroxy (Vit-D Deficiency, Fractures)   Vertigo    Mild and infrequent but responds well to Meclizine given refill. Reminded to hydrate well and to not skip meals         I am having [name] [name] start on meclizine. I am also having her maintain her aspirin, calcium carbonate, Omega-3 Fatty Acids (FISH OIL PO), multivitamin, vitamin C, triamcinolone cream, meloxicam, diclofenac, glipiZIDE, lisinopril, amLODipine, and levothyroxine.  Meds ordered this encounter   Medications  . meclizine (ANTIVERT) 25 MG tablet    Sig: Take 1 tablet (25 mg total) by mouth 3 (three) times daily as needed for dizziness.    Dispense:  30 tablet    Refill:  1      I discussed the assessment and treatment plan with the patient. The patient was provided an opportunity to ask questions and all were answered. The patient agreed with the plan and demonstrated an understanding of the instructions.   The patient was advised to call back or seek an in-person evaluation if the symptoms worsen or if the condition fails to improve as anticipated.  I provided 25 minutes of non-face-to-face time during this encounter.   [name] [name] , MD

## 2018-09-03 NOTE — Assessment & Plan Note (Signed)
Is due for Prolia shot will start prior auth process

## 2018-09-03 NOTE — Assessment & Plan Note (Signed)
Mild and infrequent but responds well to Meclizine given refill. Reminded to hydrate well and to not skip meals

## 2018-09-04 DIAGNOSIS — M23222 Derangement of posterior horn of medial meniscus due to old tear or injury, left knee: Secondary | ICD-10-CM | POA: Diagnosis not present

## 2018-09-04 DIAGNOSIS — M94262 Chondromalacia, left knee: Secondary | ICD-10-CM | POA: Diagnosis not present

## 2018-09-13 ENCOUNTER — Ambulatory Visit: Payer: BLUE CROSS/BLUE SHIELD

## 2018-10-25 ENCOUNTER — Other Ambulatory Visit: Payer: Self-pay

## 2018-10-25 ENCOUNTER — Ambulatory Visit
Admission: RE | Admit: 2018-10-25 | Discharge: 2018-10-25 | Disposition: A | Discharge status: Home / Self Care | Payer: BC Managed Care – PPO | Source: Ambulatory Visit | Attending: Family Medicine | Admitting: Family Medicine

## 2018-10-25 DIAGNOSIS — Z1231 Encounter for screening mammogram for malignant neoplasm of breast: Secondary | ICD-10-CM | POA: Diagnosis not present

## 2018-11-03 ENCOUNTER — Telehealth: Payer: Self-pay

## 2018-11-03 ENCOUNTER — Other Ambulatory Visit: Payer: Self-pay

## 2018-11-03 MED ORDER — DENOSUMAB 60 MG/ML ~~LOC~~ SOSY: 60.0000 mg | PREFILLED_SYRINGE | Freq: Once | SUBCUTANEOUS | 0 refills | Status: AC

## 2018-11-03 NOTE — Telephone Encounter (Signed)
PA initiated via Covermymeds; KEY: [id number]. Awaiting determination.

## 2018-11-06 NOTE — Telephone Encounter (Signed)
PA cancelled by insurance.

## 2018-11-07 ENCOUNTER — Telehealth: Payer: Self-pay | Admitting: Family Medicine

## 2018-11-07 NOTE — Telephone Encounter (Signed)
Pharmacy called and is requesting to have pts prolia injection refilled. Please advise.    [facility] - [address], [place] [address]  Phone: [phone] Fax: [phone]  Not a 24 hour pharmacy; exact hours not known.

## 2018-11-08 ENCOUNTER — Other Ambulatory Visit: Payer: Self-pay

## 2018-11-08 MED ORDER — DENOSUMAB 60 MG/ML ~~LOC~~ SOSY: 60.0000 mg | PREFILLED_SYRINGE | Freq: Once | SUBCUTANEOUS | 0 refills | Status: AC

## 2018-11-08 NOTE — Telephone Encounter (Signed)
Completed.

## 2018-11-22 ENCOUNTER — Telehealth: Payer: Self-pay

## 2018-11-22 NOTE — Telephone Encounter (Signed)
Spoke with [facility], gave them ICD code. Advised me to give it 24 hours for processing. They are going to reach out to the patient for copay amount and billing then will call me to schedule delivery to our office.

## 2018-11-22 NOTE — Telephone Encounter (Signed)
Copied from [place] [phone]. Topic: General - Inquiry >> Nov 22, 2018 10:59 AM Mathis Bud wrote: Reason for CRM: Vaughan Basta from [facility] is calling regarding medications, she is calling to get IC 10 code.  Call back [phone]

## 2018-11-28 ENCOUNTER — Encounter: Payer: Self-pay | Admitting: Family Medicine

## 2018-11-29 NOTE — Telephone Encounter (Signed)
Taking care of this message. Meeting with prolia reps on Friday virtually to discuss.

## 2018-12-10 DIAGNOSIS — M810 Age-related osteoporosis without current pathological fracture: Secondary | ICD-10-CM | POA: Diagnosis not present

## 2018-12-13 ENCOUNTER — Other Ambulatory Visit: Payer: Self-pay

## 2018-12-13 ENCOUNTER — Ambulatory Visit (INDEPENDENT_AMBULATORY_CARE_PROVIDER_SITE_OTHER): Payer: BC Managed Care – PPO | Admitting: *Deleted

## 2018-12-13 DIAGNOSIS — M810 Age-related osteoporosis without current pathological fracture: Secondary | ICD-10-CM

## 2018-12-13 MED ORDER — DENOSUMAB 60 MG/ML ~~LOC~~ SOSY
60.0000 mg | PREFILLED_SYRINGE | Freq: Once | SUBCUTANEOUS | Status: AC
  Administered 2018-12-13: 60 mg via SUBCUTANEOUS

## 2018-12-13 NOTE — Progress Notes (Signed)
Patient here for prolia injection per Dr. Charlett Blake.  Prolia injection given in left arm and patient tolerated well.

## 2019-02-14 ENCOUNTER — Other Ambulatory Visit: Payer: Self-pay | Admitting: Family Medicine

## 2019-02-14 DIAGNOSIS — I10 Essential (primary) hypertension: Secondary | ICD-10-CM

## 2019-03-08 ENCOUNTER — Encounter: Payer: Self-pay | Admitting: Medical

## 2019-03-08 ENCOUNTER — Encounter: Payer: Self-pay | Admitting: Family Medicine

## 2019-03-08 ENCOUNTER — Ambulatory Visit (INDEPENDENT_AMBULATORY_CARE_PROVIDER_SITE_OTHER): Payer: Medicare Other | Admitting: Medical

## 2019-03-08 ENCOUNTER — Other Ambulatory Visit: Payer: Self-pay

## 2019-03-08 VITALS — BP 150/90 | HR 105 | Temp 97.3°F | Resp 18 | Ht 67.5 in | Wt 211.6 lb

## 2019-03-08 DIAGNOSIS — R81 Glycosuria: Secondary | ICD-10-CM | POA: Diagnosis not present

## 2019-03-08 DIAGNOSIS — R350 Frequency of micturition: Secondary | ICD-10-CM

## 2019-03-08 DIAGNOSIS — R739 Hyperglycemia, unspecified: Secondary | ICD-10-CM | POA: Diagnosis not present

## 2019-03-08 LAB — POCT URINALYSIS DIPSTICK
Bilirubin, UA: NEGATIVE
Blood, UA: NEGATIVE
Glucose, UA: POSITIVE — AB
Ketones, UA: NEGATIVE
Leukocytes, UA: NEGATIVE
Nitrite, UA: NEGATIVE
Protein, UA: NEGATIVE
Spec Grav, UA: <=1.005 — AB (ref 1.010–1.025)
Urobilinogen, UA: 0.2 E.U./dL
pH, UA: 6 (ref 5.0–8.0)

## 2019-03-08 MED ORDER — PHENAZOPYRIDINE HCL 200 MG PO TABS: 200.0000 mg | ORAL_TABLET | Freq: Three times a day (TID) | ORAL | 0 refills | Status: DC | PRN

## 2019-03-08 MED ORDER — NITROFURANTOIN MONOHYD MACRO 100 MG PO CAPS: 100.0000 mg | ORAL_CAPSULE | Freq: Two times a day (BID) | ORAL | 0 refills | Status: DC

## 2019-03-08 NOTE — Patient Instructions (Signed)
You appear to have a urinary tract infection. I am prescribing macroibid antibiotic for the probable infection. Hydrate well. I am sending out a urine culture. During the interim if your signs and symptoms worsen rather than improving please notify us. We will notify your when the culture results are back.  Pyridium for urinary pain.  For sugar in urine and elevated sugar in past recommend getting scheduled for fasting labs that are in computer designated future.  Follow up in 7 days or as needed.

## 2019-03-08 NOTE — Progress Notes (Signed)
   Subjective:    Patient ID: GAE BIHL, female    DOB: [birth year], 64 y.o.   MRN: [id number]  HPI  Pt is in for urinary frequent urination for one day. Some bladder pressure.  Pt in today reporting urinary symptoms for one day.  Dysuria- yes.mild and early. Frequent urination-yes Hesitancy-no Suprapubic pressure-yes Fever-no chills-no Nausea-no Vomiting-no CVA pain-no History of UTI-1-2 at most. Years ago. Gross hematuria-no  No odor to urine.   Review of Systems     Objective:   Physical Exam  General- No acute distress. Pleasant patient. Neck- Full range of motion, no jvd Lungs- Clear, even and unlabored. Heart- regular rate and rhythm. Neurologic- CNII- XII grossly intact.       Assessment & Plan:  You appear to have a urinary tract infection. I am prescribing macroibid antibiotic for the probable infection. Hydrate well. I am sending out a urine culture. During the interim if your signs and symptoms worsen rather than improving please notify us. We will notify your when the culture results are back.  Pyridium for urinary pain.  For sugar in urine and elevated sugar in past recommend getting scheduled for fasting labs that are in computer designated future.  Follow up in 7 days or as needed.

## 2019-03-09 LAB — URINE CULTURE
MICRO NUMBER:: 10066143
SPECIMEN QUALITY:: ADEQUATE

## 2019-03-27 ENCOUNTER — Ambulatory Visit: Payer: Medicare Other | Attending: Internal Medicine

## 2019-03-27 DIAGNOSIS — Z23 Encounter for immunization: Secondary | ICD-10-CM | POA: Insufficient documentation

## 2019-03-27 NOTE — Progress Notes (Signed)
   Covid-19 Vaccination Clinic  Name:  [name] [name]    MRN: [id number] DOB: 03-10-1955  03/27/2019  [name] was observed post Covid-19 immunization for 15 minutes without incidence. She was provided with Vaccine Information Sheet and instruction to access the V-Safe system.   [name] was instructed to call 911 with any severe reactions post vaccine: Marland Kitchen Difficulty breathing  . Swelling of your face and throat  . A fast heartbeat  . A bad rash all over your body  . Dizziness and weakness    Immunizations Administered    Name Date Dose VIS Date Route   Pfizer COVID-19 Vaccine 03/27/2019  1:07 PM 0.3 mL 01/26/2019 Intramuscular   Manufacturer: [employer], [employer]   Lot: [id number]   NDC: [id number]

## 2019-03-29 ENCOUNTER — Ambulatory Visit: Payer: Self-pay

## 2019-04-21 ENCOUNTER — Ambulatory Visit: Payer: Medicare Other | Attending: Internal Medicine

## 2019-04-21 DIAGNOSIS — Z23 Encounter for immunization: Secondary | ICD-10-CM | POA: Insufficient documentation

## 2019-04-21 NOTE — Progress Notes (Signed)
   Covid-19 Vaccination Clinic  Name:  RHETT NAJERA    MRN: [id number] DOB: Jan 20, 1956  04/21/2019  Ms. Radovich was observed post Covid-19 immunization for 15 minutes without incident. She was provided with Vaccine Information Sheet and instruction to access the V-Safe system.   Ms. Fodera was instructed to call 911 with any severe reactions post vaccine: Marland Kitchen Difficulty breathing  . Swelling of face and throat  . A fast heartbeat  . A bad rash all over body  . Dizziness and weakness   Immunizations Administered    Name Date Dose VIS Date Route   Pfizer COVID-19 Vaccine 04/21/2019  9:01 AM 0.3 mL 01/26/2019 Intramuscular   Manufacturer: [employer], [employer]   Lot: [id number]   NDC: [address]-9

## 2019-04-26 LAB — HM DIABETES EYE EXAM

## 2019-06-05 ENCOUNTER — Other Ambulatory Visit: Payer: Self-pay | Admitting: *Deleted

## 2019-06-05 MED ORDER — DENOSUMAB 60 MG/ML ~~LOC~~ SOSY: 60.0000 mg | PREFILLED_SYRINGE | Freq: Once | SUBCUTANEOUS | 0 refills | Status: AC

## 2019-06-25 ENCOUNTER — Encounter: Payer: Self-pay | Admitting: Family Medicine

## 2019-06-25 LAB — HM COLONOSCOPY

## 2019-07-10 ENCOUNTER — Encounter: Payer: Self-pay | Admitting: *Deleted

## 2019-07-20 ENCOUNTER — Other Ambulatory Visit: Payer: Self-pay | Admitting: Family Medicine

## 2019-07-20 DIAGNOSIS — I10 Essential (primary) hypertension: Secondary | ICD-10-CM

## 2019-10-08 ENCOUNTER — Other Ambulatory Visit: Payer: Self-pay | Admitting: Family Medicine

## 2019-10-08 DIAGNOSIS — I10 Essential (primary) hypertension: Secondary | ICD-10-CM

## 2019-10-26 ENCOUNTER — Telehealth (INDEPENDENT_AMBULATORY_CARE_PROVIDER_SITE_OTHER): Payer: Medicare Other | Admitting: Family Medicine

## 2019-10-26 ENCOUNTER — Other Ambulatory Visit: Payer: Self-pay

## 2019-10-26 ENCOUNTER — Other Ambulatory Visit: Payer: Self-pay | Admitting: Family Medicine

## 2019-10-26 VITALS — BP 145/89 | HR 79

## 2019-10-26 DIAGNOSIS — Z1231 Encounter for screening mammogram for malignant neoplasm of breast: Secondary | ICD-10-CM

## 2019-10-26 DIAGNOSIS — E785 Hyperlipidemia, unspecified: Secondary | ICD-10-CM

## 2019-10-26 DIAGNOSIS — M810 Age-related osteoporosis without current pathological fracture: Secondary | ICD-10-CM

## 2019-10-26 DIAGNOSIS — I10 Essential (primary) hypertension: Secondary | ICD-10-CM | POA: Diagnosis not present

## 2019-10-26 DIAGNOSIS — E038 Other specified hypothyroidism: Secondary | ICD-10-CM

## 2019-10-26 DIAGNOSIS — E119 Type 2 diabetes mellitus without complications: Secondary | ICD-10-CM

## 2019-10-26 DIAGNOSIS — E559 Vitamin D deficiency, unspecified: Secondary | ICD-10-CM

## 2019-10-26 MED ORDER — LEVOTHYROXINE SODIUM 75 MCG PO TABS: ORAL_TABLET | ORAL | 0 refills | Status: DC

## 2019-10-26 MED ORDER — AMLODIPINE BESYLATE 5 MG PO TABS: 5.0000 mg | ORAL_TABLET | Freq: Every day | ORAL | 0 refills | Status: DC

## 2019-10-26 MED ORDER — LISINOPRIL 2.5 MG PO TABS: 2.5000 mg | ORAL_TABLET | Freq: Every day | ORAL | 1 refills | Status: DC

## 2019-10-26 MED ORDER — GLIPIZIDE ER 5 MG PO TB24: 5.0000 mg | ORAL_TABLET | Freq: Two times a day (BID) | ORAL | 0 refills | Status: DC

## 2019-10-29 ENCOUNTER — Other Ambulatory Visit (INDEPENDENT_AMBULATORY_CARE_PROVIDER_SITE_OTHER): Payer: Medicare Other

## 2019-10-29 ENCOUNTER — Other Ambulatory Visit: Payer: Self-pay

## 2019-10-29 DIAGNOSIS — E119 Type 2 diabetes mellitus without complications: Secondary | ICD-10-CM

## 2019-10-29 DIAGNOSIS — E785 Hyperlipidemia, unspecified: Secondary | ICD-10-CM

## 2019-10-29 DIAGNOSIS — E559 Vitamin D deficiency, unspecified: Secondary | ICD-10-CM

## 2019-10-29 DIAGNOSIS — E038 Other specified hypothyroidism: Secondary | ICD-10-CM

## 2019-10-29 DIAGNOSIS — I10 Essential (primary) hypertension: Secondary | ICD-10-CM

## 2019-10-29 NOTE — Progress Notes (Signed)
Virtual Visit via Video Note  I connected with [name] [name] on 10/26/19 at  8:00 AM EDT by a video enabled telemedicine application and verified that I am speaking with the correct person using two identifiers.  Location: Patient: home, patient and provider are present in visit Provider: home   I discussed the limitations of evaluation and management by telemedicine and the availability of in person appointments. The patient expressed understanding and agreed to proceed. Thelma Barge, CMA was able to set the patient up on a video visit.      Subjective:    Patient ID: [name] [name], female    DOB: 1955/06/14, 64 y.o.   MRN: [id number]  Chief Complaint  Patient presents with   Medication Refill    HPI Patient is in today for follow up on chronic medical concerns. No recent febrile illness or hospitalizations. She is doing well. She has been maintaining a heart healthy diet and staying active. She has maintained quarantine as able. She tolerated COVID vaccines. Denies CP/palp/SOB/HA/congestion/fevers/GI or GU c/o. Taking meds as prescribed  Past Medical History:  Diagnosis Date   Back pain 06/13/2015   Diabetes mellitus    started Metformin 12/2012   History of shingles 2012   back   Hyperlipidemia    Hypertension    Hypothyroidism 07/29/2010   S/p FNA showing non-neoplastic goiter 7/12 w/ Dr Pollyann Kennedy    Obesity 08/06/2016   Osteoporosis 08/06/2016   Vitamin D deficiency 08/06/2016    Past Surgical History:  Procedure Laterality Date   ABDOMINAL HYSTERECTOMY  1997   APPENDECTOMY     MASS EXCISION  03/02/2011   Procedure: EXCISION MASS;  Surgeon: Nicki Reaper, MD;  Location: [facility];  Service: Orthopedics;  Laterality: Right;  Excision Cyst Interphangeal Right Thumb   OOPHORECTOMY     SHOULDER ARTHROSCOPY W/ ROTATOR CUFF REPAIR  2007   rt   TONSILLECTOMY     TRIGGER FINGER RELEASE  11/01/2011   Procedure: RELEASE TRIGGER  FINGER/A-1 PULLEY;  Surgeon: Nicki Reaper, MD;  Location: [facility];  Service: Orthopedics;  Laterality: Right;  right thumb, possible excision cyst    Family History  Problem Relation Age of Onset   Hypertension Mother    Diabetes Mother    Breast cancer Mother 53   Other Mother    Hypertension Father    Cancer Father        throat   Alcohol abuse Father    Hypertension Brother    Heart disease Brother        heart murmur   Alcohol abuse Brother    Cancer Maternal Grandmother    Heart disease Maternal Grandfather    Heart disease Paternal Grandmother    Cancer Maternal Aunt    Breast cancer Maternal Aunt     Social History   Socioeconomic History   Marital status: Married    Spouse name: Not on file   Number of children: Not on file   Years of education: Not on file   Highest education level: Not on file  Occupational History   Occupation: Physiological scientist  Tobacco Use   Smoking status: Never Smoker   Smokeless tobacco: Never Used  Substance and Sexual Activity   Alcohol use: No   Drug use: No   Sexual activity: Yes    Partners: Male    Comment: lives with husband, works at [employer], no major dietary   Other Topics Concern   Not  on file  Social History Narrative   Married; step daughter   Regular exercise: walk 2 days a week   Caffeine use: 2 cups of coffee daily   Social Determinants of Health   Financial Resource Strain:    Difficulty of Paying Living Expenses: Not on file  Food Insecurity:    Worried About Programme researcher, broadcasting/film/video in the Last Year: Not on file   [employer] of Food in the Last Year: Not on file  Transportation Needs:    Lack of Transportation (Medical): Not on file   Lack of Transportation (Non-Medical): Not on file  Physical Activity:    Days of Exercise per Week: Not on file   Minutes of Exercise per Session: Not on file  Stress:    Feeling of Stress : Not on file  Social  Connections:    Frequency of Communication with Friends and Family: Not on file   Frequency of Social Gatherings with Friends and Family: Not on file   Attends Religious Services: Not on file   Active Member of Clubs or Organizations: Not on file   Attends Banker Meetings: Not on file   Marital Status: Not on file  Intimate Partner Violence:    Fear of Current or Ex-Partner: Not on file   Emotionally Abused: Not on file   Physically Abused: Not on file   Sexually Abused: Not on file    Outpatient Medications Prior to Visit  Medication Sig Dispense Refill   Ascorbic Acid (VITAMIN C) 1000 MG tablet Take 1,000 mg by mouth daily.       aspirin 81 MG EC tablet Take 81 mg by mouth daily.       calcium carbonate (CALCIUM 500) 1250 MG tablet Take 1 tablet by mouth daily.       diclofenac (VOLTAREN) 75 MG EC tablet Take 1 tablet (75 mg total) by mouth 2 (two) times daily as needed. 60 tablet 2   meclizine (ANTIVERT) 25 MG tablet Take 1 tablet (25 mg total) by mouth 3 (three) times daily as needed for dizziness. 30 tablet 1   meloxicam (MOBIC) 15 MG tablet Take 1 tablet (15 mg total) by mouth daily. 30 tablet 2   Multiple Vitamin (MULTIVITAMIN) tablet Take 1 tablet by mouth daily.       nitrofurantoin, macrocrystal-monohydrate, (MACROBID) 100 MG capsule Take 1 capsule (100 mg total) by mouth 2 (two) times daily. 14 capsule 0   Omega-3 Fatty Acids (FISH OIL PO) Take by mouth daily.       phenazopyridine (PYRIDIUM) 200 MG tablet Take 1 tablet (200 mg total) by mouth 3 (three) times daily as needed for pain. 10 tablet 0   triamcinolone cream (KENALOG) 0.1 % Apply 1 application topically 2 (two) times daily. 80 g 2   amLODipine (NORVASC) 5 MG tablet TAKE 1 TABLET BY MOUTH EVERY DAY 90 tablet 0   glipiZIDE (GLUCOTROL XL) 5 MG 24 hr tablet TAKE 1 TABLET BY MOUTH TWICE DAILY 180 tablet 0   levothyroxine (SYNTHROID) 75 MCG tablet TAKE 1 TABLET BY MOUTH EVERY DAY  BEFORE BREAKFAST. 90 tablet 0   lisinopril (ZESTRIL) 2.5 MG tablet TAKE 1 TABLET BY MOUTH EVERY DAY 90 tablet 1   No facility-administered medications prior to visit.    Allergies  Allergen Reactions   Codeine    Tramadol     Review of Systems  Constitutional: Negative for fever and malaise/fatigue.  HENT: Negative for congestion.   Eyes: Negative for  blurred vision.  Respiratory: Negative for shortness of breath.   Cardiovascular: Negative for chest pain, palpitations and leg swelling.  Gastrointestinal: Negative for abdominal pain, blood in stool and nausea.  Genitourinary: Negative for dysuria and frequency.  Musculoskeletal: Negative for falls.  Skin: Negative for rash.  Neurological: Negative for dizziness, loss of consciousness and headaches.  Endo/Heme/Allergies: Negative for environmental allergies.  Psychiatric/Behavioral: Negative for depression. The patient is not nervous/anxious.        Objective:    Physical Exam Constitutional:      Appearance: Normal appearance. She is not ill-appearing.  HENT:     Head: Normocephalic and atraumatic.     Right Ear: External ear normal.     Left Ear: External ear normal.     Nose: Nose normal.  Eyes:     General:        Right eye: No discharge.        Left eye: No discharge.  Pulmonary:     Effort: Pulmonary effort is normal.  Neurological:     Mental Status: She is alert and oriented to person, place, and time.  Psychiatric:        Behavior: Behavior normal.     BP (!) 145/89 (BP Location: Left Arm, Patient Position: Sitting, Cuff Size: Normal)    Pulse 79    SpO2 98%  Wt Readings from Last 3 Encounters:  03/08/19 211 lb 9.6 oz (96 kg)  02/24/18 206 lb 3.2 oz (93.5 kg)  12/15/17 207 lb 9.6 oz (94.2 kg)    Diabetic Foot Exam - Simple   No data filed     Lab Results  Component Value Date   WBC 7.5 08/29/2018   HGB 14.3 08/29/2018   HCT 42.5 08/29/2018   PLT 250.0 08/29/2018   GLUCOSE 141 (H)  08/29/2018   CHOL 193 08/29/2018   TRIG 114.0 08/29/2018   HDL 57.30 08/29/2018   LDLCALC 113 (H) 08/29/2018   ALT 19 08/29/2018   AST 15 08/29/2018   NA 138 08/29/2018   K 4.3 08/29/2018   CL 101 08/29/2018   CREATININE 0.66 08/29/2018   BUN 11 08/29/2018   CO2 28 08/29/2018   TSH 1.99 08/29/2018   HGBA1C 7.4 (H) 08/29/2018   MICROALBUR <0.7 08/22/2017    Lab Results  Component Value Date   TSH 1.99 08/29/2018   Lab Results  Component Value Date   WBC 7.5 08/29/2018   HGB 14.3 08/29/2018   HCT 42.5 08/29/2018   MCV 86.9 08/29/2018   PLT 250.0 08/29/2018   Lab Results  Component Value Date   NA 138 08/29/2018   K 4.3 08/29/2018   CO2 28 08/29/2018   GLUCOSE 141 (H) 08/29/2018   BUN 11 08/29/2018   CREATININE 0.66 08/29/2018   BILITOT 0.6 08/29/2018   ALKPHOS 53 08/29/2018   AST 15 08/29/2018   ALT 19 08/29/2018   PROT 6.7 08/29/2018   ALBUMIN 4.7 08/29/2018   CALCIUM 9.1 08/29/2018   GFR 90.00 08/29/2018   Lab Results  Component Value Date   CHOL 193 08/29/2018   Lab Results  Component Value Date   HDL 57.30 08/29/2018   Lab Results  Component Value Date   LDLCALC 113 (H) 08/29/2018   Lab Results  Component Value Date   TRIG 114.0 08/29/2018   Lab Results  Component Value Date   CHOLHDL 3 08/29/2018   Lab Results  Component Value Date   HGBA1C 7.4 (H) 08/29/2018  Assessment & Plan:   Problem List Items Addressed This Visit    Essential hypertension    Monitor and report any concerns, no changes to meds. Encouraged heart healthy diet such as the DASH diet and exercise as tolerated.       Relevant Medications   amLODipine (NORVASC) 5 MG tablet   lisinopril (ZESTRIL) 2.5 MG tablet   Other Relevant Orders   CBC   Comprehensive metabolic panel   Hypothyroidism    On Levothyroxine, continue to monitor      Relevant Medications   levothyroxine (SYNTHROID) 75 MCG tablet   Other Relevant Orders   TSH   DM type 2 (diabetes  mellitus, type 2) (HCC)   Relevant Medications   glipiZIDE (GLUCOTROL XL) 5 MG 24 hr tablet   lisinopril (ZESTRIL) 2.5 MG tablet   Other Relevant Orders   Hemoglobin A1c   TSH   Hyperlipidemia with target LDL less than 100    Encouraged heart healthy diet, increase exercise, avoid trans fats, consider a krill oil cap daily      Relevant Medications   amLODipine (NORVASC) 5 MG tablet   lisinopril (ZESTRIL) 2.5 MG tablet   Other Relevant Orders   Lipid panel   Osteoporosis, post-menopausal    Encouraged to get adequate exercise, calcium and vitamin d intake      Vitamin D deficiency - Primary    Supplement and monitor      Relevant Orders   VITAMIN D 25 Hydroxy (Vit-D Deficiency, Fractures)      I have changed [name] [name]'s amLODipine, glipiZIDE, and lisinopril. I am also having her maintain her aspirin, calcium carbonate, Omega-3 Fatty Acids (FISH OIL PO), multivitamin, vitamin C, triamcinolone cream, meloxicam, diclofenac, meclizine, nitrofurantoin (macrocrystal-monohydrate), phenazopyridine, and levothyroxine.  Meds ordered this encounter  Medications   amLODipine (NORVASC) 5 MG tablet    Sig: Take 1 tablet (5 mg total) by mouth daily.    Dispense:  90 tablet    Refill:  0    Need ov before next refill   glipiZIDE (GLUCOTROL XL) 5 MG 24 hr tablet    Sig: Take 1 tablet (5 mg total) by mouth 2 (two) times daily.    Dispense:  180 tablet    Refill:  0    Need ov before next refill   levothyroxine (SYNTHROID) 75 MCG tablet    Sig: TAKE 1 TABLET BY MOUTH EVERY DAY BEFORE BREAKFAST.    Dispense:  90 tablet    Refill:  0    Need ov before next refill   lisinopril (ZESTRIL) 2.5 MG tablet    Sig: Take 1 tablet (2.5 mg total) by mouth daily.    Dispense:  90 tablet    Refill:  1     I discussed the assessment and treatment plan with the patient. The patient was provided an opportunity to ask questions and all were answered. The patient agreed with the plan and  demonstrated an understanding of the instructions.   The patient was advised to call back or seek an in-person evaluation if the symptoms worsen or if the condition fails to improve as anticipated.  I provided 20 minutes of non-face-to-face time during this encounter.   [name] EdgeStacey Tenleigh Byer, MD

## 2019-10-29 NOTE — Assessment & Plan Note (Signed)
On Levothyroxine, continue to monitor 

## 2019-10-29 NOTE — Assessment & Plan Note (Signed)
Supplement and monitor 

## 2019-10-29 NOTE — Assessment & Plan Note (Signed)
Encouraged heart healthy diet, increase exercise, avoid trans fats, consider a krill oil cap daily 

## 2019-10-29 NOTE — Assessment & Plan Note (Signed)
Monitor and report any concerns, no changes to meds. Encouraged heart healthy diet such as the DASH diet and exercise as tolerated.  ?

## 2019-10-29 NOTE — Assessment & Plan Note (Signed)
Encouraged to get adequate exercise, calcium and vitamin d intake 

## 2019-10-31 ENCOUNTER — Other Ambulatory Visit: Payer: Self-pay | Admitting: *Deleted

## 2019-10-31 LAB — LIPID PANEL
Cholesterol: 178 mg/dL (ref <200)
HDL: 62 mg/dL (ref >=50)
LDL Cholesterol (Calc): 95 mg/dL (calc)
Non-HDL Cholesterol (Calc): 116 mg/dL (calc) (ref <130)
Total CHOL/HDL Ratio: 2.9 (calc) (ref <5.0)
Triglycerides: 112 mg/dL (ref <150)

## 2019-10-31 LAB — COMPREHENSIVE METABOLIC PANEL
AG Ratio: 2.1 (calc) (ref 1.0–2.5)
ALT: 16 U/L (ref 6–29)
AST: 15 U/L (ref 10–35)
Albumin: 4.4 g/dL (ref 3.6–5.1)
Alkaline phosphatase (APISO): 85 U/L (ref 37–153)
BUN: 17 mg/dL (ref 7–25)
CO2: 27 mmol/L (ref 20–32)
Calcium: 9.4 mg/dL (ref 8.6–10.4)
Chloride: 102 mmol/L (ref 98–110)
Creat: 0.71 mg/dL (ref 0.50–0.99)
Globulin: 2.1 g/dL (calc) (ref 1.9–3.7)
Glucose, Bld: 175 mg/dL — ABNORMAL HIGH (ref 65–99)
Potassium: 4.5 mmol/L (ref 3.5–5.3)
Sodium: 139 mmol/L (ref 135–146)
Total Bilirubin: 0.6 mg/dL (ref 0.2–1.2)
Total Protein: 6.5 g/dL (ref 6.1–8.1)

## 2019-10-31 LAB — TSH: TSH: 2.71 mIU/L (ref 0.40–4.50)

## 2019-10-31 LAB — CBC
HCT: 42.8 % (ref 35.0–45.0)
Hemoglobin: 14.3 g/dL (ref 11.7–15.5)
MCH: 29 pg (ref 27.0–33.0)
MCHC: 33.4 g/dL (ref 32.0–36.0)
MCV: 86.8 fL (ref 80.0–100.0)
MPV: 11.3 fL (ref 7.5–12.5)
Platelets: 230 10*3/uL (ref 140–400)
RBC: 4.93 10*6/uL (ref 3.80–5.10)
RDW: 11.8 % (ref 11.0–15.0)
WBC: 8.1 10*3/uL (ref 3.8–10.8)

## 2019-10-31 LAB — HEMOGLOBIN A1C
Hgb A1c MFr Bld: 8 % of total Hgb — ABNORMAL HIGH (ref <5.7)
Mean Plasma Glucose: 183 (calc)
eAG (mmol/L): 10.1 (calc)

## 2019-10-31 LAB — VITAMIN D 25 HYDROXY (VIT D DEFICIENCY, FRACTURES): Vit D, 25-Hydroxy: 28 ng/mL — ABNORMAL LOW (ref 30–100)

## 2019-10-31 MED ORDER — GLIPIZIDE ER 5 MG PO TB24: 5.0000 mg | ORAL_TABLET | Freq: Three times a day (TID) | ORAL | 1 refills | Status: DC

## 2019-11-05 ENCOUNTER — Other Ambulatory Visit: Payer: Self-pay

## 2019-11-05 ENCOUNTER — Ambulatory Visit
Admission: RE | Admit: 2019-11-05 | Discharge: 2019-11-05 | Disposition: A | Discharge status: Home / Self Care | Payer: Medicare Other | Source: Ambulatory Visit | Attending: Family Medicine | Admitting: Family Medicine

## 2019-11-05 DIAGNOSIS — Z1231 Encounter for screening mammogram for malignant neoplasm of breast: Secondary | ICD-10-CM

## 2019-11-30 ENCOUNTER — Telehealth: Payer: Self-pay | Admitting: Family Medicine

## 2019-11-30 NOTE — Telephone Encounter (Signed)
Caller : [employer]BS[employer]andis Cocking [phone], opt 3, extension [phone]  Per [employer] , they are calling requesting to know whether or no patient is on a statin drug.

## 2019-12-01 NOTE — Telephone Encounter (Signed)
Called pharmacy Quality team the option 3, 4 unable to speak with [name] [name] must call back at a later time. Will call again at during the work week.

## 2019-12-03 NOTE — Telephone Encounter (Signed)
Form received from pt insurance and will be completed and faxed back. Currently in providers bin

## 2019-12-28 NOTE — Telephone Encounter (Signed)
FYI-she declined this month ago for same reason. After visit she wanted to try again to see if cost would change. No change. Unfortunately  there is no way to reduce cost for her.

## 2020-01-14 ENCOUNTER — Other Ambulatory Visit: Payer: Self-pay | Admitting: Family Medicine

## 2020-01-14 DIAGNOSIS — I10 Essential (primary) hypertension: Secondary | ICD-10-CM

## 2020-01-29 ENCOUNTER — Other Ambulatory Visit: Payer: Self-pay

## 2020-01-29 ENCOUNTER — Encounter: Payer: Self-pay | Admitting: Family Medicine

## 2020-01-29 ENCOUNTER — Ambulatory Visit (INDEPENDENT_AMBULATORY_CARE_PROVIDER_SITE_OTHER): Payer: Medicare Other | Admitting: Family Medicine

## 2020-01-29 VITALS — BP 120/74 | HR 74 | Temp 98.2°F | Resp 16 | Wt 202.8 lb

## 2020-01-29 DIAGNOSIS — E559 Vitamin D deficiency, unspecified: Secondary | ICD-10-CM | POA: Diagnosis not present

## 2020-01-29 DIAGNOSIS — M810 Age-related osteoporosis without current pathological fracture: Secondary | ICD-10-CM

## 2020-01-29 DIAGNOSIS — Z23 Encounter for immunization: Secondary | ICD-10-CM | POA: Diagnosis not present

## 2020-01-29 DIAGNOSIS — E785 Hyperlipidemia, unspecified: Secondary | ICD-10-CM | POA: Diagnosis not present

## 2020-01-29 DIAGNOSIS — Z86010 Personal history of colonic polyps: Secondary | ICD-10-CM

## 2020-01-29 DIAGNOSIS — Z9889 Other specified postprocedural states: Secondary | ICD-10-CM

## 2020-01-29 DIAGNOSIS — E119 Type 2 diabetes mellitus without complications: Secondary | ICD-10-CM

## 2020-01-29 DIAGNOSIS — I10 Essential (primary) hypertension: Secondary | ICD-10-CM

## 2020-01-29 DIAGNOSIS — E118 Type 2 diabetes mellitus with unspecified complications: Secondary | ICD-10-CM

## 2020-01-29 DIAGNOSIS — Z0000 Encounter for general adult medical examination without abnormal findings: Secondary | ICD-10-CM | POA: Diagnosis not present

## 2020-01-29 NOTE — Patient Instructions (Signed)
Preventive Care 66 Years and Older, Female Preventive care refers to lifestyle choices and visits with your health care provider that can promote health and wellness. This includes:  A yearly physical exam. This is also called an annual well check.  Regular dental and eye exams.  Immunizations.  Screening for certain conditions.  Healthy lifestyle choices, such as diet and exercise. What can I expect for my preventive care visit? Physical exam Your health care provider will check:  Height and weight. These may be used to calculate body mass index (BMI), which is a measurement that tells if you are at a healthy weight.  Heart rate and blood pressure.  Your skin for abnormal spots. Counseling Your health care provider may ask you questions about:  Alcohol, tobacco, and drug use.  Emotional well-being.  Home and relationship well-being.  Sexual activity.  Eating habits.  History of falls.  Memory and ability to understand (cognition).  Work and work Statistician.  Pregnancy and menstrual history. What immunizations do I need?  Influenza (flu) vaccine  This is recommended every year. Tetanus, diphtheria, and pertussis (Tdap) vaccine  You may need a Td booster every 10 years. Varicella (chickenpox) vaccine  You may need this vaccine if you have not already been vaccinated. Zoster (shingles) vaccine  You may need this after age 39. Pneumococcal conjugate (PCV13) vaccine  One dose is recommended after age 73. Pneumococcal polysaccharide (PPSV23) vaccine  One dose is recommended after age 13. Measles, mumps, and rubella (MMR) vaccine  You may need at least one dose of MMR if you were born in 1957 or later. You may also need a second dose. Meningococcal conjugate (MenACWY) vaccine  You may need this if you have certain conditions. Hepatitis A vaccine  You may need this if you have certain conditions or if you travel or work in places where you may be exposed  to hepatitis A. Hepatitis B vaccine  You may need this if you have certain conditions or if you travel or work in places where you may be exposed to hepatitis B. Haemophilus influenzae type b (Hib) vaccine  You may need this if you have certain conditions. You may receive vaccines as individual doses or as more than one vaccine together in one shot (combination vaccines). Talk with your health care provider about the risks and benefits of combination vaccines. What tests do I need? Blood tests  Lipid and cholesterol levels. These may be checked every 5 years, or more frequently depending on your overall health.  Hepatitis C test.  Hepatitis B test. Screening  Lung cancer screening. You may have this screening every year starting at age 66 if you have a 30-pack-year history of smoking and currently smoke or have quit within the past 15 years.  Colorectal cancer screening. All adults should have this screening starting at age 66 and continuing until age 66. Your health care provider may recommend screening at age 66 if you are at increased risk. You will have tests every 1-10 years, depending on your results and the type of screening test.  Diabetes screening. This is done by checking your blood sugar (glucose) after you have not eaten for a while (fasting). You may have this done every 1-3 years.  Mammogram. This may be done every 1-2 years. Talk with your health care provider about how often you should have regular mammograms.  BRCA-related cancer screening. This may be done if you have a family history of breast, ovarian, tubal, or peritoneal cancers.  Other tests °· Sexually transmitted disease (STD) testing. °· Bone density scan. This is done to screen for osteoporosis. You may have this done starting at age 66. °Follow these instructions at home: °Eating and drinking °· Eat a diet that includes fresh fruits and vegetables, whole grains, lean protein, and low-fat dairy products. Limit  your intake of foods with high amounts of sugar, saturated fats, and salt. °· Take vitamin and mineral supplements as recommended by your health care provider. °· Do not drink alcohol if your health care provider tells you not to drink. °· If you drink alcohol: °? Limit how much you have to 0-1 drink a day. °? Be aware of how much alcohol is in your drink. In the [place] one drink equals one 12 oz bottle of beer (355 mL), one 5 oz glass of wine (148 mL), or one 1½ oz glass of hard liquor (44 mL). °Lifestyle °· Take daily care of your teeth and gums. °· Stay active. Exercise for at least 30 minutes on 5 or more days each week. °· Do not use any products that contain nicotine or tobacco, such as cigarettes, e-cigarettes, and chewing tobacco. If you need help quitting, ask your health care provider. °· If you are sexually active, practice safe sex. Use a condom or other form of protection in order to prevent STIs (sexually transmitted infections). °· Talk with your health care provider about taking a low-dose aspirin or statin. °What's next? °· Go to your health care provider once a year for a well check visit. °· Ask your health care provider how often you should have your eyes and teeth checked. °· Stay up to date on all vaccines. °This information is not intended to replace advice given to you by your health care provider. Make sure you discuss any questions you have with your health care provider. °Document Revised: 01/26/2018 Document Reviewed: 01/26/2018 °Elsevier Patient Education © 2020 [employer] ° °

## 2020-01-29 NOTE — Assessment & Plan Note (Addendum)
Has used Prolia in past but had trouble with her insurance and the cost. We will repeat Dexa scan and she is asked whether she would consider trying to get Prolia again

## 2020-01-30 DIAGNOSIS — Z9889 Other specified postprocedural states: Secondary | ICD-10-CM | POA: Insufficient documentation

## 2020-01-30 DIAGNOSIS — Z86010 Personal history of colonic polyps: Secondary | ICD-10-CM | POA: Insufficient documentation

## 2020-01-30 LAB — LIPID PANEL
Cholesterol: 215 mg/dL — ABNORMAL HIGH (ref 0–200)
HDL: 65.1 mg/dL (ref >39.00)
LDL Cholesterol: 118 mg/dL — ABNORMAL HIGH (ref 0–99)
NonHDL: 149.88
Total CHOL/HDL Ratio: 3
Triglycerides: 158 mg/dL — ABNORMAL HIGH (ref 0.0–149.0)
VLDL: 31.6 mg/dL (ref 0.0–40.0)

## 2020-01-30 LAB — CBC WITH DIFFERENTIAL/PLATELET
Basophils Absolute: 0.1 10*3/uL (ref 0.0–0.1)
Basophils Relative: 0.9 % (ref 0.0–3.0)
Eosinophils Absolute: 0.2 10*3/uL (ref 0.0–0.7)
Eosinophils Relative: 2.6 % (ref 0.0–5.0)
HCT: 44.5 % (ref 36.0–46.0)
Hemoglobin: 15.1 g/dL — ABNORMAL HIGH (ref 12.0–15.0)
Lymphocytes Relative: 27.3 % (ref 12.0–46.0)
Lymphs Abs: 2.3 10*3/uL (ref 0.7–4.0)
MCHC: 34 g/dL (ref 30.0–36.0)
MCV: 85.9 fl (ref 78.0–100.0)
Monocytes Absolute: 0.7 10*3/uL (ref 0.1–1.0)
Monocytes Relative: 7.8 % (ref 3.0–12.0)
Neutro Abs: 5.2 10*3/uL (ref 1.4–7.7)
Neutrophils Relative %: 61.4 % (ref 43.0–77.0)
Platelets: 259 10*3/uL (ref 150.0–400.0)
RBC: 5.18 Mil/uL — ABNORMAL HIGH (ref 3.87–5.11)
RDW: 13 % (ref 11.5–15.5)
WBC: 8.4 10*3/uL (ref 4.0–10.5)

## 2020-01-30 LAB — COMPREHENSIVE METABOLIC PANEL
ALT: 17 U/L (ref 0–35)
AST: 12 U/L (ref 0–37)
Albumin: 4.8 g/dL (ref 3.5–5.2)
Alkaline Phosphatase: 90 U/L (ref 39–117)
BUN: 14 mg/dL (ref 6–23)
CO2: 30 mEq/L (ref 19–32)
Calcium: 10.2 mg/dL (ref 8.4–10.5)
Chloride: 98 mEq/L (ref 96–112)
Creatinine, Ser: 0.74 mg/dL (ref 0.40–1.20)
GFR: 84.56 mL/min (ref >60.00)
Glucose, Bld: 145 mg/dL — ABNORMAL HIGH (ref 70–99)
Potassium: 4.2 mEq/L (ref 3.5–5.1)
Sodium: 137 mEq/L (ref 135–145)
Total Bilirubin: 0.5 mg/dL (ref 0.2–1.2)
Total Protein: 7.2 g/dL (ref 6.0–8.3)

## 2020-01-30 LAB — HEMOGLOBIN A1C: Hgb A1c MFr Bld: 7.8 % — ABNORMAL HIGH (ref 4.6–6.5)

## 2020-01-30 NOTE — Assessment & Plan Note (Addendum)
Patient encouraged to maintain heart healthy diet, regular exercise, adequate sleep. Consider daily probiotics. Take medications as prescribed. Labs ordered and reviewed. Pneumovax given today. MGM in 2021, repeats annually

## 2020-01-30 NOTE — Assessment & Plan Note (Signed)
Well controlled, no changes to meds. Encouraged heart healthy diet such as the DASH diet and exercise as tolerated.  °

## 2020-01-30 NOTE — Assessment & Plan Note (Signed)
Encouraged heart healthy diet, increase exercise, avoid trans fats, consider a krill oil cap daily. She is asked to consider a statin moving forward

## 2020-01-30 NOTE — Progress Notes (Signed)
Subjective:    Patient ID: [name] [name], female    DOB: 03-22-1955, 64 y.o.   MRN: [id number]  Chief Complaint  Patient presents with  . Annual Exam    HPI Patient is in today for annual preventative exam and follow up on chronic medical concerns. No recent febrile illness or hospitalizations. No polyuria or polydipsia. She is trying to stay active and maintain a heart healthy diet. Denies CP/palp/SOB/HA/congestion/fevers/GI or GU c/o. Taking meds as prescribed  Past Medical History:  Diagnosis Date  . Back pain 06/13/2015  . Diabetes mellitus    started Metformin 12/2012  . History of shingles 2012   back  . Hyperlipidemia   . Hypertension   . Hypothyroidism 07/29/2010   S/p FNA showing non-neoplastic goiter 7/12 w/ Dr Pollyann Kennedy   . Obesity 08/06/2016  . Osteoporosis 08/06/2016  . Vitamin D deficiency 08/06/2016    Past Surgical History:  Procedure Laterality Date  . ABDOMINAL HYSTERECTOMY  1997  . APPENDECTOMY    . MASS EXCISION  03/02/2011   Procedure: EXCISION MASS;  Surgeon: Nicki Reaper, MD;  Location: [facility];  Service: Orthopedics;  Laterality: Right;  Excision Cyst Interphangeal Right Thumb  . OOPHORECTOMY    . SHOULDER ARTHROSCOPY W/ ROTATOR CUFF REPAIR  2007   rt  . TONSILLECTOMY    . TRIGGER FINGER RELEASE  11/01/2011   Procedure: RELEASE TRIGGER FINGER/A-1 PULLEY;  Surgeon: Nicki Reaper, MD;  Location: [facility];  Service: Orthopedics;  Laterality: Right;  right thumb, possible excision cyst    Family History  Problem Relation Age of Onset  . Hypertension Mother   . Diabetes Mother   . Breast cancer Mother 56  . Other Mother   . Hypertension Father   . Cancer Father        throat  . Alcohol abuse Father   . Hypertension Brother   . Heart disease Brother        heart murmur  . Alcohol abuse Brother   . Cancer Maternal Grandmother   . Heart disease Maternal Grandfather   . Heart disease Paternal Grandmother   . Cancer  Maternal Aunt   . Breast cancer Maternal Aunt     Social History   Socioeconomic History  . Marital status: Married    Spouse name: Not on file  . Number of children: Not on file  . Years of education: Not on file  . Highest education level: Not on file  Occupational History  . Occupation: Physiological scientist  Tobacco Use  . Smoking status: Never Smoker  . Smokeless tobacco: Never Used  Substance and Sexual Activity  . Alcohol use: No  . Drug use: No  . Sexual activity: Yes    Partners: Male    Comment: lives with husband, works at [employer], no major dietary   Other Topics Concern  . Not on file  Social History Narrative   Married; step daughter   Regular exercise: walk 2 days a week   Caffeine use: 2 cups of coffee daily   Social Determinants of Health   Financial Resource Strain: Not on file  Food Insecurity: Not on file  Transportation Needs: Not on file  Physical Activity: Not on file  Stress: Not on file  Social Connections: Not on file  Intimate Partner Violence: Not on file    Outpatient Medications Prior to Visit  Medication Sig Dispense Refill  . amLODipine (NORVASC) 5 MG tablet Take 1  tablet (5 mg total) by mouth daily. 90 tablet 0  . Ascorbic Acid (VITAMIN C) 1000 MG tablet Take 1,000 mg by mouth daily.    Marland Kitchen. aspirin 81 MG EC tablet Take 81 mg by mouth daily.    . calcium carbonate (OS-CAL - DOSED IN MG OF ELEMENTAL CALCIUM) 1250 (500 Ca) MG tablet Take 1 tablet by mouth daily.    . diclofenac (VOLTAREN) 75 MG EC tablet Take 1 tablet (75 mg total) by mouth 2 (two) times daily as needed. 60 tablet 2  . glipiZIDE (GLUCOTROL XL) 5 MG 24 hr tablet Take 1 tablet (5 mg total) by mouth in the morning, at noon, and at bedtime. 270 tablet 1  . levothyroxine (SYNTHROID) 75 MCG tablet TAKE 1 TABLET BY MOUTH EVERY DAY BEFORE BREAKFAST 90 tablet 0  . lisinopril (ZESTRIL) 2.5 MG tablet Take 1 tablet (2.5 mg total) by mouth daily. 90 tablet 1  . meclizine (ANTIVERT)  25 MG tablet Take 1 tablet (25 mg total) by mouth 3 (three) times daily as needed for dizziness. 30 tablet 1  . Multiple Vitamin (MULTIVITAMIN) tablet Take 1 tablet by mouth daily.    . Omega-3 Fatty Acids (FISH OIL PO) Take by mouth daily.    Marland Kitchen. triamcinolone cream (KENALOG) 0.1 % Apply 1 application topically 2 (two) times daily. 80 g 2  . meloxicam (MOBIC) 15 MG tablet Take 1 tablet (15 mg total) by mouth daily. 30 tablet 2  . nitrofurantoin, macrocrystal-monohydrate, (MACROBID) 100 MG capsule Take 1 capsule (100 mg total) by mouth 2 (two) times daily. 14 capsule 0  . phenazopyridine (PYRIDIUM) 200 MG tablet Take 1 tablet (200 mg total) by mouth 3 (three) times daily as needed for pain. 10 tablet 0   No facility-administered medications prior to visit.    Allergies  Allergen Reactions  . Codeine   . Tramadol     Review of Systems  Constitutional: Negative for fever and malaise/fatigue.  HENT: Negative for congestion.   Eyes: Negative for blurred vision, double vision and discharge.  Respiratory: Negative for cough and shortness of breath.   Cardiovascular: Negative for chest pain, palpitations and leg swelling.  Gastrointestinal: Negative for abdominal pain, blood in stool and nausea.  Genitourinary: Negative for dysuria and frequency.  Musculoskeletal: Negative for falls, myalgias and neck pain.  Skin: Negative for rash.  Neurological: Negative for dizziness, tingling, focal weakness, loss of consciousness and headaches.  Endo/Heme/Allergies: Negative for environmental allergies.  Psychiatric/Behavioral: Negative for depression. The patient is not nervous/anxious and does not have insomnia.        Objective:    Physical Exam Constitutional:      General: She is not in acute distress.    Appearance: She is well-developed and well-nourished.  HENT:     Head: Normocephalic and atraumatic.  Eyes:     Conjunctiva/sclera: Conjunctivae normal.  Neck:     Thyroid: No  thyromegaly.  Cardiovascular:     Rate and Rhythm: Normal rate and regular rhythm.     Heart sounds: Normal heart sounds. No murmur heard.   Pulmonary:     Effort: Pulmonary effort is normal. No respiratory distress.     Breath sounds: Normal breath sounds.  Abdominal:     General: Bowel sounds are normal. There is no distension.     Palpations: Abdomen is soft. There is no mass.     Tenderness: There is no abdominal tenderness.  Musculoskeletal:        General: No  edema.     Cervical back: Neck supple.  Lymphadenopathy:     Cervical: No cervical adenopathy.  Skin:    General: Skin is warm and dry.  Neurological:     Mental Status: She is alert and oriented to person, place, and time.  Psychiatric:        Mood and Affect: Mood and affect normal.        Behavior: Behavior normal.     BP 120/74   Pulse 74   Temp 98.2 F (36.8 C) (Oral)   Resp 16   Wt 202 lb 12.8 oz (92 kg)   SpO2 99%   BMI 31.29 kg/m  Wt Readings from Last 3 Encounters:  01/29/20 202 lb 12.8 oz (92 kg)  03/08/19 211 lb 9.6 oz (96 kg)  02/24/18 206 lb 3.2 oz (93.5 kg)    Diabetic Foot Exam - Simple   No data filed    Lab Results  Component Value Date   WBC 8.4 01/29/2020   HGB 15.1 (H) 01/29/2020   HCT 44.5 01/29/2020   PLT 259.0 01/29/2020   GLUCOSE 145 (H) 01/29/2020   CHOL 215 (H) 01/29/2020   TRIG 158.0 (H) 01/29/2020   HDL 65.10 01/29/2020   LDLCALC 118 (H) 01/29/2020   ALT 17 01/29/2020   AST 12 01/29/2020   NA 137 01/29/2020   K 4.2 01/29/2020   CL 98 01/29/2020   CREATININE 0.74 01/29/2020   BUN 14 01/29/2020   CO2 30 01/29/2020   TSH 2.71 10/29/2019   HGBA1C 7.8 (H) 01/29/2020   MICROALBUR <0.7 08/22/2017    Lab Results  Component Value Date   TSH 2.71 10/29/2019   Lab Results  Component Value Date   WBC 8.4 01/29/2020   HGB 15.1 (H) 01/29/2020   HCT 44.5 01/29/2020   MCV 85.9 01/29/2020   PLT 259.0 01/29/2020   Lab Results  Component Value Date   NA 137  01/29/2020   K 4.2 01/29/2020   CO2 30 01/29/2020   GLUCOSE 145 (H) 01/29/2020   BUN 14 01/29/2020   CREATININE 0.74 01/29/2020   BILITOT 0.5 01/29/2020   ALKPHOS 90 01/29/2020   AST 12 01/29/2020   ALT 17 01/29/2020   PROT 7.2 01/29/2020   ALBUMIN 4.8 01/29/2020   CALCIUM 10.2 01/29/2020   GFR 84.56 01/29/2020   Lab Results  Component Value Date   CHOL 215 (H) 01/29/2020   Lab Results  Component Value Date   HDL 65.10 01/29/2020   Lab Results  Component Value Date   LDLCALC 118 (H) 01/29/2020   Lab Results  Component Value Date   TRIG 158.0 (H) 01/29/2020   Lab Results  Component Value Date   CHOLHDL 3 01/29/2020   Lab Results  Component Value Date   HGBA1C 7.8 (H) 01/29/2020       Assessment & Plan:   Problem List Items Addressed This Visit    Essential hypertension    Well controlled, no changes to meds. Encouraged heart healthy diet such as the DASH diet and exercise as tolerated.       Annual physical exam    Patient encouraged to maintain heart healthy diet, regular exercise, adequate sleep. Consider daily probiotics. Take medications as prescribed. Labs ordered and reviewed. Pneumovax given today. MGM in 2021, repeats annually      DM type 2 (diabetes mellitus, type 2) (HCC) - Primary    hgba1c acceptable, minimize simple carbs. Increase exercise as tolerated. Continue current meds  Relevant Orders   Hemoglobin A1c (Completed)   Hyperlipidemia with target LDL less than 100    Encouraged heart healthy diet, increase exercise, avoid trans fats, consider a krill oil cap daily. She is asked to consider a statin moving forward      Relevant Orders   CBC with Differential/Platelet (Completed)   Comprehensive metabolic panel (Completed)   Lipid panel (Completed)   Osteoporosis, post-menopausal    Has used Prolia in past but had trouble with her insurance and the cost. We will repeat Dexa scan and she is asked whether she would consider trying to  get Prolia again      Relevant Orders   DG Bone Density   Vitamin D deficiency    Supplement and monitor      Relevant Orders   Vitamin D 1,25 dihydroxy   H/O colonoscopy with polypectomy    Last colonoscopy at [facility] in May 2021 with one polyp. Patient with FH of colon cancer so repeats colonoscopies. So she repeats every 5 years.       Other Visit Diagnoses    Type 2 diabetes mellitus with complication, without long-term current use of insulin (HCC)   (Chronic)     Need for pneumococcal vaccination       Relevant Orders   Pneumococcal polysaccharide vaccine 23-valent greater than or equal to 2yo subcutaneous/IM (Completed)      I am having Elease Hashimoto C. Herdt maintain her aspirin, calcium carbonate, Omega-3 Fatty Acids (FISH OIL PO), multivitamin, vitamin C, triamcinolone, meloxicam, diclofenac, meclizine, nitrofurantoin (macrocrystal-monohydrate), phenazopyridine, lisinopril, glipiZIDE, levothyroxine, and amLODipine.  No orders of the defined types were placed in this encounter.    Danise Edge, MD

## 2020-01-30 NOTE — Assessment & Plan Note (Signed)
Last colonoscopy at [facility] in May 2021 with one polyp. Patient with FH of colon cancer so repeats colonoscopies. So she repeats every 5 years.

## 2020-01-30 NOTE — Assessment & Plan Note (Signed)
hgba1c acceptable, minimize simple carbs. Increase exercise as tolerated. Continue current meds 

## 2020-01-30 NOTE — Assessment & Plan Note (Signed)
Supplement and monitor 

## 2020-01-31 ENCOUNTER — Other Ambulatory Visit: Payer: Self-pay | Admitting: *Deleted

## 2020-01-31 DIAGNOSIS — E785 Hyperlipidemia, unspecified: Secondary | ICD-10-CM

## 2020-01-31 DIAGNOSIS — I10 Essential (primary) hypertension: Secondary | ICD-10-CM

## 2020-01-31 DIAGNOSIS — E119 Type 2 diabetes mellitus without complications: Secondary | ICD-10-CM

## 2020-01-31 MED ORDER — ROSUVASTATIN CALCIUM 5 MG PO TABS: 5.0000 mg | ORAL_TABLET | Freq: Every day | ORAL | 3 refills | Status: DC

## 2020-02-01 LAB — VITAMIN D 1,25 DIHYDROXY
Vitamin D 1, 25 (OH)2 Total: 71 pg/mL (ref 18–72)
Vitamin D2 1, 25 (OH)2: <8 pg/mL
Vitamin D3 1, 25 (OH)2: 71 pg/mL

## 2020-03-06 ENCOUNTER — Other Ambulatory Visit: Payer: Self-pay | Admitting: Family Medicine

## 2020-03-18 ENCOUNTER — Ambulatory Visit (HOSPITAL_BASED_OUTPATIENT_CLINIC_OR_DEPARTMENT_OTHER)
Admission: RE | Admit: 2020-03-18 | Discharge: 2020-03-18 | Disposition: A | Discharge status: Home / Self Care | Payer: Medicare Other | Source: Ambulatory Visit | Attending: Family Medicine | Admitting: Family Medicine

## 2020-03-18 ENCOUNTER — Other Ambulatory Visit: Payer: Self-pay

## 2020-03-18 ENCOUNTER — Encounter: Payer: Self-pay | Admitting: Family Medicine

## 2020-03-18 DIAGNOSIS — M810 Age-related osteoporosis without current pathological fracture: Secondary | ICD-10-CM | POA: Insufficient documentation

## 2020-04-25 ENCOUNTER — Encounter: Payer: Self-pay | Admitting: Family Medicine

## 2020-04-25 LAB — HM DIABETES EYE EXAM

## 2020-04-27 ENCOUNTER — Other Ambulatory Visit: Payer: Self-pay | Admitting: Family Medicine

## 2020-04-27 DIAGNOSIS — I10 Essential (primary) hypertension: Secondary | ICD-10-CM

## 2020-05-02 ENCOUNTER — Other Ambulatory Visit (INDEPENDENT_AMBULATORY_CARE_PROVIDER_SITE_OTHER): Payer: Medicare Other

## 2020-05-02 ENCOUNTER — Other Ambulatory Visit: Payer: Self-pay

## 2020-05-02 DIAGNOSIS — E785 Hyperlipidemia, unspecified: Secondary | ICD-10-CM | POA: Diagnosis not present

## 2020-05-02 DIAGNOSIS — E119 Type 2 diabetes mellitus without complications: Secondary | ICD-10-CM | POA: Diagnosis not present

## 2020-05-02 DIAGNOSIS — I10 Essential (primary) hypertension: Secondary | ICD-10-CM | POA: Diagnosis not present

## 2020-05-02 LAB — LIPID PANEL
Cholesterol: 136 mg/dL (ref 0–200)
HDL: 58.9 mg/dL (ref >39.00)
LDL Cholesterol: 61 mg/dL (ref 0–99)
NonHDL: 77.45
Total CHOL/HDL Ratio: 2
Triglycerides: 80 mg/dL (ref 0.0–149.0)
VLDL: 16 mg/dL (ref 0.0–40.0)

## 2020-05-02 LAB — COMPREHENSIVE METABOLIC PANEL
ALT: 15 U/L (ref 0–35)
AST: 12 U/L (ref 0–37)
Albumin: 4.3 g/dL (ref 3.5–5.2)
Alkaline Phosphatase: 90 U/L (ref 39–117)
BUN: 18 mg/dL (ref 6–23)
CO2: 30 mEq/L (ref 19–32)
Calcium: 9.3 mg/dL (ref 8.4–10.5)
Chloride: 100 mEq/L (ref 96–112)
Creatinine, Ser: 0.75 mg/dL (ref 0.40–1.20)
GFR: 83.06 mL/min (ref >60.00)
Glucose, Bld: 163 mg/dL — ABNORMAL HIGH (ref 70–99)
Potassium: 4.8 mEq/L (ref 3.5–5.1)
Sodium: 138 mEq/L (ref 135–145)
Total Bilirubin: 0.6 mg/dL (ref 0.2–1.2)
Total Protein: 6.3 g/dL (ref 6.0–8.3)

## 2020-05-02 LAB — HEMOGLOBIN A1C: Hgb A1c MFr Bld: 8.6 % — ABNORMAL HIGH (ref 4.6–6.5)

## 2020-05-05 ENCOUNTER — Other Ambulatory Visit: Payer: Self-pay | Admitting: *Deleted

## 2020-05-06 ENCOUNTER — Telehealth: Payer: Self-pay

## 2020-05-06 ENCOUNTER — Other Ambulatory Visit: Payer: Self-pay | Admitting: *Deleted

## 2020-05-06 MED ORDER — GLIPIZIDE ER 10 MG PO TB24: 10.0000 mg | ORAL_TABLET | Freq: Every day | ORAL | 1 refills | Status: DC

## 2020-05-06 NOTE — Telephone Encounter (Signed)
Spoke with pharmacist to clarify medication. It was glipizide 10 mg was ordered for 180 tablets but it needed to be 90 tablets.

## 2020-05-08 ENCOUNTER — Other Ambulatory Visit: Payer: Medicare Other

## 2020-05-22 ENCOUNTER — Other Ambulatory Visit: Payer: Self-pay | Admitting: Family Medicine

## 2020-08-04 ENCOUNTER — Ambulatory Visit (INDEPENDENT_AMBULATORY_CARE_PROVIDER_SITE_OTHER): Payer: Medicare Other | Admitting: Family Medicine

## 2020-08-04 ENCOUNTER — Other Ambulatory Visit: Payer: Self-pay

## 2020-08-04 DIAGNOSIS — E559 Vitamin D deficiency, unspecified: Secondary | ICD-10-CM

## 2020-08-04 DIAGNOSIS — E038 Other specified hypothyroidism: Secondary | ICD-10-CM | POA: Diagnosis not present

## 2020-08-04 DIAGNOSIS — E785 Hyperlipidemia, unspecified: Secondary | ICD-10-CM

## 2020-08-04 DIAGNOSIS — E119 Type 2 diabetes mellitus without complications: Secondary | ICD-10-CM | POA: Diagnosis not present

## 2020-08-04 DIAGNOSIS — I10 Essential (primary) hypertension: Secondary | ICD-10-CM

## 2020-08-04 DIAGNOSIS — F4320 Adjustment disorder, unspecified: Secondary | ICD-10-CM

## 2020-08-04 LAB — COMPREHENSIVE METABOLIC PANEL
ALT: 19 U/L (ref 0–35)
AST: 19 U/L (ref 0–37)
Albumin: 4.8 g/dL (ref 3.5–5.2)
Alkaline Phosphatase: 87 U/L (ref 39–117)
BUN: 24 mg/dL — ABNORMAL HIGH (ref 6–23)
CO2: 24 mEq/L (ref 19–32)
Calcium: 9.6 mg/dL (ref 8.4–10.5)
Chloride: 103 mEq/L (ref 96–112)
Creatinine, Ser: 0.82 mg/dL (ref 0.40–1.20)
GFR: 74.49 mL/min (ref >60.00)
Glucose, Bld: 192 mg/dL — ABNORMAL HIGH (ref 70–99)
Potassium: 4.2 mEq/L (ref 3.5–5.1)
Sodium: 138 mEq/L (ref 135–145)
Total Bilirubin: 0.7 mg/dL (ref 0.2–1.2)
Total Protein: 6.8 g/dL (ref 6.0–8.3)

## 2020-08-04 LAB — CBC
HCT: 43 % (ref 36.0–46.0)
Hemoglobin: 14.5 g/dL (ref 12.0–15.0)
MCHC: 33.7 g/dL (ref 30.0–36.0)
MCV: 86.3 fl (ref 78.0–100.0)
Platelets: 236 10*3/uL (ref 150.0–400.0)
RBC: 4.98 Mil/uL (ref 3.87–5.11)
RDW: 12.9 % (ref 11.5–15.5)
WBC: 8.3 10*3/uL (ref 4.0–10.5)

## 2020-08-04 LAB — LIPID PANEL
Cholesterol: 146 mg/dL (ref 0–200)
HDL: 62.6 mg/dL (ref >39.00)
LDL Cholesterol: 66 mg/dL (ref 0–99)
NonHDL: 83.36
Total CHOL/HDL Ratio: 2
Triglycerides: 85 mg/dL (ref 0.0–149.0)
VLDL: 17 mg/dL (ref 0.0–40.0)

## 2020-08-04 LAB — HEMOGLOBIN A1C: Hgb A1c MFr Bld: 8.7 % — ABNORMAL HIGH (ref 4.6–6.5)

## 2020-08-04 LAB — TSH: TSH: 2.32 u[IU]/mL (ref 0.35–4.50)

## 2020-08-04 LAB — VITAMIN D 25 HYDROXY (VIT D DEFICIENCY, FRACTURES): VITD: 37.54 ng/mL (ref 30.00–100.00)

## 2020-08-04 MED ORDER — ROSUVASTATIN CALCIUM 5 MG PO TABS: 5.0000 mg | ORAL_TABLET | Freq: Every day | ORAL | 2 refills | Status: DC

## 2020-08-04 NOTE — Assessment & Plan Note (Signed)
hgba1c acceptable, minimize simple carbs. Increase exercise as tolerated. Continue current meds 

## 2020-08-04 NOTE — Assessment & Plan Note (Signed)
Very stressful with her husband having Parkinson's or PSP. She does not want any meds or counseling for now

## 2020-08-04 NOTE — Assessment & Plan Note (Signed)
On Levothyroxine, continue to monitor 

## 2020-08-04 NOTE — Assessment & Plan Note (Signed)
Well controlled, no changes to meds. Encouraged heart healthy diet such as the DASH diet and exercise as tolerated.  °

## 2020-08-04 NOTE — Patient Instructions (Signed)
High Cholesterol  High cholesterol is a condition in which the blood has high levels of a white, waxy substance similar to fat (cholesterol). The liver makes all the cholesterol that the body needs. The human body needs small amounts of cholesterol to help build cells. A person gets extra orexcess cholesterol from the food that he or she eats. The blood carries cholesterol from the liver to the rest of the body. If you have high cholesterol, deposits (plaques) may build up on the walls of your arteries. Arteries are the blood vessels that carry blood away from your heart. These plaques make the arteries narrowand stiff. Cholesterol plaques increase your risk for heart attack and stroke. Work withyour health care provider to keep your cholesterol levels in a healthy range. What increases the risk? The following factors may make you more likely to develop this condition: Eating foods that are high in animal fat (saturated fat) or cholesterol. Being overweight. Not getting enough exercise. A family history of high cholesterol (familial hypercholesterolemia). Use of tobacco products. Having diabetes. What are the signs or symptoms? There are no symptoms of this condition. How is this diagnosed? This condition may be diagnosed based on the results of a blood test. If you are older than 67 years of age, your health care provider may check your cholesterol levels every 4-6 years. You may be checked more often if you have high cholesterol or other risk factors for heart disease. The blood test for cholesterol measures: "Bad" cholesterol, or LDL cholesterol. This is the main type of cholesterol that causes heart disease. The desired level is less than 100 mg/dL. "Good" cholesterol, or HDL cholesterol. HDL helps protect against heart disease by cleaning the arteries and carrying the LDL to the liver for processing. The desired level for HDL is 60 mg/dL or higher. Triglycerides. These are fats that your  body can store or burn for energy. The desired level is less than 150 mg/dL. Total cholesterol. This measures the total amount of cholesterol in your blood and includes LDL, HDL, and triglycerides. The desired level is less than 200 mg/dL. How is this treated? This condition may be treated with: Diet changes. You may be asked to eat foods that have more fiber and less saturated fats or added sugar. Lifestyle changes. These may include regular exercise, maintaining a healthy weight, and quitting use of tobacco products. Medicines. These are given when diet and lifestyle changes have not worked. You may be prescribed a statin medicine to help lower your cholesterol levels. Follow these instructions at home: Eating and drinking  Eat a healthy, balanced diet. This diet includes: Daily servings of a variety of fresh, frozen, or canned fruits and vegetables. Daily servings of whole grain foods that are rich in fiber. Foods that are low in saturated fats and trans fats. These include poultry and fish without skin, lean cuts of meat, and low-fat dairy products. A variety of fish, especially oily fish that contain omega-3 fatty acids. Aim to eat fish at least 2 times a week. Avoid foods and drinks that have added sugar. Use healthy cooking methods, such as roasting, grilling, broiling, baking, poaching, steaming, and stir-frying. Do not fry your food except for stir-frying.  Lifestyle  Get regular exercise. Aim to exercise for a total of 150 minutes a week. Increase your activity level by doing activities such as gardening, walking, and taking the stairs. Do not use any products that contain nicotine or tobacco, such as cigarettes, e-cigarettes, and chewing tobacco.   If you need help quitting, ask your health care provider.  General instructions Take over-the-counter and prescription medicines only as told by your health care provider. Keep all follow-up visits as told by your health care provider.  This is important. Where to find more information American Heart Association: www.heart.org National Heart, Lung, and Blood Institute: www.nhlbi.nih.gov Contact a health care provider if: You have trouble achieving or maintaining a healthy diet or weight. You are starting an exercise program. You are unable to stop smoking. Get help right away if: You have chest pain. You have trouble breathing. You have any symptoms of a stroke. "BE FAST" is an easy way to remember the main warning signs of a stroke: B - Balance. Signs are dizziness, sudden trouble walking, or loss of balance. E - Eyes. Signs are trouble seeing or a sudden change in vision. F - Face. Signs are sudden weakness or numbness of the face, or the face or eyelid drooping on one side. A - Arms. Signs are weakness or numbness in an arm. This happens suddenly and usually on one side of the body. S - Speech. Signs are sudden trouble speaking, slurred speech, or trouble understanding what people say. T - Time. Time to call emergency services. Write down what time symptoms started. You have other signs of a stroke, such as: A sudden, severe headache with no known cause. Nausea or vomiting. Seizure. These symptoms may represent a serious problem that is an emergency. Do not wait to see if the symptoms will go away. Get medical help right away. Call your local emergency services (911 in the [place] Do not drive yourself to the hospital. Summary Cholesterol plaques increase your risk for heart attack and stroke. Work with your health care provider to keep your cholesterol levels in a healthy range. Eat a healthy, balanced diet, get regular exercise, and maintain a healthy weight. Do not use any products that contain nicotine or tobacco, such as cigarettes, e-cigarettes, and chewing tobacco. Get help right away if you have any symptoms of a stroke. This information is not intended to replace advice given to you by your health care  provider. Make sure you discuss any questions you have with your healthcare provider. Document Revised: 01/01/2019 Document Reviewed: 01/01/2019 Elsevier Patient Education  2022 [employer]  

## 2020-08-04 NOTE — Assessment & Plan Note (Addendum)
Encourage heart healthy diet such as MIND or DASH diet, increase exercise, avoid trans fats, simple carbohydrates and processed foods, consider a krill or fish or flaxseed oil cap daily. Tolerating Crestor 

## 2020-08-04 NOTE — Assessment & Plan Note (Signed)
Supplement and monitor 

## 2020-08-04 NOTE — Progress Notes (Signed)
Subjective:    Patient ID: Minerva Festeratricia C Bloor, female    DOB: 1955-03-03, 65 y.o.   MRN: [id number]  Chief Complaint  Patient presents with   Follow-up   Hypertension   Diabetes    HPI Patient is in today for follow up on chronic medical concerns. No recent febrile illness or hospitalizations. She has been under a great deal of stress as they try and figure out what her husband's neurologic diagnosis is. There has been Parkinson's vs PSP diagnosis. She is trying to stay active and eat a heart healthy diet. Denies CP/palp/SOB/HA/congestion/fevers/GI or GU c/o. Taking meds as prescribed   Past Medical History:  Diagnosis Date   Back pain 06/13/2015   Diabetes mellitus    started Metformin 12/2012   History of shingles 2012   back   Hyperlipidemia    Hypertension    Hypothyroidism 07/29/2010   S/p FNA showing non-neoplastic goiter 7/12 w/ Dr Pollyann Kennedyosen    Obesity 08/06/2016   Osteoporosis 08/06/2016   Vitamin D deficiency 08/06/2016    Past Surgical History:  Procedure Laterality Date   ABDOMINAL HYSTERECTOMY  1997   APPENDECTOMY     MASS EXCISION  03/02/2011   Procedure: EXCISION MASS;  Surgeon: Nicki ReaperGary R Kuzma, MD;  Location: [facility];  Service: Orthopedics;  Laterality: Right;  Excision Cyst Interphangeal Right Thumb   OOPHORECTOMY     SHOULDER ARTHROSCOPY W/ ROTATOR CUFF REPAIR  2007   rt   TONSILLECTOMY     TRIGGER FINGER RELEASE  11/01/2011   Procedure: RELEASE TRIGGER FINGER/A-1 PULLEY;  Surgeon: Nicki ReaperGary R Kuzma, MD;  Location: [facility];  Service: Orthopedics;  Laterality: Right;  right thumb, possible excision cyst    Family History  Problem Relation Age of Onset   Hypertension Mother    Diabetes Mother    Breast cancer Mother [id number]   Other Mother    Hypertension Father    Cancer Father        throat   Alcohol abuse Father    Hypertension Brother    Heart disease Brother        heart murmur   Alcohol abuse Brother    Cancer Maternal  Grandmother    Heart disease Maternal Grandfather    Heart disease Paternal Grandmother    Cancer Maternal Aunt    Breast cancer Maternal Aunt     Social History   Socioeconomic History   Marital status: Married    Spouse name: Not on file   Number of children: Not on file   Years of education: Not on file   Highest education level: Not on file  Occupational History   Occupation: Physiological scientistoffice administrator  Tobacco Use   Smoking status: Never   Smokeless tobacco: Never  Substance and Sexual Activity   Alcohol use: No   Drug use: No   Sexual activity: Yes    Partners: Male    Comment: lives with husband, works at Production [employer], [employer] shop, no major dietary   Other Topics Concern   Not on file  Social History Narrative   Married; step daughter   Regular exercise: walk 2 days a week   Caffeine use: 2 cups of coffee daily   Social Determinants of Health   Financial Resource Strain: Not on file  Food Insecurity: Not on file  Transportation Needs: Not on file  Physical Activity: Not on file  Stress: Not on file  Social Connections: Not on file  Intimate Partner Violence: Not  on file    Outpatient Medications Prior to Visit  Medication Sig Dispense Refill   amLODipine (NORVASC) 5 MG tablet TAKE 1 TABLET BY MOUTH DAILY 90 tablet 1   Ascorbic Acid (VITAMIN C) 1000 MG tablet Take 1,000 mg by mouth daily.     aspirin 81 MG EC tablet Take 81 mg by mouth daily.     calcium carbonate (OS-CAL - DOSED IN MG OF ELEMENTAL CALCIUM) 1250 (500 Ca) MG tablet Take 1 tablet by mouth daily.     diclofenac (VOLTAREN) 75 MG EC tablet Take 1 tablet (75 mg total) by mouth 2 (two) times daily as needed. 60 tablet 2   glipiZIDE (GLIPIZIDE XL) 10 MG 24 hr tablet Take 1 tablet (10 mg total) by mouth daily with breakfast. 180 tablet 1   levothyroxine (SYNTHROID) 75 MCG tablet TAKE 1 TABLET BY MOUTH EVERY DAY BEFORE BREAKFAST 90 tablet 1   lisinopril (ZESTRIL) 2.5 MG tablet TAKE 1 TABLET BY MOUTH DAILY. GENERIC  EQUIVALENT FOR ZESTRIL 90 tablet 1   meclizine (ANTIVERT) 25 MG tablet Take 1 tablet (25 mg total) by mouth 3 (three) times daily as needed for dizziness. 30 tablet 1   Multiple Vitamin (MULTIVITAMIN) tablet Take 1 tablet by mouth daily.     Omega-3 Fatty Acids (FISH OIL PO) Take by mouth daily.     triamcinolone cream (KENALOG) 0.1 % Apply 1 application topically 2 (two) times daily. 80 g 2   rosuvastatin (CRESTOR) 5 MG tablet TAKE 1 TABLET BY MOUTH DAILY 30 tablet 3   meloxicam (MOBIC) 15 MG tablet Take 1 tablet (15 mg total) by mouth daily. 30 tablet 2   nitrofurantoin, macrocrystal-monohydrate, (MACROBID) 100 MG capsule Take 1 capsule (100 mg total) by mouth 2 (two) times daily. 14 capsule 0   phenazopyridine (PYRIDIUM) 200 MG tablet Take 1 tablet (200 mg total) by mouth 3 (three) times daily as needed for pain. 10 tablet 0   No facility-administered medications prior to visit.    Allergies  Allergen Reactions   Codeine    Tramadol     Review of Systems  Constitutional:  Positive for malaise/fatigue. Negative for fever.  HENT:  Negative for congestion.   Eyes:  Negative for blurred vision.  Respiratory:  Negative for shortness of breath.   Cardiovascular:  Negative for chest pain, palpitations and leg swelling.  Gastrointestinal:  Negative for abdominal pain, blood in stool and nausea.  Genitourinary:  Negative for dysuria and frequency.  Musculoskeletal:  Negative for falls.  Skin:  Negative for rash.  Neurological:  Negative for dizziness, loss of consciousness and headaches.  Endo/Heme/Allergies:  Negative for environmental allergies.  Psychiatric/Behavioral:  Negative for depression. The patient is not nervous/anxious.       Objective:    Physical Exam Constitutional:      General: She is not in acute distress.    Appearance: She is well-developed.  HENT:     Head: Normocephalic and atraumatic.  Eyes:     Conjunctiva/sclera: Conjunctivae normal.  Neck:      Thyroid: No thyromegaly.  Cardiovascular:     Rate and Rhythm: Normal rate and regular rhythm.     Heart sounds: Normal heart sounds. No murmur heard. Pulmonary:     Effort: Pulmonary effort is normal. No respiratory distress.     Breath sounds: Normal breath sounds.  Abdominal:     General: Bowel sounds are normal. There is no distension.     Palpations: Abdomen is soft. There is  no mass.     Tenderness: There is no abdominal tenderness.  Musculoskeletal:     Cervical back: Neck supple.  Lymphadenopathy:     Cervical: No cervical adenopathy.  Skin:    General: Skin is warm and dry.  Neurological:     Mental Status: She is alert and oriented to person, place, and time.  Psychiatric:        Behavior: Behavior normal.    BP 120/72   Pulse 68   Temp 98.3 F (36.8 C)   Resp 16   Wt 196 lb 12.8 oz (89.3 kg)   SpO2 97%   BMI 30.37 kg/m  Wt Readings from Last 3 Encounters:  08/04/20 196 lb 12.8 oz (89.3 kg)  01/29/20 202 lb 12.8 oz (92 kg)  03/08/19 211 lb 9.6 oz (96 kg)    Diabetic Foot Exam - Simple   No data filed    Lab Results  Component Value Date   WBC 8.4 01/29/2020   HGB 15.1 (H) 01/29/2020   HCT 44.5 01/29/2020   PLT 259.0 01/29/2020   GLUCOSE 163 (H) 05/02/2020   CHOL 136 05/02/2020   TRIG 80.0 05/02/2020   HDL 58.90 05/02/2020   LDLCALC 61 05/02/2020   ALT 15 05/02/2020   AST 12 05/02/2020   NA 138 05/02/2020   K 4.8 05/02/2020   CL 100 05/02/2020   CREATININE 0.75 05/02/2020   BUN 18 05/02/2020   CO2 30 05/02/2020   TSH 2.71 10/29/2019   HGBA1C 8.6 (H) 05/02/2020   MICROALBUR <0.7 08/22/2017    Lab Results  Component Value Date   TSH 2.71 10/29/2019   Lab Results  Component Value Date   WBC 8.4 01/29/2020   HGB 15.1 (H) 01/29/2020   HCT 44.5 01/29/2020   MCV 85.9 01/29/2020   PLT 259.0 01/29/2020   Lab Results  Component Value Date   NA 138 05/02/2020   K 4.8 05/02/2020   CO2 30 05/02/2020   GLUCOSE 163 (H) 05/02/2020   BUN 18  05/02/2020   CREATININE 0.75 05/02/2020   BILITOT 0.6 05/02/2020   ALKPHOS 90 05/02/2020   AST 12 05/02/2020   ALT 15 05/02/2020   PROT 6.3 05/02/2020   ALBUMIN 4.3 05/02/2020   CALCIUM 9.3 05/02/2020   GFR 83.06 05/02/2020   Lab Results  Component Value Date   CHOL 136 05/02/2020   Lab Results  Component Value Date   HDL 58.90 05/02/2020   Lab Results  Component Value Date   LDLCALC 61 05/02/2020   Lab Results  Component Value Date   TRIG 80.0 05/02/2020   Lab Results  Component Value Date   CHOLHDL 2 05/02/2020   Lab Results  Component Value Date   HGBA1C 8.6 (H) 05/02/2020       Assessment & Plan:   Problem List Items Addressed This Visit     Adjustment reaction    Very stressful with her husband having Parkinson's or PSP. She does not want any meds or counseling for now       Essential hypertension    Well controlled, no changes to meds. Encouraged heart healthy diet such as the DASH diet and exercise as tolerated.        Relevant Medications   rosuvastatin (CRESTOR) 5 MG tablet   Other Relevant Orders   CBC   Comprehensive metabolic panel   TSH   Hypothyroidism    On Levothyroxine, continue to monitor       DM type  2 (diabetes mellitus, type 2) (HCC)    hgba1c acceptable, minimize simple carbs. Increase exercise as tolerated. Continue current meds       Relevant Medications   rosuvastatin (CRESTOR) 5 MG tablet   Other Relevant Orders   Hemoglobin A1c   Hyperlipidemia with target LDL less than 100    Encourage heart healthy diet such as MIND or DASH diet, increase exercise, avoid trans fats, simple carbohydrates and processed foods, consider a krill or fish or flaxseed oil cap daily. Tolerating Crestor       Relevant Medications   rosuvastatin (CRESTOR) 5 MG tablet   Other Relevant Orders   Lipid panel   Vitamin D deficiency    Supplement and monitor       Relevant Orders   VITAMIN D 25 Hydroxy (Vit-D Deficiency, Fractures)     I have discontinued Elease Hashimoto C. Darnell's meloxicam, nitrofurantoin (macrocrystal-monohydrate), and phenazopyridine. I have also changed her rosuvastatin. Additionally, I am having her maintain her aspirin, calcium carbonate, Omega-3 Fatty Acids (FISH OIL PO), multivitamin, vitamin C, triamcinolone cream, diclofenac, meclizine, amLODipine, lisinopril, levothyroxine, and glipiZIDE.  Meds ordered this encounter  Medications   rosuvastatin (CRESTOR) 5 MG tablet    Sig: Take 1 tablet (5 mg total) by mouth daily.    Dispense:  90 tablet    Refill:  2     Danise Edge, MD

## 2020-08-05 ENCOUNTER — Other Ambulatory Visit: Payer: Self-pay

## 2020-08-05 MED ORDER — GLIPIZIDE ER 10 MG PO TB24: 10.0000 mg | ORAL_TABLET | Freq: Two times a day (BID) | ORAL | 1 refills | Status: DC

## 2020-10-02 ENCOUNTER — Telehealth: Payer: Self-pay | Admitting: Family Medicine

## 2020-10-02 NOTE — Telephone Encounter (Signed)
Left message for patient to call back and schedule Medicare Annual Wellness Visit (AWV) in office.   If not able to come in office, please offer to do virtually or by telephone.  Left office number and my jabber #[phone].  Due for AWVI  Please schedule at anytime with Nurse Health Advisor.   

## 2020-10-06 ENCOUNTER — Other Ambulatory Visit: Payer: Self-pay | Admitting: Family Medicine

## 2020-10-16 ENCOUNTER — Ambulatory Visit (INDEPENDENT_AMBULATORY_CARE_PROVIDER_SITE_OTHER): Payer: Medicare Other

## 2020-10-16 VITALS — Ht 67.5 in | Wt 196.0 lb

## 2020-10-16 DIAGNOSIS — Z0000 Encounter for general adult medical examination without abnormal findings: Secondary | ICD-10-CM | POA: Diagnosis not present

## 2020-10-16 NOTE — Progress Notes (Signed)
Subjective:   [name] [name] is a 65 y.o. female who presents for an Initial Medicare Annual Wellness Visit.  I connected with [name] [name] today by telephone and verified that I am speaking with the correct person using two identifiers. Location patient: home Location provider: work Persons participating in the virtual visit: patient, Engineer, civil (consulting).    I discussed the limitations, risks, security and privacy concerns of performing an evaluation and management service by telephone and the availability of in person appointments. I also discussed with the patient that there may be a patient responsible charge related to this service. The patient expressed understanding and verbally consented to this telephonic visit.    Interactive audio and video telecommunications were attempted between this provider and patient, however failed, due to patient having technical difficulties OR patient did not have access to video capability.  We continued and completed visit with audio only.  Some vital signs may be absent or patient reported.   Time Spent with patient on telephone encounter: 20 minutes   Review of Systems     Cardiac Risk Factors include: advanced age (>11men, >46 women);diabetes mellitus;dyslipidemia;hypertension;obesity (BMI >30kg/m2)     Objective:    Today's Vitals   10/16/20 0820  Weight: 196 lb (88.9 kg)  Height: 5' 7.5" (1.715 m)   Body mass index is 30.24 kg/m.  Advanced Directives 10/16/2020 10/29/2011 03/01/2011  Does Patient Have a Medical Advance Directive? Yes Patient has advance directive, copy not in chart Patient has advance directive, copy not in chart  Type of Advance Directive Healthcare Power of [place];Living will - -  Copy of Healthcare Power of Attorney in Chart? No - copy requested - -    Current Medications (verified) Outpatient Encounter Medications as of 10/16/2020  Medication Sig   amLODipine (NORVASC) 5 MG tablet TAKE 1 TABLET BY MOUTH DAILY   Ascorbic Acid  (VITAMIN C) 1000 MG tablet Take 1,000 mg by mouth daily.   aspirin 81 MG EC tablet Take 81 mg by mouth daily.   calcium carbonate (OS-CAL - DOSED IN MG OF ELEMENTAL CALCIUM) 1250 (500 Ca) MG tablet Take 1 tablet by mouth daily.   diclofenac (VOLTAREN) 75 MG EC tablet Take 1 tablet (75 mg total) by mouth 2 (two) times daily as needed.   glipiZIDE (GLUCOTROL XL) 10 MG 24 hr tablet TAKE 1 TABLET BY MOUTH EVERY DAY WITH BREAKFAST. STOP GLIPIZIDE ER  RX   levothyroxine (SYNTHROID) 75 MCG tablet TAKE 1 TABLET BY MOUTH EVERY DAY BEFORE BREAKFAST   lisinopril (ZESTRIL) 2.5 MG tablet TAKE 1 TABLET BY MOUTH DAILY. GENERIC EQUIVALENT FOR ZESTRIL   meclizine (ANTIVERT) 25 MG tablet Take 1 tablet (25 mg total) by mouth 3 (three) times daily as needed for dizziness.   Multiple Vitamin (MULTIVITAMIN) tablet Take 1 tablet by mouth daily.   Omega-3 Fatty Acids (FISH OIL PO) Take by mouth daily.   rosuvastatin (CRESTOR) 5 MG tablet Take 1 tablet (5 mg total) by mouth daily.   triamcinolone cream (KENALOG) 0.1 % Apply 1 application topically 2 (two) times daily.   No facility-administered encounter medications on file as of 10/16/2020.    Allergies (verified) Codeine and Tramadol   History: Past Medical History:  Diagnosis Date   Back pain 06/13/2015   Diabetes mellitus    started Metformin 12/2012   History of shingles 2012   back   Hyperlipidemia    Hypertension    Hypothyroidism 07/29/2010   S/p FNA showing non-neoplastic goiter 7/12 w/ Dr Pollyann Kennedy  Obesity 08/06/2016   Osteoporosis 08/06/2016   Vitamin D deficiency 08/06/2016   Past Surgical History:  Procedure Laterality Date   ABDOMINAL HYSTERECTOMY  1997   APPENDECTOMY     MASS EXCISION  03/02/2011   Procedure: EXCISION MASS;  Surgeon: Nicki Reaper, MD;  Location: [facility];  Service: Orthopedics;  Laterality: Right;  Excision Cyst Interphangeal Right Thumb   OOPHORECTOMY     SHOULDER ARTHROSCOPY W/ ROTATOR CUFF REPAIR  2007    rt   TONSILLECTOMY     TRIGGER FINGER RELEASE  11/01/2011   Procedure: RELEASE TRIGGER FINGER/A-1 PULLEY;  Surgeon: Nicki Reaper, MD;  Location: [facility];  Service: Orthopedics;  Laterality: Right;  right thumb, possible excision cyst   Family History  Problem Relation Age of Onset   Hypertension Mother    Diabetes Mother    Breast cancer Mother 18   Other Mother    Hypertension Father    Cancer Father        throat   Alcohol abuse Father    Hypertension Brother    Heart disease Brother        heart murmur   Alcohol abuse Brother    Cancer Maternal Grandmother    Heart disease Maternal Grandfather    Heart disease Paternal Grandmother    Cancer Maternal Aunt    Breast cancer Maternal Aunt    Social History   Socioeconomic History   Marital status: Married    Spouse name: Not on file   Number of children: Not on file   Years of education: Not on file   Highest education level: Not on file  Occupational History   Occupation: Physiological scientist  Tobacco Use   Smoking status: Never   Smokeless tobacco: Never  Substance and Sexual Activity   Alcohol use: No   Drug use: No   Sexual activity: Yes    Partners: Male    Comment: lives with husband, works at Production designer, theatre/television/film, no major dietary   Other Topics Concern   Not on file  Social History Narrative   Married; step daughter   Regular exercise: walk 2 days a week   Caffeine use: 2 cups of coffee daily   Social Determinants of Health   Financial Resource Strain: Low Risk    Difficulty of Paying Living Expenses: Not hard at all  Food Insecurity: No Food Insecurity   Worried About Programme researcher, broadcasting/film/video in the Last Year: Never true   Barista in the Last Year: Never true  Transportation Needs: No Transportation Needs   Lack of Transportation (Medical): No   Lack of Transportation (Non-Medical): No  Physical Activity: Inactive   Days of Exercise per Week: 0 days   Minutes of Exercise per  Session: 0 min  Stress: No Stress Concern Present   Feeling of Stress : Not at all  Social Connections: Moderately Integrated   Frequency of Communication with Friends and Family: More than three times a week   Frequency of Social Gatherings with Friends and Family: More than three times a week   Attends Religious Services: More than 4 times per year   Active Member of [employer] or Organizations: No   Attends Banker Meetings: Never   Marital Status: Married    Tobacco Counseling Counseling given: Not Answered   Clinical Intake:  Pre-visit preparation completed: Yes  Pain : No/denies pain     BMI - recorded: 30.24 Nutritional Status:  BMI > 30  Obese Nutritional Risks: None Diabetes: Yes CBG done?: No Did pt. bring in CBG monitor from home?: No (phone visit)  How often do you need to have someone help you when you read instructions, pamphlets, or other written materials from your doctor or pharmacy?: 1 - Never  Diabetes:  Is the patient diabetic?  Yes  If diabetic, was a CBG obtained today?  No  Did the patient bring in their glucometer from home?  No phone visit How often do you monitor your CBG's? never.   Financial Strains and Diabetes Management:  Are you having any financial strains with the device, your supplies or your medication? No .  Does the patient want to be seen by Chronic Care Management for management of their diabetes?  No  Would the patient like to be referred to a Nutritionist or for Diabetic Management?  No   Diabetic Exams:  Diabetic Eye Exam: Completed 04/25/2020.   Diabetic Foot Exam:  Pt has been advised about the importance in completing this exam. . To be completed by PCP.   Interpreter Needed?: No  Information entered by :: Thomasenia SalesMartha Romey Cohea LPN   Activities of Daily Living In your present state of health, do you have any difficulty performing the following activities: 10/16/2020 08/04/2020  Hearing? N N  Vision? N N   Difficulty concentrating or making decisions? N N  Walking or climbing stairs? N N  Dressing or bathing? N N  Doing errands, shopping? N N  Preparing Food and eating ? N -  Using the Toilet? N -  In the past six months, have you accidently leaked urine? N -  Do you have problems with loss of bowel control? N -  Managing your Medications? N -  Managing your Finances? N -  Housekeeping or managing your Housekeeping? N -  Some recent data might be hidden    Patient Care Team: Bradd CanaryBlyth, Stacey A, MD as PCP - General (Family Medicine) Dorena CookeyHayes, John, MD (Inactive) as Consulting Physician (Gastroenterology) Arminda ResidesJones, Daniel, MD as Consulting Physician (Dermatology)  Indicate any recent Medical Services you may have received from other than [facility] providers in the past year (date may be approximate).     Assessment:   This is a routine wellness examination for [name] Hashimotoatricia.  Hearing/Vision screen Hearing Screening - Comments:: No issues Vision Screening - Comments:: Last eye exam-04/2020-Dr. Nile RiggsShapiro  Dietary issues and exercise activities discussed: Current Exercise Habits: The patient does not participate in regular exercise at present, Exercise limited by: None identified   Goals Addressed             This Visit's Progress    Patient Stated       Maintain current health       Depression Screen PHQ 2/9 Scores 10/16/2020 08/04/2020 03/09/2019 02/21/2017 10/12/2013 10/03/2012  PHQ - 2 Score 0 0 0 0 0 0  PHQ- 9 Score - 0 0 - - -    Fall Risk Fall Risk  10/16/2020 08/04/2020 01/29/2020 02/21/2017 10/12/2013  Falls in the past year? 0 1 0 No No  Number falls in past yr: 0 1 0 - -  Injury with Fall? 0 0 0 - -  Follow up Falls prevention discussed - - - -    FALL RISK PREVENTION PERTAINING TO THE HOME:  Any stairs in or around the home? Yes  If so, are there any without handrails? No  Home free of loose throw rugs in walkways, pet beds, electrical  cords, etc? Yes  Adequate lighting in your home  to reduce risk of falls? Yes   ASSISTIVE DEVICES UTILIZED TO PREVENT FALLS:  Life alert? No  Use of a cane, walker or w/c? No  Grab bars in the bathroom? Yes  Shower chair or bench in shower? No  Elevated toilet seat or a handicapped toilet? No   TIMED UP AND GO:  Was the test performed? No . Phone visit   Cognitive Function:Normal cognitive status assessed by  this Nurse Health Advisor. No abnormalities found.          Immunizations Immunization History  Administered Date(s) Administered   Influenza,inj,Quad PF,6+ Mos 12/05/2012, 12/13/2013, 03/11/2015, 12/15/2017   Influenza,inj,quad, With Preservative 12/20/2016, 11/22/2018   Influenza-Unspecified 12/20/2016, 12/31/2019   PFIZER(Purple Top)SARS-COV-2 Vaccination 03/27/2019, 04/21/2019, 01/30/2020   Pneumococcal Conjugate-13 06/13/2015   Pneumococcal Polysaccharide-23 02/15/2009, 01/29/2020   Td 02/16/2007   Tdap 02/21/2017   Zoster Recombinat (Shingrix) 02/21/2017, 08/22/2017   Zoster, Live 06/13/2015    TDAP status: Up to date  Flu Vaccine status: Due, Education has been provided regarding the importance of this vaccine. Advised may receive this vaccine at local pharmacy or Health Dept. Aware to provide a copy of the vaccination record if obtained from local pharmacy or Health Dept. Verbalized acceptance and understanding.  Pneumococcal vaccine status: Up to date  Covid-19 vaccine status: Information provided on how to obtain vaccines. Booster due  Qualifies for Shingles Vaccine? No   Zostavax completed Yes   Shingrix Completed?: Yes  Screening Tests Health Maintenance  Topic Date Due   FOOT EXAM  09/10/2015   COVID-19 Vaccine (4 - Booster for Pfizer series) 05/30/2020   INFLUENZA VACCINE  09/15/2020   HEMOGLOBIN A1C  02/03/2021   OPHTHALMOLOGY EXAM  04/25/2021   MAMMOGRAM  11/04/2021   COLONOSCOPY (Pts 45-55yrs Insurance coverage will need to be confirmed)  06/24/2024   TETANUS/TDAP  02/22/2027   DEXA  SCAN  Completed   Hepatitis C Screening  Completed   PNA vac Low Risk Adult  Completed   Zoster Vaccines- Shingrix  Completed   HPV VACCINES  Aged Out    Health Maintenance  Health Maintenance Due  Topic Date Due   FOOT EXAM  09/10/2015   COVID-19 Vaccine (4 - Booster for Pfizer series) 05/30/2020   INFLUENZA VACCINE  09/15/2020    Colorectal cancer screening: Type of screening: Colonoscopy. Completed 06/25/2019. Repeat every 5 years  Mammogram status: Completed Bilateral 11/04/2020. Repeat every year  Bone Density status: Completed 03/18/2020. Results reflect: Bone density results: OSTEOPOROSIS. Repeat every 2 years.  Lung Cancer Screening: (Low Dose CT Chest recommended if Age 24-80 years, 30 pack-year currently smoking OR have quit w/in 15years.) does not qualify.     Additional Screening:  Hepatitis C Screening: Completed 03/11/2015  Vision Screening: Recommended annual ophthalmology exams for early detection of glaucoma and other disorders of the eye. Is the patient up to date with their annual eye exam?  Yes  Who is the provider or what is the name of the office in which the patient attends annual eye exams? Dr Nile Riggs   Dental Screening: Recommended annual dental exams for proper oral hygiene  Community Resource Referral / Chronic Care Management: CRR required this visit?  No   CCM required this visit?  No      Plan:     I have personally reviewed and noted the following in the patient's chart:   Medical and social history Use of alcohol, tobacco or illicit  drugs  Current medications and supplements including opioid prescriptions. Patient is not currently taking opioid prescriptions. Functional ability and status Nutritional status Physical activity Advanced directives List of other physicians Hospitalizations, surgeries, and ER visits in previous 12 months Vitals Screenings to include cognitive, depression, and falls Referrals and appointments  In  addition, I have reviewed and discussed with patient certain preventive protocols, quality metrics, and best practice recommendations. A written personalized care plan for preventive services as well as general preventive health recommendations were provided to patient.   Due to this being a telephonic visit, the after visit summary with patients personalized plan was offered to patient via mail or my-chart. Patient would like to access on my-chart.   Roanna Raider, LPN   07/22/2092  Nurse Health Advisor  Nurse Notes: None

## 2020-10-16 NOTE — Patient Instructions (Signed)
[name] [name] , Thank you for taking time to complete your Medicare Wellness Visit. I appreciate your ongoing commitment to your health goals. Please review the following plan we discussed and let me know if I can assist you in the future.   Screening recommendations/referrals: Colonoscopy: Completed 06/25/2019-Due 06/24/2024 Mammogram: Completed 11/05/2019-Due 11/04/2020 Bone Density: Completed 03/18/2020-Due 03/18/2022 Recommended yearly ophthalmology/optometry visit for glaucoma screening and checkup Recommended yearly dental visit for hygiene and checkup  Vaccinations: Influenza vaccine: Due 10/2020 Pneumococcal vaccine: Up to date Tdap vaccine: Up to date-Due-02/22/2027 Shingles vaccine: Completed vaccines   Covid-19:Booster due  Advanced directives: Please bring a copy for your chart  Conditions/risks identified: See problem list  Next appointment: Follow up in one year for your annual wellness visit 10/20/2021 @ 8:20   Preventive Care 67 Years and Older, Female Preventive care refers to lifestyle choices and visits with your health care provider that can promote health and wellness. What does preventive care include? A yearly physical exam. This is also called an annual well check. Dental exams once or twice a year. Routine eye exams. Ask your health care provider how often you should have your eyes checked. Personal lifestyle choices, including: Daily care of your teeth and gums. Regular physical activity. Eating a healthy diet. Avoiding tobacco and drug use. Limiting alcohol use. Practicing safe sex. Taking low-dose aspirin every day. Taking vitamin and mineral supplements as recommended by your health care provider. What happens during an annual well check? The services and screenings done by your health care provider during your annual well check will depend on your age, overall health, lifestyle risk factors, and family history of disease. Counseling  Your health care provider  may ask you questions about your: Alcohol use. Tobacco use. Drug use. Emotional well-being. Home and relationship well-being. Sexual activity. Eating habits. History of falls. Memory and ability to understand (cognition). Work and work Astronomer. Reproductive health. Screening  You may have the following tests or measurements: Height, weight, and BMI. Blood pressure. Lipid and cholesterol levels. These may be checked every 5 years, or more frequently if you are over 30 years old. Skin check. Lung cancer screening. You may have this screening every year starting at age 67 if you have a 30-pack-year history of smoking and currently smoke or have quit within the past 15 years. Fecal occult blood test (FOBT) of the stool. You may have this test every year starting at age 67. Flexible sigmoidoscopy or colonoscopy. You may have a sigmoidoscopy every 5 years or a colonoscopy every 10 years starting at age 67. Hepatitis C blood test. Hepatitis B blood test. Sexually transmitted disease (STD) testing. Diabetes screening. This is done by checking your blood sugar (glucose) after you have not eaten for a while (fasting). You may have this done every 1-3 years. Bone density scan. This is done to screen for osteoporosis. You may have this done starting at age 67. Mammogram. This may be done every 1-2 years. Talk to your health care provider about how often you should have regular mammograms. Talk with your health care provider about your test results, treatment options, and if necessary, the need for more tests. Vaccines  Your health care provider may recommend certain vaccines, such as: Influenza vaccine. This is recommended every year. Tetanus, diphtheria, and acellular pertussis (Tdap, Td) vaccine. You may need a Td booster every 10 years. Zoster vaccine. You may need this after age 67. Pneumococcal 13-valent conjugate (PCV13) vaccine. One dose is recommended after age 67. Pneumococcal  polysaccharide (PPSV23) vaccine. One dose is recommended after age 67. Talk to your health care provider about which screenings and vaccines you need and how often you need them. This information is not intended to replace advice given to you by your health care provider. Make sure you discuss any questions you have with your health care provider. Document Released: 02/28/2015 Document Revised: 10/22/2015 Document Reviewed: 12/03/2014 Elsevier Interactive Patient Education  2017 [employer] Prevention in the Home Falls can cause injuries. They can happen to people of all ages. There are many things you can do to make your home safe and to help prevent falls. What can I do on the outside of my home? Regularly fix the edges of walkways and driveways and fix any cracks. Remove anything that might make you trip as you walk through a door, such as a raised step or threshold. Trim any bushes or trees on the path to your home. Use bright outdoor lighting. Clear any walking paths of anything that might make someone trip, such as rocks or tools. Regularly check to see if handrails are loose or broken. Make sure that both sides of any steps have handrails. Any raised decks and porches should have guardrails on the edges. Have any leaves, snow, or ice cleared regularly. Use sand or salt on walking paths during winter. Clean up any spills in your garage right away. This includes oil or grease spills. What can I do in the bathroom? Use night lights. Install grab bars by the toilet and in the tub and shower. Do not use towel bars as grab bars. Use non-skid mats or decals in the tub or shower. If you need to sit down in the shower, use a plastic, non-slip stool. Keep the floor dry. Clean up any water that spills on the floor as soon as it happens. Remove soap buildup in the tub or shower regularly. Attach bath mats securely with double-sided non-slip rug tape. Do not have throw rugs and other  things on the floor that can make you trip. What can I do in the bedroom? Use night lights. Make sure that you have a light by your bed that is easy to reach. Do not use any sheets or blankets that are too big for your bed. They should not hang down onto the floor. Have a firm chair that has side arms. You can use this for support while you get dressed. Do not have throw rugs and other things on the floor that can make you trip. What can I do in the kitchen? Clean up any spills right away. Avoid walking on wet floors. Keep items that you use a lot in easy-to-reach places. If you need to reach something above you, use a strong step stool that has a grab bar. Keep electrical cords out of the way. Do not use floor polish or wax that makes floors slippery. If you must use wax, use non-skid floor wax. Do not have throw rugs and other things on the floor that can make you trip. What can I do with my stairs? Do not leave any items on the stairs. Make sure that there are handrails on both sides of the stairs and use them. Fix handrails that are broken or loose. Make sure that handrails are as long as the stairways. Check any carpeting to make sure that it is firmly attached to the stairs. Fix any carpet that is loose or worn. Avoid having throw rugs at the top or  bottom of the stairs. If you do have throw rugs, attach them to the floor with carpet tape. Make sure that you have a light switch at the top of the stairs and the bottom of the stairs. If you do not have them, ask someone to add them for you. What else can I do to help prevent falls? Wear shoes that: Do not have high heels. Have rubber bottoms. Are comfortable and fit you well. Are closed at the toe. Do not wear sandals. If you use a stepladder: Make sure that it is fully opened. Do not climb a closed stepladder. Make sure that both sides of the stepladder are locked into place. Ask someone to hold it for you, if possible. Clearly  mark and make sure that you can see: Any grab bars or handrails. First and last steps. Where the edge of each step is. Use tools that help you move around (mobility aids) if they are needed. These include: Canes. Walkers. Scooters. Crutches. Turn on the lights when you go into a dark area. Replace any light bulbs as soon as they burn out. Set up your furniture so you have a clear path. Avoid moving your furniture around. If any of your floors are uneven, fix them. If there are any pets around you, be aware of where they are. Review your medicines with your doctor. Some medicines can make you feel dizzy. This can increase your chance of falling. Ask your doctor what other things that you can do to help prevent falls. This information is not intended to replace advice given to you by your health care provider. Make sure you discuss any questions you have with your health care provider. Document Released: 11/28/2008 Document Revised: 07/10/2015 Document Reviewed: 03/08/2014 Elsevier Interactive Patient Education  2017 [employer].

## 2020-10-19 IMAGING — MG MM DIGITAL SCREENING BILAT W/ TOMO W/ CAD
8 series · 8 of 24 positions shown · non-contrast
Comparison: Previous exam(s).

CLINICAL DATA: Screening.

EXAM:
DIGITAL SCREENING BILATERAL MAMMOGRAM WITH TOMO AND CAD

[L CC synth-2D]
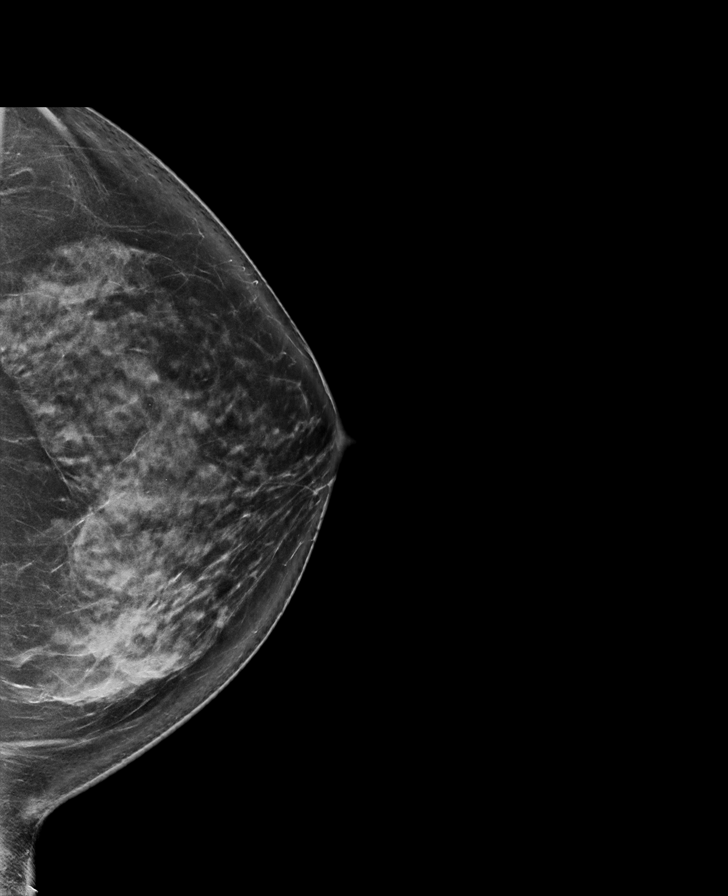

[R MLO synth-2D]
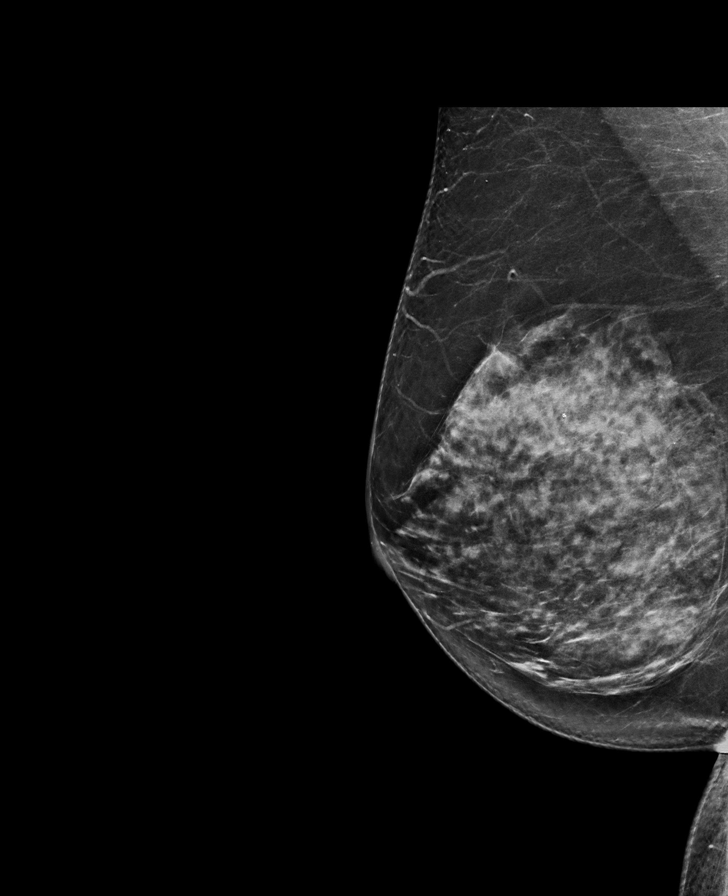

[L MLO synth-2D]
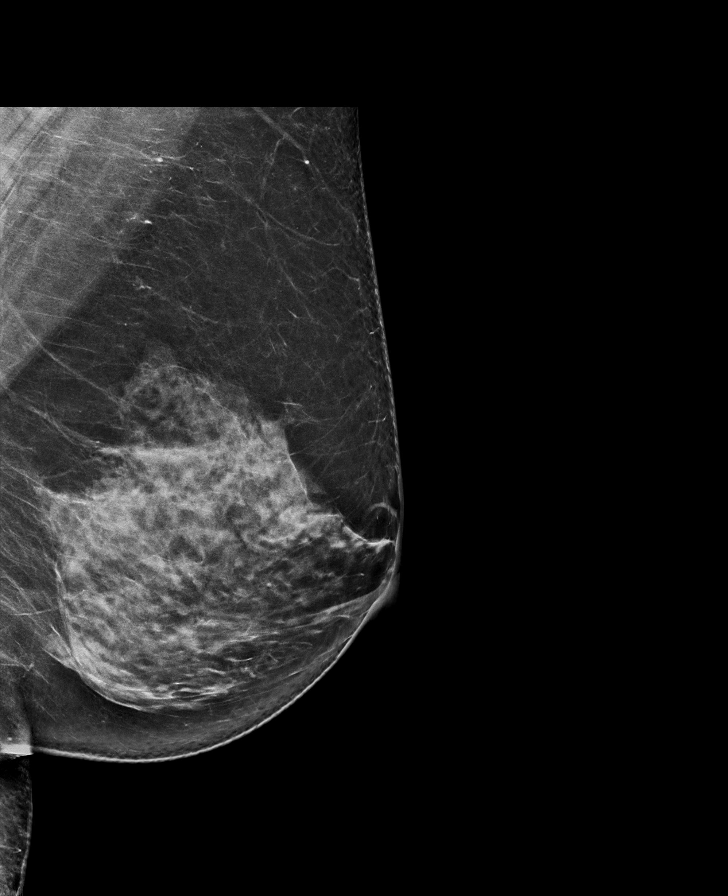

[R CC synth-2D]
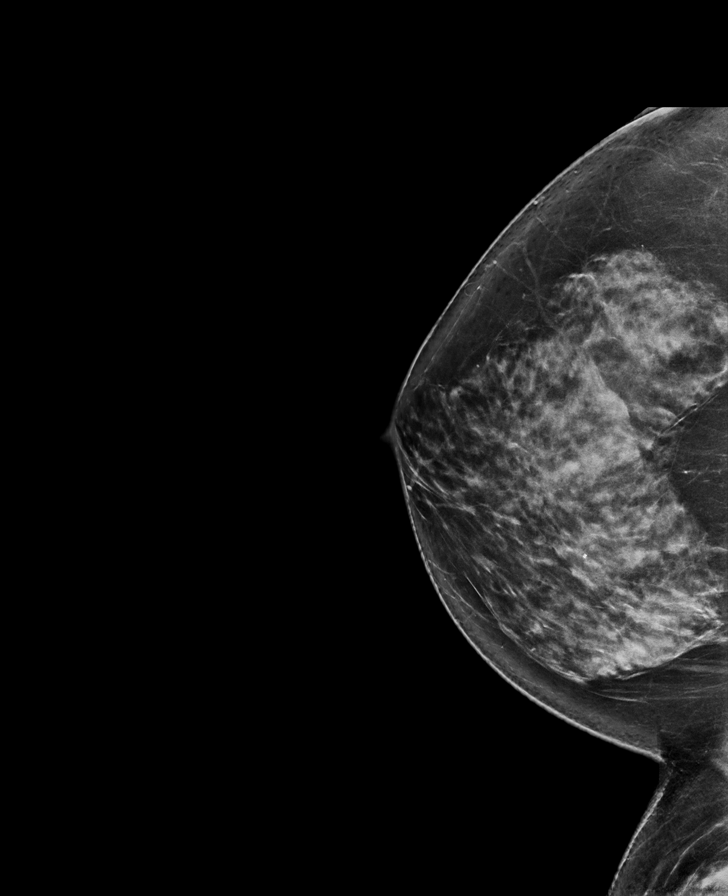

[R MLO tomo · tomo slice 41/81.0]
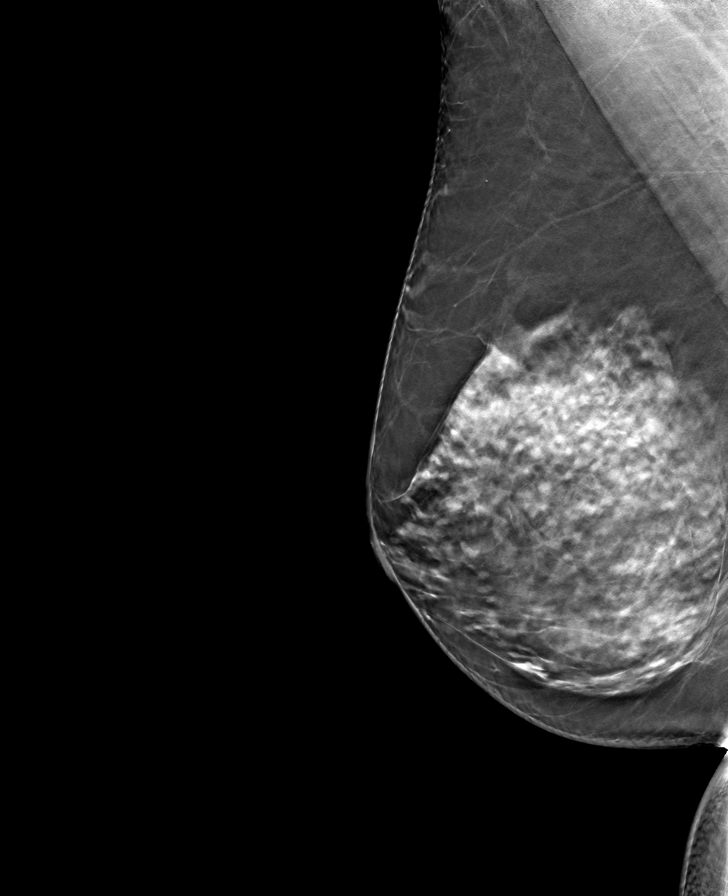

[L CC tomo · tomo slice 45/88.0]
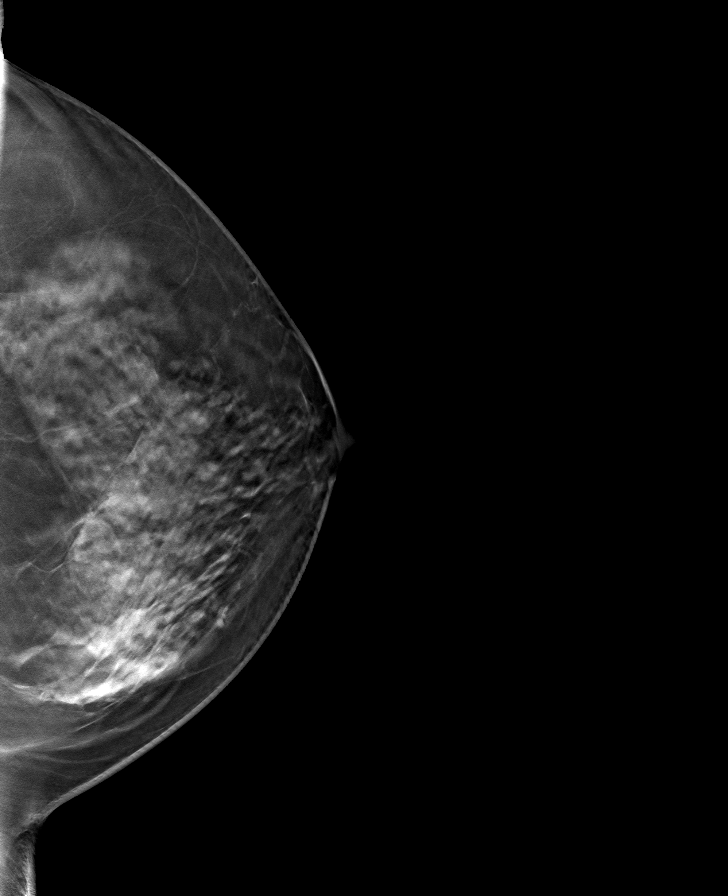

[L MLO tomo · tomo slice 43/86.0]
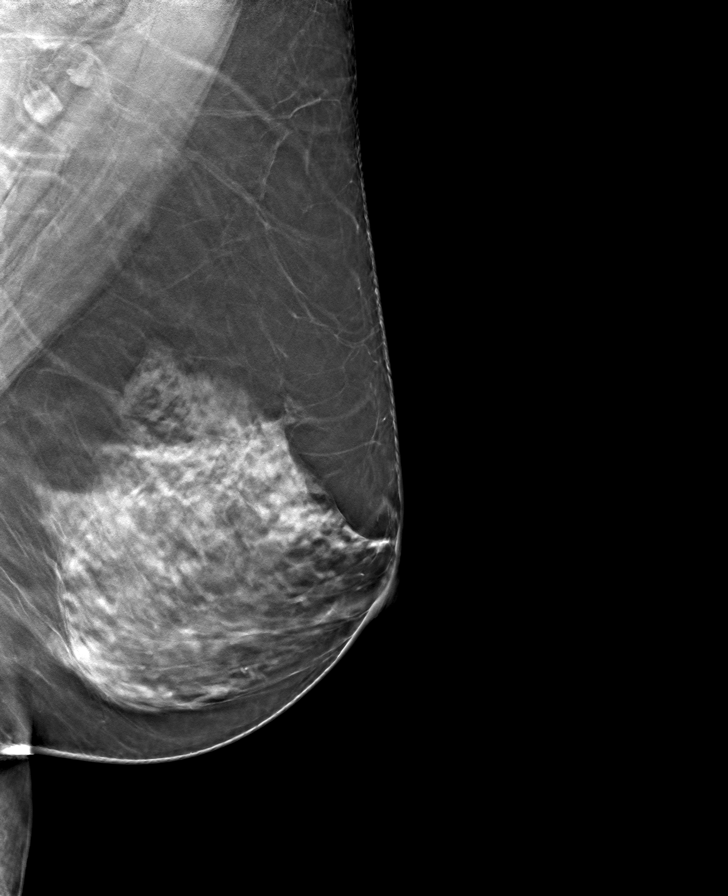

[R CC tomo · tomo slice 44/87.0]
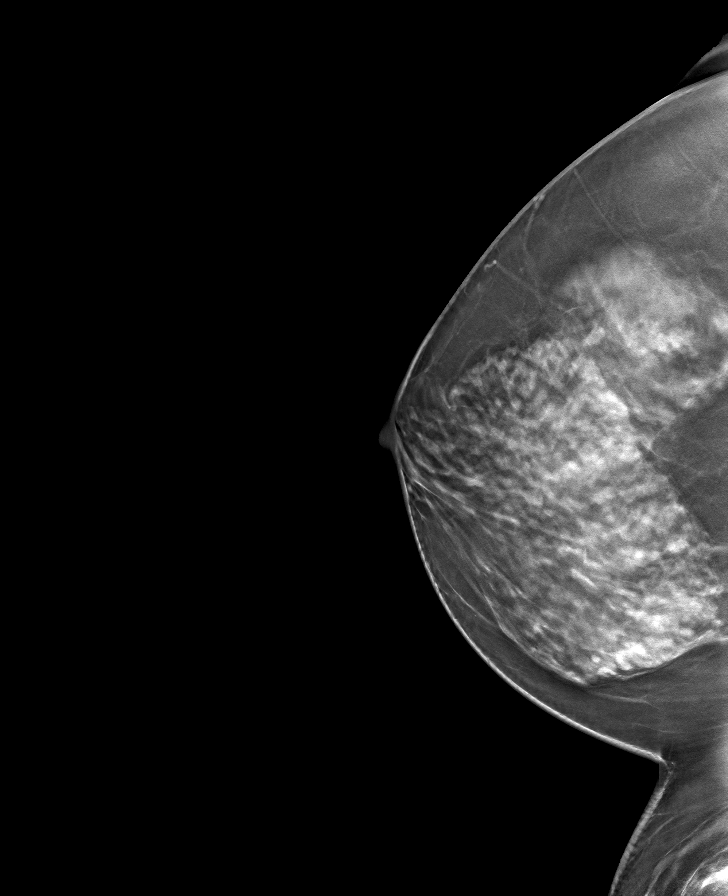

[8 of 24 positions shown; findings below may reference images not displayed]

ACR Breast Density Category c: The breast tissue is heterogeneously
dense, which may obscure small masses.
FINDINGS: There are no findings suspicious for malignancy. Images were
processed with CAD.
IMPRESSION: No mammographic evidence of malignancy. A result letter of this
screening mammogram will be mailed directly to the patient.

RECOMMENDATION:
Screening mammogram in one year. (Code:FT-[id number])

BI-RADS CATEGORY  1: Negative.

## 2020-10-27 ENCOUNTER — Other Ambulatory Visit: Payer: Self-pay | Admitting: Family Medicine

## 2020-10-27 DIAGNOSIS — Z1231 Encounter for screening mammogram for malignant neoplasm of breast: Secondary | ICD-10-CM

## 2020-12-01 ENCOUNTER — Other Ambulatory Visit: Payer: Self-pay

## 2020-12-01 ENCOUNTER — Ambulatory Visit (INDEPENDENT_AMBULATORY_CARE_PROVIDER_SITE_OTHER): Payer: Medicare Other | Admitting: Medical

## 2020-12-01 VITALS — BP 134/59 | HR 76 | Temp 98.3°F | Resp 18 | Ht 67.5 in | Wt 193.4 lb

## 2020-12-01 DIAGNOSIS — E119 Type 2 diabetes mellitus without complications: Secondary | ICD-10-CM

## 2020-12-01 DIAGNOSIS — L089 Local infection of the skin and subcutaneous tissue, unspecified: Secondary | ICD-10-CM | POA: Diagnosis not present

## 2020-12-01 LAB — CBC WITH DIFFERENTIAL/PLATELET
Basophils Absolute: 0.1 10*3/uL (ref 0.0–0.1)
Basophils Relative: 0.5 % (ref 0.0–3.0)
Eosinophils Absolute: 0.2 10*3/uL (ref 0.0–0.7)
Eosinophils Relative: 2.1 % (ref 0.0–5.0)
HCT: 43.9 % (ref 36.0–46.0)
Hemoglobin: 14.5 g/dL (ref 12.0–15.0)
Lymphocytes Relative: 15.7 % (ref 12.0–46.0)
Lymphs Abs: 1.6 10*3/uL (ref 0.7–4.0)
MCHC: 33.1 g/dL (ref 30.0–36.0)
MCV: 86.6 fl (ref 78.0–100.0)
Monocytes Absolute: 0.8 10*3/uL (ref 0.1–1.0)
Monocytes Relative: 7.6 % (ref 3.0–12.0)
Neutro Abs: 7.6 10*3/uL (ref 1.4–7.7)
Neutrophils Relative %: 74.1 % (ref 43.0–77.0)
Platelets: 244 10*3/uL (ref 150.0–400.0)
RBC: 5.07 Mil/uL (ref 3.87–5.11)
RDW: 12.8 % (ref 11.5–15.5)
WBC: 10.2 10*3/uL (ref 4.0–10.5)

## 2020-12-01 LAB — HEMOGLOBIN A1C: Hgb A1c MFr Bld: 8.3 % — ABNORMAL HIGH (ref 4.6–6.5)

## 2020-12-01 LAB — COMPREHENSIVE METABOLIC PANEL
ALT: 13 U/L (ref 0–35)
AST: 12 U/L (ref 0–37)
Albumin: 4.6 g/dL (ref 3.5–5.2)
Alkaline Phosphatase: 95 U/L (ref 39–117)
BUN: 12 mg/dL (ref 6–23)
CO2: 27 mEq/L (ref 19–32)
Calcium: 9.7 mg/dL (ref 8.4–10.5)
Chloride: 102 mEq/L (ref 96–112)
Creatinine, Ser: 0.76 mg/dL (ref 0.40–1.20)
GFR: 81.42 mL/min (ref >60.00)
Glucose, Bld: 288 mg/dL — ABNORMAL HIGH (ref 70–99)
Potassium: 4.8 mEq/L (ref 3.5–5.1)
Sodium: 137 mEq/L (ref 135–145)
Total Bilirubin: 0.5 mg/dL (ref 0.2–1.2)
Total Protein: 6.8 g/dL (ref 6.0–8.3)

## 2020-12-01 MED ORDER — CEFTRIAXONE SODIUM 1 G IJ SOLR
1.0000 g | Freq: Once | INTRAMUSCULAR | Status: AC
Start: 2020-12-01 — End: 2020-12-01
  Administered 2020-12-01: 1 g via INTRAMUSCULAR

## 2020-12-01 MED ORDER — METFORMIN HCL 500 MG PO TABS: 500.0000 mg | ORAL_TABLET | Freq: Every day | ORAL | 3 refills | Status: DC

## 2020-12-01 MED ORDER — DOXYCYCLINE HYCLATE 100 MG PO TABS: 100.0000 mg | ORAL_TABLET | Freq: Two times a day (BID) | ORAL | 0 refills | Status: DC

## 2020-12-01 NOTE — Progress Notes (Signed)
Subjective:    Patient ID: [name] [name], female    DOB: November 02, 1955, 65 y.o.   MRN: [id number]  HPI  Pt states last Tuesday she had slight pain and swelling to left upper arm past Tuesday. Pt states she squeezed are and got some discharge area that smelled. The area has gotten larger and redder over past week. Area felt warmer last week.   No insect bite, no vaccine or any injury.    Review of Systems  Constitutional:  Negative for chills, fatigue and fever.  Eyes:  Negative for photophobia and pain.  Respiratory:  Negative for cough, choking, shortness of breath and wheezing.   Cardiovascular:  Negative for chest pain and palpitations.  Gastrointestinal:  Negative for abdominal pain.  Skin:  Positive for rash.       Left upper arm- red swollen and tender area.    Past Medical History:  Diagnosis Date   Back pain 06/13/2015   Diabetes mellitus    started Metformin 12/2012   History of shingles 2012   back   Hyperlipidemia    Hypertension    Hypothyroidism 07/29/2010   S/p FNA showing non-neoplastic goiter 7/12 w/ Dr Pollyann Kennedy    Obesity 08/06/2016   Osteoporosis 08/06/2016   Vitamin D deficiency 08/06/2016     Social History   Socioeconomic History   Marital status: Married    Spouse name: Not on file   Number of children: Not on file   Years of education: Not on file   Highest education level: Not on file  Occupational History   Occupation: Physiological scientist  Tobacco Use   Smoking status: Never   Smokeless tobacco: Never  Substance and Sexual Activity   Alcohol use: No   Drug use: No   Sexual activity: Yes    Partners: Male    Comment: lives with husband, works at [employer], no major dietary   Other Topics Concern   Not on file  Social History Narrative   Married; step daughter   Regular exercise: walk 2 days a week   Caffeine use: 2 cups of coffee daily   Social Determinants of Health   Financial Resource Strain: Low Risk    Difficulty of Paying  Living Expenses: Not hard at all  Food Insecurity: No Food Insecurity   Worried About Programme researcher, broadcasting/film/video in the Last Year: Never true   Barista in the Last Year: Never true  Transportation Needs: No Transportation Needs   Lack of Transportation (Medical): No   Lack of Transportation (Non-Medical): No  Physical Activity: Inactive   Days of Exercise per Week: 0 days   Minutes of Exercise per Session: 0 min  Stress: No Stress Concern Present   Feeling of Stress : Not at all  Social Connections: Moderately Integrated   Frequency of Communication with Friends and Family: More than three times a week   Frequency of Social Gatherings with Friends and Family: More than three times a week   Attends Religious Services: More than 4 times per year   Active Member of [employer] or Organizations: No   Attends Banker Meetings: Never   Marital Status: Married  Catering manager Violence: Not At Risk   Fear of Current or Ex-Partner: No   Emotionally Abused: No   Physically Abused: No   Sexually Abused: No    Past Surgical History:  Procedure Laterality Date   ABDOMINAL HYSTERECTOMY  1997   APPENDECTOMY  MASS EXCISION  03/02/2011   Procedure: EXCISION MASS;  Surgeon: Nicki Reaper, MD;  Location: [facility];  Service: Orthopedics;  Laterality: Right;  Excision Cyst Interphangeal Right Thumb   OOPHORECTOMY     SHOULDER ARTHROSCOPY W/ ROTATOR CUFF REPAIR  2007   rt   TONSILLECTOMY     TRIGGER FINGER RELEASE  11/01/2011   Procedure: RELEASE TRIGGER FINGER/A-1 PULLEY;  Surgeon: Nicki Reaper, MD;  Location: [facility];  Service: Orthopedics;  Laterality: Right;  right thumb, possible excision cyst    Family History  Problem Relation Age of Onset   Hypertension Mother    Diabetes Mother    Breast cancer Mother 37   Other Mother    Hypertension Father    Cancer Father        throat   Alcohol abuse Father    Hypertension Brother    Heart disease  Brother        heart murmur   Alcohol abuse Brother    Cancer Maternal Grandmother    Heart disease Maternal Grandfather    Heart disease Paternal Grandmother    Cancer Maternal Aunt    Breast cancer Maternal Aunt     Allergies  Allergen Reactions   Codeine    Tramadol     Current Outpatient Medications on File Prior to Visit  Medication Sig Dispense Refill   amLODipine (NORVASC) 5 MG tablet TAKE 1 TABLET BY MOUTH DAILY 90 tablet 1   Ascorbic Acid (VITAMIN C) 1000 MG tablet Take 1,000 mg by mouth daily.     aspirin 81 MG EC tablet Take 81 mg by mouth daily.     calcium carbonate (OS-CAL - DOSED IN MG OF ELEMENTAL CALCIUM) 1250 (500 Ca) MG tablet Take 1 tablet by mouth daily.     diclofenac (VOLTAREN) 75 MG EC tablet Take 1 tablet (75 mg total) by mouth 2 (two) times daily as needed. 60 tablet 2   glipiZIDE (GLUCOTROL XL) 10 MG 24 hr tablet TAKE 1 TABLET BY MOUTH EVERY DAY WITH BREAKFAST. STOP GLIPIZIDE ER 5MG  RX 90 tablet 1   levothyroxine (SYNTHROID) 75 MCG tablet TAKE 1 TABLET BY MOUTH EVERY DAY BEFORE BREAKFAST 90 tablet 1   lisinopril (ZESTRIL) 2.5 MG tablet TAKE 1 TABLET BY MOUTH DAILY. GENERIC EQUIVALENT FOR ZESTRIL 90 tablet 1   meclizine (ANTIVERT) 25 MG tablet Take 1 tablet (25 mg total) by mouth 3 (three) times daily as needed for dizziness. 30 tablet 1   Multiple Vitamin (MULTIVITAMIN) tablet Take 1 tablet by mouth daily.     Omega-3 Fatty Acids (FISH OIL PO) Take by mouth daily.     rosuvastatin (CRESTOR) 5 MG tablet Take 1 tablet (5 mg total) by mouth daily. 90 tablet 2   triamcinolone cream (KENALOG) 0.1 % Apply 1 application topically 2 (two) times daily. 80 g 2   No current facility-administered medications on file prior to visit.    BP (!) 134/59   Pulse 76   Temp 98.3 F (36.8 C)   Resp 18   Ht 5' 7.5" (1.715 m)   Wt 193 lb 6.4 oz (87.7 kg)   SpO2 98%   BMI 29.84 kg/m       Objective:   Physical Exam  General- No acute distress. Pleasant  patient. Neck- Full range of motion, no jvd Lungs- Clear, even and unlabored. Heart- regular rate and rhythm. Neurologic- CNII- XII grossly intact.   Left upper arm - circular raised  area 4 cm wide that is very red, moderate warm and indurated. No fluctuance. Upper aspect looks like 3/white heads. No fluctuance.      Assessment & Plan:   Patient Instructions  Left upper extremity skin infection/cellulitis with concern for forming abscess.  Got wound culture of area.   Will get cbc, cmp and a1c. If sugars high infection can occur easier.  Rocephin 1 gram im. Doxycycline 100 mg twice daily for 10 days. Rx advisement.  Send me my chart message if area same, improved or worse. If feels worse then refer to general surgeon for potential I and D.   Can apply warm salt compress twice daily.  Follow up Wednesday or sooner if needed   [employer], PA-C   .

## 2020-12-01 NOTE — Addendum Note (Signed)
Addended by: Maximino Sarin on: 12/01/2020 01:39 PM   Modules accepted: Orders

## 2020-12-01 NOTE — Patient Instructions (Addendum)
Left upper extremity skin infection/cellulitis with concern for forming abscess.  Got wound culture of area.   Will get cbc, cmp and a1c. If sugars high infection can occur easier.  Rocephin 1 gram im. Doxycycline 100 mg twice daily for 10 days. Rx advisement.  Send me my chart message tomorrow morning to update  if area same, improved or worse. If feels worse then refer to general surgeon for potential I and D.   Can apply warm salt compress twice daily.  Follow up Wednesday or sooner if needed

## 2020-12-01 NOTE — Addendum Note (Signed)
Addended by: Gwenevere Abbot on: 12/01/2020 07:03 PM   Modules accepted: Orders

## 2020-12-02 ENCOUNTER — Telehealth: Payer: Self-pay

## 2020-12-02 ENCOUNTER — Ambulatory Visit
Admission: RE | Admit: 2020-12-02 | Discharge: 2020-12-02 | Disposition: A | Discharge status: Home / Self Care | Payer: Medicare Other | Source: Ambulatory Visit | Attending: Family Medicine | Admitting: Family Medicine

## 2020-12-02 DIAGNOSIS — Z1231 Encounter for screening mammogram for malignant neoplasm of breast: Secondary | ICD-10-CM | POA: Diagnosis not present

## 2020-12-02 NOTE — Telephone Encounter (Signed)
Pt called stating she was to let Ramon Dredge know how the knot was today after seeing him yesterday.  She stated it is still the same and still hurts.  She stated her dermatologist can see her on Thursday and was unsure if Ramon Dredge wanted her to keep her follow up appt with hi,m tomorrow.  Please advise.  CB # [id number].

## 2020-12-03 ENCOUNTER — Other Ambulatory Visit: Payer: Self-pay | Admitting: Family Medicine

## 2020-12-03 ENCOUNTER — Ambulatory Visit: Payer: Medicare Other | Admitting: Medical

## 2020-12-03 DIAGNOSIS — I10 Essential (primary) hypertension: Secondary | ICD-10-CM

## 2020-12-04 DIAGNOSIS — L02414 Cutaneous abscess of left upper limb: Secondary | ICD-10-CM | POA: Diagnosis not present

## 2020-12-04 DIAGNOSIS — L0889 Other specified local infections of the skin and subcutaneous tissue: Secondary | ICD-10-CM | POA: Diagnosis not present

## 2020-12-05 LAB — WOUND CULTURE
MICRO NUMBER:: 12510934
RESULT:: NO GROWTH
SPECIMEN QUALITY:: ADEQUATE

## 2020-12-22 ENCOUNTER — Other Ambulatory Visit: Payer: Self-pay | Admitting: Family Medicine

## 2021-03-09 ENCOUNTER — Other Ambulatory Visit: Payer: Self-pay | Admitting: Family Medicine

## 2021-03-09 ENCOUNTER — Ambulatory Visit (INDEPENDENT_AMBULATORY_CARE_PROVIDER_SITE_OTHER): Payer: Medicare Other | Admitting: Family Medicine

## 2021-03-09 VITALS — BP 116/70 | HR 70 | Temp 98.0°F | Resp 16 | Ht 68.0 in | Wt 194.0 lb

## 2021-03-09 DIAGNOSIS — E038 Other specified hypothyroidism: Secondary | ICD-10-CM | POA: Diagnosis not present

## 2021-03-09 DIAGNOSIS — E119 Type 2 diabetes mellitus without complications: Secondary | ICD-10-CM | POA: Diagnosis not present

## 2021-03-09 DIAGNOSIS — E559 Vitamin D deficiency, unspecified: Secondary | ICD-10-CM

## 2021-03-09 DIAGNOSIS — Z23 Encounter for immunization: Secondary | ICD-10-CM

## 2021-03-09 DIAGNOSIS — Z0000 Encounter for general adult medical examination without abnormal findings: Secondary | ICD-10-CM | POA: Diagnosis not present

## 2021-03-09 DIAGNOSIS — Z124 Encounter for screening for malignant neoplasm of cervix: Secondary | ICD-10-CM

## 2021-03-09 DIAGNOSIS — E785 Hyperlipidemia, unspecified: Secondary | ICD-10-CM

## 2021-03-09 DIAGNOSIS — I10 Essential (primary) hypertension: Secondary | ICD-10-CM

## 2021-03-09 DIAGNOSIS — M810 Age-related osteoporosis without current pathological fracture: Secondary | ICD-10-CM

## 2021-03-09 DIAGNOSIS — E669 Obesity, unspecified: Secondary | ICD-10-CM

## 2021-03-09 LAB — CBC WITH DIFFERENTIAL/PLATELET
Basophils Absolute: 0.1 10*3/uL (ref 0.0–0.1)
Basophils Relative: 1.1 % (ref 0.0–3.0)
Eosinophils Absolute: 0.2 10*3/uL (ref 0.0–0.7)
Eosinophils Relative: 2.8 % (ref 0.0–5.0)
HCT: 44.3 % (ref 36.0–46.0)
Hemoglobin: 14.8 g/dL (ref 12.0–15.0)
Lymphocytes Relative: 25.5 % (ref 12.0–46.0)
Lymphs Abs: 2 10*3/uL (ref 0.7–4.0)
MCHC: 33.5 g/dL (ref 30.0–36.0)
MCV: 85.3 fl (ref 78.0–100.0)
Monocytes Absolute: 0.6 10*3/uL (ref 0.1–1.0)
Monocytes Relative: 7.1 % (ref 3.0–12.0)
Neutro Abs: 5 10*3/uL (ref 1.4–7.7)
Neutrophils Relative %: 63.5 % (ref 43.0–77.0)
Platelets: 235 10*3/uL (ref 150.0–400.0)
RBC: 5.19 Mil/uL — ABNORMAL HIGH (ref 3.87–5.11)
RDW: 13.4 % (ref 11.5–15.5)
WBC: 7.9 10*3/uL (ref 4.0–10.5)

## 2021-03-09 LAB — COMPREHENSIVE METABOLIC PANEL
ALT: 14 U/L (ref 0–35)
AST: 13 U/L (ref 0–37)
Albumin: 4.7 g/dL (ref 3.5–5.2)
Alkaline Phosphatase: 83 U/L (ref 39–117)
BUN: 14 mg/dL (ref 6–23)
CO2: 27 mEq/L (ref 19–32)
Calcium: 9.6 mg/dL (ref 8.4–10.5)
Chloride: 101 mEq/L (ref 96–112)
Creatinine, Ser: 0.74 mg/dL (ref 0.40–1.20)
GFR: 83.91 mL/min (ref >60.00)
Glucose, Bld: 187 mg/dL — ABNORMAL HIGH (ref 70–99)
Potassium: 4.2 mEq/L (ref 3.5–5.1)
Sodium: 138 mEq/L (ref 135–145)
Total Bilirubin: 0.6 mg/dL (ref 0.2–1.2)
Total Protein: 6.8 g/dL (ref 6.0–8.3)

## 2021-03-09 LAB — LIPID PANEL
Cholesterol: 154 mg/dL (ref 0–200)
HDL: 65 mg/dL (ref >39.00)
LDL Cholesterol: 72 mg/dL (ref 0–99)
NonHDL: 89.47
Total CHOL/HDL Ratio: 2
Triglycerides: 87 mg/dL (ref 0.0–149.0)
VLDL: 17.4 mg/dL (ref 0.0–40.0)

## 2021-03-09 LAB — TSH: TSH: 1.45 u[IU]/mL (ref 0.35–5.50)

## 2021-03-09 LAB — HEMOGLOBIN A1C: Hgb A1c MFr Bld: 8.7 % — ABNORMAL HIGH (ref 4.6–6.5)

## 2021-03-09 NOTE — Assessment & Plan Note (Signed)
Encouraged to get adequate exercise, calcium and vitamin d intake. Had an insurance problem with Prolia and stopped

## 2021-03-09 NOTE — Assessment & Plan Note (Signed)
Supplement and monitor 

## 2021-03-09 NOTE — Assessment & Plan Note (Signed)
Encourage heart healthy diet such as MIND or DASH diet, increase exercise, avoid trans fats, simple carbohydrates and processed foods, consider a krill or fish or flaxseed oil cap daily.  °

## 2021-03-09 NOTE — Assessment & Plan Note (Signed)
On Levothyroxine, continue to monitor 

## 2021-03-09 NOTE — Assessment & Plan Note (Signed)
Patient encouraged to maintain heart healthy diet, regular exercise, adequate sleep. Consider daily probiotics. Take medications as prescribed. Labs ordered and reviewed. Pap in 2020 has had TAH so repeat pelvicin 2025. MGM 10/22 repeat this year. Dexa 2022 repeat in 3-5 years. Prevanar 13 today

## 2021-03-09 NOTE — Progress Notes (Signed)
Subjective:   By signing my name below, I, Zite Okoli, attest that this documentation has been prepared under the direction and in the presence of Bradd Canary, MD. 03/09/2021    Patient ID: [name] [name], female    DOB: 1955-07-04, 66 y.o.   MRN: [id number]  Chief Complaint  Patient presents with   Annual Exam    HPI Patient is in today for a comprehensive physical exam.  No recent illnesses or ED visits.  She had come in for a skin infection in October 2022. Reports that the rash has reduced and healed but still shows residual redness.  She does not check her blood sugar levels at home.  She uses 1000 IU Vitamin D pills. She uses OTC calcium pills.  She is not exercising regularly but is trying to stay active.   She is UTD on dental an vision care.  She denies fever, congestion, eye pain, chest pain, palpitations, leg swelling, shortness of breath, nausea, abdominal pain, diarrhea and blood in stool. Also denies dysuria, frequency, back pain and headaches.   She has received the shingles, flu and tetanus vaccines. She will receive the pneumonia booster vaccine today.   No changes in family medical history.   Past Medical History:  Diagnosis Date   Back pain 06/13/2015   Diabetes mellitus    started Metformin 12/2012   History of shingles 2012   back   Hyperlipidemia    Hypertension    Hypothyroidism 07/29/2010   S/p FNA showing non-neoplastic goiter 7/12 w/ Dr Pollyann Kennedy    Obesity 08/06/2016   Osteoporosis 08/06/2016   Vitamin D deficiency 08/06/2016    Past Surgical History:  Procedure Laterality Date   ABDOMINAL HYSTERECTOMY  1997   APPENDECTOMY     MASS EXCISION  03/02/2011   Procedure: EXCISION MASS;  Surgeon: Nicki Reaper, MD;  Location: [facility];  Service: Orthopedics;  Laterality: Right;  Excision Cyst Interphangeal Right Thumb   OOPHORECTOMY     SHOULDER ARTHROSCOPY W/ ROTATOR CUFF REPAIR  2007   rt   TONSILLECTOMY     TRIGGER  FINGER RELEASE  11/01/2011   Procedure: RELEASE TRIGGER FINGER/A-1 PULLEY;  Surgeon: Nicki Reaper, MD;  Location: [facility];  Service: Orthopedics;  Laterality: Right;  right thumb, possible excision cyst    Family History  Problem Relation Age of Onset   Hypertension Mother    Diabetes Mother    Breast cancer Mother 46   Other Mother    Hypertension Father    Cancer Father        throat   Alcohol abuse Father    Hypertension Brother    Heart disease Brother        heart murmur   Alcohol abuse Brother    Cancer Maternal Grandmother    Heart disease Maternal Grandfather    Heart disease Paternal Grandmother    Cancer Maternal Aunt    Breast cancer Maternal Aunt     Social History   Socioeconomic History   Marital status: Married    Spouse name: Not on file   Number of children: Not on file   Years of education: Not on file   Highest education level: Not on file  Occupational History   Occupation: Physiological scientist  Tobacco Use   Smoking status: Never   Smokeless tobacco: Never  Substance and Sexual Activity   Alcohol use: No   Drug use: No   Sexual activity: Yes  Partners: Male    Comment: lives with husband, works at [employer], no major dietary   Other Topics Concern   Not on file  Social History Narrative   Married; step daughter   Regular exercise: walk 2 days a week   Caffeine use: 2 cups of coffee daily   Social Determinants of Health   Financial Resource Strain: Low Risk    Difficulty of Paying Living Expenses: Not hard at all  Food Insecurity: No Food Insecurity   Worried About Programme researcher, broadcasting/film/video in the Last Year: Never true   Barista in the Last Year: Never true  Transportation Needs: No Transportation Needs   Lack of Transportation (Medical): No   Lack of Transportation (Non-Medical): No  Physical Activity: Inactive   Days of Exercise per Week: 0 days   Minutes of Exercise per Session: 0 min  Stress: No Stress  Concern Present   Feeling of Stress : Not at all  Social Connections: Moderately Integrated   Frequency of Communication with Friends and Family: More than three times a week   Frequency of Social Gatherings with Friends and Family: More than three times a week   Attends Religious Services: More than 4 times per year   Active Member of [employer] or Organizations: No   Attends Banker Meetings: Never   Marital Status: Married  Catering manager Violence: Not At Risk   Fear of Current or Ex-Partner: No   Emotionally Abused: No   Physically Abused: No   Sexually Abused: No    Outpatient Medications Prior to Visit  Medication Sig Dispense Refill   amLODipine (NORVASC) 5 MG tablet TAKE 1 TABLET BY MOUTH DAILY 90 tablet 1   Ascorbic Acid (VITAMIN C) 1000 MG tablet Take 1,000 mg by mouth daily.     aspirin 81 MG EC tablet Take 81 mg by mouth daily.     calcium carbonate (OS-CAL - DOSED IN MG OF ELEMENTAL CALCIUM) 1250 (500 Ca) MG tablet Take 1 tablet by mouth daily.     glipiZIDE (GLUCOTROL XL) 10 MG 24 hr tablet TAKE 1 TABLET BY MOUTH EVERY DAY WITH BREAKFAST. STOP GLIPIZIDE ER 5MG  RX 90 tablet 1   levothyroxine (SYNTHROID) 75 MCG tablet TAKE 1 TABLET BY MOUTH EVERY DAY BEFORE BREAKFAST 90 tablet 1   lisinopril (ZESTRIL) 2.5 MG tablet TAKE 1 TABLET BY MOUTH DAILY. GENERIC EQUIVALENT FOR ZESTRIL 90 tablet 1   meclizine (ANTIVERT) 25 MG tablet Take 1 tablet (25 mg total) by mouth 3 (three) times daily as needed for dizziness. 30 tablet 1   Multiple Vitamin (MULTIVITAMIN) tablet Take 1 tablet by mouth daily.     Omega-3 Fatty Acids (FISH OIL PO) Take by mouth daily.     rosuvastatin (CRESTOR) 5 MG tablet Take 1 tablet (5 mg total) by mouth daily. 90 tablet 2   triamcinolone cream (KENALOG) 0.1 % Apply 1 application topically 2 (two) times daily. 80 g 2   diclofenac (VOLTAREN) 75 MG EC tablet Take 1 tablet (75 mg total) by mouth 2 (two) times daily as needed. 60 tablet 2   doxycycline  (VIBRA-TABS) 100 MG tablet Take 1 tablet (100 mg total) by mouth 2 (two) times daily. 20 tablet 0   metFORMIN (GLUCOPHAGE) 500 MG tablet Take 1 tablet (500 mg total) by mouth daily with breakfast. 30 tablet 3   No facility-administered medications prior to visit.    Allergies  Allergen Reactions   Codeine  Tramadol     Review of Systems  Constitutional:  Negative for fever.  HENT:  Negative for congestion.   Eyes:  Negative for pain.  Respiratory:  Negative for shortness of breath.   Cardiovascular:  Negative for chest pain, palpitations and leg swelling.  Gastrointestinal:  Negative for abdominal pain, blood in stool, diarrhea and nausea.  Genitourinary:  Negative for dysuria and frequency.  Musculoskeletal:  Negative for back pain.  Neurological:  Negative for headaches.      Objective:    Physical Exam Constitutional:      General: She is not in acute distress.    Appearance: She is well-developed.  HENT:     Head: Normocephalic and atraumatic.     Right Ear: Tympanic membrane, ear canal and external ear normal.     Left Ear: Tympanic membrane, ear canal and external ear normal.  Eyes:     Extraocular Movements:     Right eye: No nystagmus.     Left eye: No nystagmus.     Conjunctiva/sclera: Conjunctivae normal.  Neck:     Thyroid: No thyromegaly.  Cardiovascular:     Rate and Rhythm: Normal rate and regular rhythm.     Heart sounds: Normal heart sounds. No murmur heard. Pulmonary:     Effort: Pulmonary effort is normal. No respiratory distress.     Breath sounds: Normal breath sounds.  Abdominal:     General: Bowel sounds are normal. There is no distension.     Palpations: Abdomen is soft. There is no mass.     Tenderness: There is no abdominal tenderness.  Musculoskeletal:     Cervical back: Neck supple.     Comments: 5/5 strength in upper and lower extremities   Lymphadenopathy:     Cervical: No cervical adenopathy.  Skin:    General: Skin is warm and  dry.     Comments: Residual erythema on left arm from previous infection   Neurological:     Mental Status: She is alert and oriented to person, place, and time.     Deep Tendon Reflexes:     Reflex Scores:      Patellar reflexes are 2+ on the right side and 2+ on the left side. Psychiatric:        Behavior: Behavior normal.    BP 116/70    Pulse 70    Temp 98 F (36.7 C)    Resp 16    Ht 5\' 8"  (1.727 m)    Wt 194 lb (88 kg)    SpO2 98%    BMI 29.50 kg/m  Wt Readings from Last 3 Encounters:  03/09/21 194 lb (88 kg)  12/01/20 193 lb 6.4 oz (87.7 kg)  10/16/20 196 lb (88.9 kg)    Diabetic Foot Exam - Simple   No data filed    Lab Results  Component Value Date   WBC 10.2 12/01/2020   HGB 14.5 12/01/2020   HCT 43.9 12/01/2020   PLT 244.0 12/01/2020   GLUCOSE 288 (H) 12/01/2020   CHOL 146 08/04/2020   TRIG 85.0 08/04/2020   HDL 62.60 08/04/2020   LDLCALC 66 08/04/2020   ALT 13 12/01/2020   AST 12 12/01/2020   NA 137 12/01/2020   K 4.8 12/01/2020   CL 102 12/01/2020   CREATININE 0.76 12/01/2020   BUN 12 12/01/2020   CO2 27 12/01/2020   TSH 2.32 08/04/2020   HGBA1C 8.3 (H) 12/01/2020   MICROALBUR <0.7 08/22/2017    Lab  Results  Component Value Date   TSH 2.32 08/04/2020   Lab Results  Component Value Date   WBC 10.2 12/01/2020   HGB 14.5 12/01/2020   HCT 43.9 12/01/2020   MCV 86.6 12/01/2020   PLT 244.0 12/01/2020   Lab Results  Component Value Date   NA 137 12/01/2020   K 4.8 12/01/2020   CO2 27 12/01/2020   GLUCOSE 288 (H) 12/01/2020   BUN 12 12/01/2020   CREATININE 0.76 12/01/2020   BILITOT 0.5 12/01/2020   ALKPHOS 95 12/01/2020   AST 12 12/01/2020   ALT 13 12/01/2020   PROT 6.8 12/01/2020   ALBUMIN 4.6 12/01/2020   CALCIUM 9.7 12/01/2020   GFR 81.42 12/01/2020   Lab Results  Component Value Date   CHOL 146 08/04/2020   Lab Results  Component Value Date   HDL 62.60 08/04/2020   Lab Results  Component Value Date   LDLCALC 66 08/04/2020    Lab Results  Component Value Date   TRIG 85.0 08/04/2020   Lab Results  Component Value Date   CHOLHDL 2 08/04/2020   Lab Results  Component Value Date   HGBA1C 8.3 (H) 12/01/2020        Colonoscopy: Last completed on 06/25/2019. Hemorrhoids found in perianal exam. Polyps found in the rectum were removed with a cold snare. Repeat in 5 years. Dexa: Last checked on 03/18/2020. Patient was considered osteoporotic according to the [employer]. Repeat in 2-3 years. Mammogram: Last checked on 12/02/2020. Results were normal. Repeat in 1 year.  Assessment & Plan:   Problem List Items Addressed This Visit     Essential hypertension   Relevant Orders   CBC with Differential/Platelet   Comprehensive metabolic panel   Lipid panel   TSH   Annual physical exam    Patient encouraged to maintain heart healthy diet, regular exercise, adequate sleep. Consider daily probiotics. Take medications as prescribed. Labs ordered and reviewed. Pap in 2020 has had TAH so repeat pelvicin 2025. MGM 10/22 repeat this year. Dexa 2022 repeat in 3-5 years. Prevanar 13 today      Hypothyroidism    On Levothyroxine, continue to monitor      Relevant Orders   TSH   DM type 2 (diabetes mellitus, type 2) (HCC) - Primary    hgba1c acceptable, minimize simple carbs. Increase exercise as tolerated. Continue current meds      Relevant Orders   Hemoglobin A1c   Hyperlipidemia with target LDL less than 100    Encourage heart healthy diet such as MIND or DASH diet, increase exercise, avoid trans fats, simple carbohydrates and processed foods, consider a krill or fish or flaxseed oil cap daily.       Relevant Orders   Vitamin D 1,25 dihydroxy   Osteoporosis, post-menopausal    Encouraged to get adequate exercise, calcium and vitamin d intake. Had an insurance problem with Prolia and stopped      Vitamin D deficiency    Supplement and monitor       Relevant Orders   CBC with  Differential/Platelet   Comprehensive metabolic panel   Lipid panel   Obesity    Encouraged DASH or MIND diet, decrease po intake and increase exercise as tolerated. Needs 7-8 hours of sleep nightly. Avoid trans fats, eat small, frequent meals every 4-5 hours with lean proteins, complex carbs and healthy fats. Minimize simple carbs, high fat foods and processed foods      Cervical cancer screening  TAH SPO re[eat [e;voc om 2025        No orders of the defined types were placed in this encounter.   I,Zite Okoli,acting as a Neurosurgeonscribe for Danise EdgeStacey Nakesha Ebrahim, MD.,have documented all relevant documentation on the behalf of Danise EdgeStacey Jill Ruppe, MD,as directed by  Danise EdgeStacey Tyshae Stair, MD while in the presence of Danise EdgeStacey Ines Rebel, MD.   I, Bradd CanaryStacey A Italia Wolfert, MD. , personally preformed the services described in this documentation.  All medical record entries made by the scribe were at my direction and in my presence.  I have reviewed the chart and discharge instructions (if applicable) and agree that the record reflects my personal performance and is accurate and complete. 03/09/2021

## 2021-03-09 NOTE — Assessment & Plan Note (Signed)
hgba1c acceptable, minimize simple carbs. Increase exercise as tolerated. Continue current meds 

## 2021-03-09 NOTE — Assessment & Plan Note (Signed)
TAH SPO re[eat [e;voc om 2025

## 2021-03-09 NOTE — Patient Instructions (Addendum)
[employer] probiotic   DASH Eating Plan DASH stands for Dietary Approaches to Stop Hypertension. The DASH eating plan is a healthy eating plan that has been shown to: Reduce high blood pressure (hypertension). Reduce your risk for type 2 diabetes, heart disease, and stroke. Help with weight loss. What are tips for following this plan? Reading food labels Check food labels for the amount of salt (sodium) per serving. Choose foods with less than 5 percent of the Daily Value of sodium. Generally, foods with less than 300 milligrams (mg) of sodium per serving fit into this eating plan. To find whole grains, look for the word "whole" as the first word in the ingredient list. Shopping Buy products labeled as "low-sodium" or "no salt added." Buy fresh foods. Avoid canned foods and pre-made or frozen meals. Cooking Avoid adding salt when cooking. Use salt-free seasonings or herbs instead of table salt or sea salt. Check with your health care provider or pharmacist before using salt substitutes. Do not fry foods. Cook foods using healthy methods such as baking, boiling, grilling, roasting, and broiling instead. Cook with heart-healthy oils, such as olive, canola, avocado, soybean, or sunflower oil. Meal planning  Eat a balanced diet that includes: 4 or more servings of fruits and 4 or more servings of vegetables each day. Try to fill one-half of your plate with fruits and vegetables. 6-8 servings of whole grains each day. Less than 6 oz (170 g) of lean meat, poultry, or fish each day. A 3-oz (85-g) serving of meat is about the same size as a deck of cards. One egg equals 1 oz (28 g). 2-3 servings of low-fat dairy each day. One serving is 1 cup (237 mL). 1 serving of nuts, seeds, or beans 5 times each week. 2-3 servings of heart-healthy fats. Healthy fats called omega-3 fatty acids are found in foods such as walnuts, flaxseeds, fortified milks, and eggs. These fats are also found in cold-water  fish, such as sardines, salmon, and mackerel. Limit how much you eat of: Canned or prepackaged foods. Food that is high in trans fat, such as some fried foods. Food that is high in saturated fat, such as fatty meat. Desserts and other sweets, sugary drinks, and other foods with added sugar. Full-fat dairy products. Do not salt foods before eating. Do not eat more than 4 egg yolks a week. Try to eat at least 2 vegetarian meals a week. Eat more home-cooked food and less restaurant, buffet, and fast food. Lifestyle When eating at a restaurant, ask that your food be prepared with less salt or no salt, if possible. If you drink alcohol: Limit how much you use to: 0-1 drink a day for women who are not pregnant. 0-2 drinks a day for men. Be aware of how much alcohol is in your drink. In the [place] one drink equals one 12 oz bottle of beer (355 mL), one 5 oz glass of wine (148 mL), or one 1 oz glass of hard liquor (44 mL). General information Avoid eating more than 2,300 mg of salt a day. If you have hypertension, you may need to reduce your sodium intake to 1,500 mg a day. Work with your health care provider to maintain a healthy body weight or to lose weight. Ask what an ideal weight is for you. Get at least 30 minutes of exercise that causes your heart to beat faster (aerobic exercise) most days of the week. Activities may include walking, swimming, or biking. Work with your health  care provider or dietitian to adjust your eating plan to your individual calorie needs. What foods should I eat? Fruits All fresh, dried, or frozen fruit. Canned fruit in natural juice (without added sugar). Vegetables Fresh or frozen vegetables (raw, steamed, roasted, or grilled). Low-sodium or reduced-sodium tomato and vegetable juice. Low-sodium or reduced-sodium tomato sauce and tomato paste. Low-sodium or reduced-sodium canned vegetables. Grains Whole-grain or whole-wheat bread. Whole-grain or whole-wheat  pasta. Brown rice. [place]. Bulgur. Whole-grain and low-sodium cereals. Pita bread. Low-fat, low-sodium crackers. Whole-wheat flour tortillas. Meats and other proteins Skinless chicken or [place]. Ground chicken or [place]. Pork with fat trimmed off. Fish and seafood. Egg whites. Dried beans, peas, or lentils. Unsalted nuts, nut butters, and seeds. Unsalted canned beans. Lean cuts of beef with fat trimmed off. Low-sodium, lean precooked or cured meat, such as sausages or meat loaves. Dairy Low-fat (1%) or fat-free (skim) milk. Reduced-fat, low-fat, or fat-free cheeses. Nonfat, low-sodium ricotta or cottage cheese. Low-fat or nonfat yogurt. Low-fat, low-sodium cheese. Fats and oils Soft margarine without trans fats. Vegetable oil. Reduced-fat, low-fat, or light mayonnaise and salad dressings (reduced-sodium). Canola, safflower, olive, avocado, soybean, and sunflower oils. Avocado. Seasonings and condiments Herbs. Spices. Seasoning mixes without salt. Other foods Unsalted popcorn and pretzels. Fat-free sweets. The items listed above may not be a complete list of foods and beverages you can eat. Contact a dietitian for more information. What foods should I avoid? Fruits Canned fruit in a light or heavy syrup. Fried fruit. Fruit in cream or butter sauce. Vegetables Creamed or fried vegetables. Vegetables in a cheese sauce. Regular canned vegetables (not low-sodium or reduced-sodium). Regular canned tomato sauce and paste (not low-sodium or reduced-sodium). Regular tomato and vegetable juice (not low-sodium or reduced-sodium). Angie Fava. Olives. Grains Baked goods made with fat, such as croissants, muffins, or some breads. Dry pasta or rice meal packs. Meats and other proteins Fatty cuts of meat. Ribs. Fried meat. Berniece Salines. Bologna, salami, and other precooked or cured meats, such as sausages or meat loaves. Fat from the back of a pig (fatback). Bratwurst. Salted nuts and seeds. Canned beans with  added salt. Canned or smoked fish. Whole eggs or egg yolks. Chicken or [place] with skin. Dairy Whole or 2% milk, cream, and half-and-half. Whole or full-fat cream cheese. Whole-fat or sweetened yogurt. Full-fat cheese. Nondairy creamers. Whipped toppings. Processed cheese and cheese spreads. Fats and oils Butter. Stick margarine. Lard. Shortening. Ghee. Bacon fat. Tropical oils, such as coconut, palm kernel, or palm oil. Seasonings and condiments Onion salt, garlic salt, seasoned salt, table salt, and sea salt. Worcestershire sauce. Tartar sauce. Barbecue sauce. Teriyaki sauce. Soy sauce, including reduced-sodium. Steak sauce. Canned and packaged gravies. Fish sauce. Oyster sauce. Cocktail sauce. Store-bought horseradish. Ketchup. Mustard. Meat flavorings and tenderizers. Bouillon cubes. Hot sauces. Pre-made or packaged marinades. Pre-made or packaged taco seasonings. Relishes. Regular salad dressings. Other foods Salted popcorn and pretzels. The items listed above may not be a complete list of foods and beverages you should avoid. Contact a dietitian for more information. Where to find more information National Heart, Lung, and Blood Institute: https://wilson-eaton.com/ American Heart Association: www.heart.org Academy of Nutrition and Dietetics: www.eatright.[place]: www.kidney.org Summary The DASH eating plan is a healthy eating plan that has been shown to reduce high blood pressure (hypertension). It may also reduce your risk for type 2 diabetes, heart disease, and stroke. When on the DASH eating plan, aim to eat more fresh fruits and vegetables, whole grains, lean proteins, low-fat dairy, and  heart-healthy fats. With the DASH eating plan, you should limit salt (sodium) intake to 2,300 mg a day. If you have hypertension, you may need to reduce your sodium intake to 1,500 mg a day. Work with your health care provider or dietitian to adjust your eating plan to your individual  calorie needs. This information is not intended to replace advice given to you by your health care provider. Make sure you discuss any questions you have with your health care provider. Document Revised: 01/05/2019 Document Reviewed: 01/05/2019 Elsevier Patient Education  [place] calcium intake of 1200 to 1500 mg daily, divided into roughly 3 doses. Best source is the diet and a single dairy serving is about 500 mg, a supplement of calcium citrate once or twice daily to balance diet is fine if not getting enough in diet. Also need Vitamin D 2000 IU caps, 1 cap daily if not already taking vitamin D. Also recommend weight baring exercise on hips and upper body to keep bones strong    Preventive Care 65 Years and Older, Female Preventive care refers to lifestyle choices and visits with your health care provider that can promote health and wellness. Preventive care visits are also called wellness exams. What can I expect for my preventive care visit? Counseling Your health care provider may ask you questions about your: Medical history, including: Past medical problems. Family medical history. Pregnancy and menstrual history. History of falls. Current health, including: Memory and ability to understand (cognition). Emotional well-being. Home life and relationship well-being. Sexual activity and sexual health. Lifestyle, including: Alcohol, nicotine or tobacco, and drug use. Access to firearms. Diet, exercise, and sleep habits. Work and work Statistician. Sunscreen use. Safety issues such as seatbelt and bike helmet use. Physical exam Your health care provider will check your: Height and weight. These may be used to calculate your BMI (body mass index). BMI is a measurement that tells if you are at a healthy weight. Waist circumference. This measures the distance around your waistline. This measurement also tells if you are at a healthy weight and may help predict your  risk of certain diseases, such as type 2 diabetes and high blood pressure. Heart rate and blood pressure. Body temperature. Skin for abnormal spots. What immunizations do I need? Vaccines are usually given at various ages, according to a schedule. Your health care provider will recommend vaccines for you based on your age, medical history, and lifestyle or other factors, such as travel or where you work. What tests do I need? Screening Your health care provider may recommend screening tests for certain conditions. This may include: Lipid and cholesterol levels. Hepatitis C test. Hepatitis B test. HIV (human immunodeficiency virus) test. STI (sexually transmitted infection) testing, if you are at risk. Lung cancer screening. Colorectal cancer screening. Diabetes screening. This is done by checking your blood sugar (glucose) after you have not eaten for a while (fasting). Mammogram. Talk with your health care provider about how often you should have regular mammograms. BRCA-related cancer screening. This may be done if you have a family history of breast, ovarian, tubal, or peritoneal cancers. Bone density scan. This is done to screen for osteoporosis. Talk with your health care provider about your test results, treatment options, and if necessary, the need for more tests. Follow these instructions at home: Eating and drinking  Eat a diet that includes fresh fruits and vegetables, whole grains, lean protein, and low-fat dairy products. Limit your intake of foods with high amounts of  sugar, saturated fats, and salt. Take vitamin and mineral supplements as recommended by your health care provider. Do not drink alcohol if your health care provider tells you not to drink. If you drink alcohol: Limit how much you have to 0-1 drink a day. Know how much alcohol is in your drink. In the U.S., one drink equals one 12 oz bottle of beer (355 mL), one 5 oz glass of wine (148 mL), or one 1 oz glass of  hard liquor (44 mL). Lifestyle Brush your teeth every morning and night with fluoride toothpaste. Floss one time each day. Exercise for at least 30 minutes 5 or more days each week. Do not use any products that contain nicotine or tobacco. These products include cigarettes, chewing tobacco, and vaping devices, such as e-cigarettes. If you need help quitting, ask your health care provider. Do not use drugs. If you are sexually active, practice safe sex. Use a condom or other form of protection in order to prevent STIs. Take aspirin only as told by your health care provider. Make sure that you understand how much to take and what form to take. Work with your health care provider to find out whether it is safe and beneficial for you to take aspirin daily. Ask your health care provider if you need to take a cholesterol-lowering medicine (statin). Find healthy ways to manage stress, such as: Meditation, yoga, or listening to music. Journaling. Talking to a trusted person. Spending time with friends and family. Minimize exposure to UV radiation to reduce your risk of skin cancer. Safety Always wear your seat belt while driving or riding in a vehicle. Do not drive: If you have been drinking alcohol. Do not ride with someone who has been drinking. When you are tired or distracted. While texting. If you have been using any mind-altering substances or drugs. Wear a helmet and other protective equipment during sports activities. If you have firearms in your house, make sure you follow all gun safety procedures. What's next? Visit your health care provider once a year for an annual wellness visit. Ask your health care provider how often you should have your eyes and teeth checked. Stay up to date on all vaccines. This information is not intended to replace advice given to you by your health care provider. Make sure you discuss any questions you have with your health care provider. Document Revised:  07/30/2020 Document Reviewed: 07/30/2020 Elsevier Patient Education  [place].

## 2021-03-09 NOTE — Assessment & Plan Note (Signed)
Encouraged DASH or MIND diet, decrease po intake and increase exercise as tolerated. Needs 7-8 hours of sleep nightly. Avoid trans fats, eat small, frequent meals every 4-5 hours with lean proteins, complex carbs and healthy fats. Minimize simple carbs, high fat foods and processed foods 

## 2021-03-10 ENCOUNTER — Other Ambulatory Visit: Payer: Self-pay | Admitting: Family Medicine

## 2021-03-10 ENCOUNTER — Other Ambulatory Visit: Payer: Self-pay

## 2021-03-10 DIAGNOSIS — E785 Hyperlipidemia, unspecified: Secondary | ICD-10-CM

## 2021-03-10 DIAGNOSIS — E038 Other specified hypothyroidism: Secondary | ICD-10-CM

## 2021-03-10 DIAGNOSIS — I10 Essential (primary) hypertension: Secondary | ICD-10-CM

## 2021-03-10 DIAGNOSIS — E119 Type 2 diabetes mellitus without complications: Secondary | ICD-10-CM

## 2021-03-12 LAB — VITAMIN D 1,25 DIHYDROXY
Vitamin D 1, 25 (OH)2 Total: 66 pg/mL (ref 18–72)
Vitamin D2 1, 25 (OH)2: <8 pg/mL
Vitamin D3 1, 25 (OH)2: 66 pg/mL

## 2021-03-20 ENCOUNTER — Other Ambulatory Visit: Payer: Self-pay | Admitting: Family Medicine

## 2021-04-27 DIAGNOSIS — H524 Presbyopia: Secondary | ICD-10-CM | POA: Diagnosis not present

## 2021-04-27 DIAGNOSIS — Z7984 Long term (current) use of oral hypoglycemic drugs: Secondary | ICD-10-CM | POA: Diagnosis not present

## 2021-04-27 DIAGNOSIS — E1136 Type 2 diabetes mellitus with diabetic cataract: Secondary | ICD-10-CM | POA: Diagnosis not present

## 2021-04-27 DIAGNOSIS — H2513 Age-related nuclear cataract, bilateral: Secondary | ICD-10-CM | POA: Diagnosis not present

## 2021-04-27 LAB — HM DIABETES EYE EXAM

## 2021-05-08 ENCOUNTER — Other Ambulatory Visit: Payer: Medicare Other

## 2021-06-08 ENCOUNTER — Other Ambulatory Visit (INDEPENDENT_AMBULATORY_CARE_PROVIDER_SITE_OTHER): Payer: Medicare Other

## 2021-06-08 DIAGNOSIS — E038 Other specified hypothyroidism: Secondary | ICD-10-CM | POA: Diagnosis not present

## 2021-06-08 DIAGNOSIS — E119 Type 2 diabetes mellitus without complications: Secondary | ICD-10-CM

## 2021-06-08 DIAGNOSIS — E785 Hyperlipidemia, unspecified: Secondary | ICD-10-CM | POA: Diagnosis not present

## 2021-06-08 DIAGNOSIS — I10 Essential (primary) hypertension: Secondary | ICD-10-CM

## 2021-06-08 LAB — COMPREHENSIVE METABOLIC PANEL
ALT: 14 U/L (ref 0–35)
AST: 13 U/L (ref 0–37)
Albumin: 4.4 g/dL (ref 3.5–5.2)
Alkaline Phosphatase: 90 U/L (ref 39–117)
BUN: 13 mg/dL (ref 6–23)
CO2: 28 mEq/L (ref 19–32)
Calcium: 9.2 mg/dL (ref 8.4–10.5)
Chloride: 102 mEq/L (ref 96–112)
Creatinine, Ser: 0.71 mg/dL (ref 0.40–1.20)
GFR: 88.03 mL/min (ref >60.00)
Glucose, Bld: 168 mg/dL — ABNORMAL HIGH (ref 70–99)
Potassium: 3.9 mEq/L (ref 3.5–5.1)
Sodium: 138 mEq/L (ref 135–145)
Total Bilirubin: 0.6 mg/dL (ref 0.2–1.2)
Total Protein: 6.5 g/dL (ref 6.0–8.3)

## 2021-06-08 LAB — CBC WITH DIFFERENTIAL/PLATELET
Basophils Absolute: 0 10*3/uL (ref 0.0–0.1)
Basophils Relative: 0.6 % (ref 0.0–3.0)
Eosinophils Absolute: 0.2 10*3/uL (ref 0.0–0.7)
Eosinophils Relative: 3.1 % (ref 0.0–5.0)
HCT: 41 % (ref 36.0–46.0)
Hemoglobin: 13.8 g/dL (ref 12.0–15.0)
Lymphocytes Relative: 30.4 % (ref 12.0–46.0)
Lymphs Abs: 2.1 10*3/uL (ref 0.7–4.0)
MCHC: 33.7 g/dL (ref 30.0–36.0)
MCV: 85.5 fl (ref 78.0–100.0)
Monocytes Absolute: 0.5 10*3/uL (ref 0.1–1.0)
Monocytes Relative: 7.7 % (ref 3.0–12.0)
Neutro Abs: 4.1 10*3/uL (ref 1.4–7.7)
Neutrophils Relative %: 58.2 % (ref 43.0–77.0)
Platelets: 222 10*3/uL (ref 150.0–400.0)
RBC: 4.8 Mil/uL (ref 3.87–5.11)
RDW: 12.9 % (ref 11.5–15.5)
WBC: 7 10*3/uL (ref 4.0–10.5)

## 2021-06-08 LAB — LIPID PANEL
Cholesterol: 141 mg/dL (ref 0–200)
HDL: 62.1 mg/dL (ref >39.00)
LDL Cholesterol: 59 mg/dL (ref 0–99)
NonHDL: 79.09
Total CHOL/HDL Ratio: 2
Triglycerides: 101 mg/dL (ref 0.0–149.0)
VLDL: 20.2 mg/dL (ref 0.0–40.0)

## 2021-06-08 LAB — TSH: TSH: 2.1 u[IU]/mL (ref 0.35–5.50)

## 2021-06-08 LAB — HEMOGLOBIN A1C: Hgb A1c MFr Bld: 9.1 % — ABNORMAL HIGH (ref 4.6–6.5)

## 2021-06-09 MED ORDER — GLIPIZIDE ER 10 MG PO TB24: ORAL_TABLET | ORAL | 1 refills | Status: DC

## 2021-06-09 NOTE — Addendum Note (Signed)
Addended by: Lynnea Ferrier R on: 06/09/2021 02:04 PM ? ? Modules accepted: Orders ? ?

## 2021-06-16 ENCOUNTER — Other Ambulatory Visit: Payer: Self-pay | Admitting: Family Medicine

## 2021-06-16 DIAGNOSIS — I10 Essential (primary) hypertension: Secondary | ICD-10-CM

## 2021-06-22 ENCOUNTER — Ambulatory Visit (INDEPENDENT_AMBULATORY_CARE_PROVIDER_SITE_OTHER): Payer: Medicare Other | Admitting: Family Medicine

## 2021-06-22 VITALS — BP 122/80 | HR 76 | Temp 97.9°F | Resp 18 | Ht 67.5 in | Wt 187.8 lb

## 2021-06-22 DIAGNOSIS — E119 Type 2 diabetes mellitus without complications: Secondary | ICD-10-CM

## 2021-06-22 DIAGNOSIS — R300 Dysuria: Secondary | ICD-10-CM

## 2021-06-22 DIAGNOSIS — R3129 Other microscopic hematuria: Secondary | ICD-10-CM | POA: Diagnosis not present

## 2021-06-22 LAB — URINALYSIS, MICROSCOPIC ONLY

## 2021-06-22 LAB — POCT URINALYSIS DIP (MANUAL ENTRY)
Glucose, UA: >=1000 mg/dL — AB
Nitrite, UA: POSITIVE — AB
Protein Ur, POC: NEGATIVE mg/dL
Spec Grav, UA: 1.01 (ref 1.010–1.025)
Urobilinogen, UA: 0.2 E.U./dL
pH, UA: 6 (ref 5.0–8.0)

## 2021-06-22 LAB — GLUCOSE, POCT (MANUAL RESULT ENTRY): POC Glucose: 294 mg/dl — AB (ref 70–99)

## 2021-06-22 MED ORDER — SITAGLIPTIN PHOSPHATE 100 MG PO TABS: 100.0000 mg | ORAL_TABLET | Freq: Every day | ORAL | 3 refills | Status: DC

## 2021-06-22 MED ORDER — CEPHALEXIN 500 MG PO CAPS
500.0000 mg | ORAL_CAPSULE | Freq: Two times a day (BID) | ORAL | 0 refills | Status: DC
Start: 2021-06-22 — End: 2021-09-10

## 2021-06-22 NOTE — Patient Instructions (Signed)
It was good to see you again today- take care and I will be in touch with your urine culture asap ?We will add Januvia to your blood sugar regimen ?Take keflex twice a day for one week for presumed UTI- ?

## 2021-06-22 NOTE — Progress Notes (Addendum)
[facility] conservation officer at [employer] ?[address], [address] ?[place], [place] [address] ?336 [phone] ?Fax [phone] ? ?Date:  06/22/2021  ? ?Name:  [name] [name]   DOB:  Jul 12, 1955   MRN:  [id number] ? ?PCP:  Bradd Canary, MD  ? ? ?Chief Complaint: possible UTI (Sxs started Saturday and include low abd pressure, and frequency ) ? ? ?History of Present Illness: ? ?[name] [name] is a 66 y.o. very pleasant female patient who presents with the following: ? ?Pt seen today with concern of UTI ?I have not seen her myself in the past ?History of DM-her A1c had been in the eights, most recently 9.1% ? ?She has not had a UTI in a long time so she is not that familiar with symptoms- she tries to stay well hydrated ?She notes a pelvic pressure and urinary frequency- she has noted sx for 2 days or so ?No pain with urination ?No hematuria ?She is using azo- she thinks helping some  ? ?No vomiting  ?No fever ?No abd or back pain  ?She is taking glipizide only  ?She did not tolerate metformin well ?Her glipizide dose was just increased about 2 weeks ago  ?She does not typically check blood sugars at home ? ?She is under a fair amount of stress as her husband has a Parkinson's-like illness ? ?We note she has lost a few pounds, she is not really trying to lose weight ? ?Lab Results  ?Component Value Date  ? HGBA1C 9.1 (H) 06/08/2021  ? ?Wt Readings from Last 3 Encounters:  ?06/22/21 187 lb 12.8 oz (85.2 kg)  ?03/09/21 194 lb (88 kg)  ?12/01/20 193 lb 6.4 oz (87.7 kg)  ? ? ? ?Patient Active Problem List  ? Diagnosis Date Noted  ? H/O colonoscopy with polypectomy 01/30/2020  ? Vertigo 09/03/2018  ? Cervical cancer screening 02/24/2018  ? Left elbow pain 12/15/2017  ? Left knee pain 12/15/2017  ? Vitamin D deficiency 08/06/2016  ? Obesity 08/06/2016  ? Back pain 06/13/2015  ? 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 Subclinical hyperthyroidism 11/07/2012  ? Right thyroid nodule 11/07/2012  ? DM type 2 (diabetes mellitus, type  2) (HCC) 01/28/2011  ? Hyperlipidemia with target LDL less than 100 01/28/2011  ? Osteoporosis, post-menopausal 01/28/2011  ? Annual physical exam 07/29/2010  ? Hypothyroidism 07/29/2010  ? Shingles 05/21/2010  ? Adjustment reaction 03/25/2010  ? Essential hypertension 03/25/2010  ? ? ?Past Medical History:  ?Diagnosis Date  ? Back pain 06/13/2015  ? Diabetes mellitus   ? started Metformin 12/2012  ? History of shingles 2012  ? back  ? Hyperlipidemia   ? Hypertension   ? Hypothyroidism 07/29/2010  ? S/p FNA showing non-neoplastic goiter 7/12 w/ Dr Pollyann Kennedy   ? Obesity 08/06/2016  ? Osteoporosis 08/06/2016  ? Vitamin D deficiency 08/06/2016  ? ? ?Past Surgical History:  ?Procedure Laterality Date  ? ABDOMINAL HYSTERECTOMY  1997  ? APPENDECTOMY    ? MASS EXCISION  03/02/2011  ? Procedure: EXCISION MASS;  Surgeon: Nicki Reaper, MD;  Location: [facility];  Service: Orthopedics;  Laterality: Right;  Excision Cyst Interphangeal Right Thumb  ? OOPHORECTOMY    ? SHOULDER ARTHROSCOPY W/ ROTATOR CUFF REPAIR  2007  ? rt  ? TONSILLECTOMY    ? TRIGGER FINGER RELEASE  11/01/2011  ? Procedure: RELEASE TRIGGER FINGER/A-1 PULLEY;  Surgeon: Nicki Reaper, MD;  Location: [facility];  Service: Orthopedics;  Laterality: Right;  right thumb, possible excision cyst  ? ? ?Social History  ? ?Tobacco Use  ? Smoking status: Never  ? Smokeless tobacco: Never  ?Substance Use Topics  ? Alcohol use: No  ? Drug use: No  ? ? ?Family History  ?Problem Relation Age of Onset  ? Hypertension Mother   ? Diabetes Mother   ? Breast cancer Mother 95  ? Other Mother   ? Hypertension Father   ? Cancer Father   ?     throat  ? Alcohol abuse Father   ? Hypertension Brother   ? Heart disease Brother   ?     heart murmur  ? Alcohol abuse Brother   ? Cancer Maternal Grandmother   ? Heart disease Maternal Grandfather   ? Heart disease Paternal Grandmother   ? Cancer Maternal Aunt   ? Breast cancer Maternal Aunt   ? ? ?Allergies  ?Allergen  Reactions  ? Codeine   ? Tramadol   ? ? ?Medication list has been reviewed and updated. ? ?Current Outpatient Medications on File Prior to Visit  ?Medication Sig Dispense Refill  ? amLODipine (NORVASC) 5 MG tablet TAKE 1 TABLET BY MOUTH DAILY 90 tablet 1  ? Ascorbic Acid (VITAMIN C) 1000 MG tablet Take 1,000 mg by mouth daily.    ? aspirin 81 MG EC tablet Take 81 mg by mouth daily.    ? calcium carbonate (OS-CAL - DOSED IN MG OF ELEMENTAL CALCIUM) 1250 (500 Ca) MG tablet Take 1 tablet by mouth daily.    ? glipiZIDE (GLUCOTROL XL) 10 MG 24 hr tablet TAKE 2 TABLETS BY MOUTH EVERY DAY WITH BREAKFAST 180 tablet 1  ? levothyroxine (SYNTHROID) 75 MCG tablet TAKE 1 TABLET BY MOUTH EVERY DAY BEFORE BREAKFAST. 90 tablet 1  ? lisinopril (ZESTRIL) 2.5 MG tablet TAKE 1 TABLET BY MOUTH DAILY. GENERIC EQUIVALENT FOR ZESTRIL 90 tablet 1  ? meclizine (ANTIVERT) 25 MG tablet Take 1 tablet (25 mg total) by mouth 3 (three) times daily as needed for dizziness. 30 tablet 1  ? Multiple Vitamin (MULTIVITAMIN) tablet Take 1 tablet by mouth daily.    ? Omega-3 Fatty Acids (FISH OIL PO) Take by mouth daily.    ? rosuvastatin (CRESTOR) 5 MG tablet TAKE 1 TABLET(5 MG TOTAL) BY MOUTH DAILY 90 tablet 2  ? triamcinolone cream (KENALOG) 0.1 % Apply 1 application topically 2 (two) times daily. 80 g 2  ? ?No current facility-administered medications on file prior to visit.  ? ? ?Review of Systems: ? ?As per HPI- otherwise negative. ? ? ?Physical Examination: ?Vitals:  ? 06/22/21 1027  ?BP: 122/80  ?Pulse: 76  ?Resp: 18  ?Temp: 97.9 ?F (36.6 ?C)  ?SpO2: 98%  ? ?Vitals:  ? 06/22/21 1027  ?Weight: 187 lb 12.8 oz (85.2 kg)  ?Height: 5' 7.5" (1.715 m)  ? ?Body mass index is 28.98 kg/m?. ?Ideal Body Weight: Weight in (lb) to have BMI = 25: 161.7 ? ?GEN: no acute distress.  Looks well ?HEENT: Atraumatic, Normocephalic.  ?Ears and Nose: No external deformity. ?CV: RRR, No M/G/R. No JVD. No thrill. No extra heart sounds. ?PULM: CTA B, no wheezes, crackles,  rhonchi. No retractions. No resp. distress. No accessory muscle use. ?ABD: S, NT, ND, +BS. No rebound. No HSM. ?EXTR: No c/c/e ?PSYCH: Normally interactive. Conversant.  ?No CVA tenderness or abdominal tenderness ? ?Results for orders placed or performed in visit on 06/22/21  ?POCT urinalysis dipstick  ?Result Value Ref Range  ?  Color, UA orange (A) yellow  ? Clarity, UA cloudy (A) clear  ? Glucose, UA >=1,000 (A) negative mg/dL  ? Bilirubin, UA moderate (A) negative  ? Ketones, POC UA trace (5) (A) negative mg/dL  ? Spec Grav, UA 1.010 1.010 - 1.025  ? Blood, UA moderate (A) negative  ? pH, UA 6.0 5.0 - 8.0  ? Protein Ur, POC negative negative mg/dL  ? Urobilinogen, UA 0.2 0.2 or 1.0 E.U./dL  ? Nitrite, UA Positive (A) Negative  ? Leukocytes, UA Trace (A) Negative  ?POCT glucose (manual entry)  ?Result Value Ref Range  ? POC Glucose 294 (A) 70 - 99 mg/dl  ? ? ?Assessment and Plan: ?Dysuria - Plan: Urine Culture, POCT urinalysis dipstick, cephALEXin (KEFLEX) 500 MG capsule ? ?Microhematuria - Plan: Urine Microscopic Only ? ?Type 2 diabetes mellitus without complication, without long-term current use of insulin (HCC) - Plan: POCT glucose (manual entry), sitaGLIPtin (JANUVIA) 100 MG tablet ?Patient seen today with UTI type symptoms, UA is difficult to interpret due to Azo use.  We will start her on Keflex and send urine for culture ?Random glucose is consistent with most recent A1c.  Advised patient that another medication is likely needed for blood sugar control.  For the time being we will try adding Januvia; a GLP-1 or SGLT2 may be more of a long-term solution for her.  However, for now would like an option she can start right away and which will not increase glycosuria ? ?She is asked to please let me know if not feeling better within the next 24 to 36 hours ?Will plan further follow- up pending urine culture ? ? ?Signed ?Abbe AmsterdamJessica Ariana Cavenaugh, MD ? ?Addendum 5/10, received urine culture as below-no definite infection.   She also does have microhematuria ?Sent MyChart message ?Please referral to urology ?Results for orders placed or performed in visit on 06/22/21  ?Urine Culture  ? Specimen: Urine  ?Result Value Ref Range  ?

## 2021-06-23 ENCOUNTER — Ambulatory Visit: Payer: Medicare Other | Admitting: Family Medicine

## 2021-06-23 LAB — URINE CULTURE
MICRO NUMBER:: 13364484
SPECIMEN QUALITY:: ADEQUATE

## 2021-06-24 ENCOUNTER — Encounter: Payer: Self-pay | Admitting: Family Medicine

## 2021-06-24 NOTE — Addendum Note (Signed)
Addended by: Abbe Amsterdam C on: 06/24/2021 12:38 PM ? ? Modules accepted: Orders ? ?

## 2021-08-11 ENCOUNTER — Other Ambulatory Visit: Payer: Self-pay | Admitting: Family Medicine

## 2021-09-08 ENCOUNTER — Other Ambulatory Visit: Payer: Self-pay | Admitting: Family Medicine

## 2021-09-08 DIAGNOSIS — I10 Essential (primary) hypertension: Secondary | ICD-10-CM

## 2021-09-10 ENCOUNTER — Encounter: Payer: Self-pay | Admitting: Family Medicine

## 2021-09-10 ENCOUNTER — Ambulatory Visit (INDEPENDENT_AMBULATORY_CARE_PROVIDER_SITE_OTHER): Payer: Medicare Other | Admitting: Family Medicine

## 2021-09-10 VITALS — BP 124/80 | HR 68 | Resp 20 | Ht 67.5 in | Wt 194.0 lb

## 2021-09-10 DIAGNOSIS — E559 Vitamin D deficiency, unspecified: Secondary | ICD-10-CM | POA: Diagnosis not present

## 2021-09-10 DIAGNOSIS — R3915 Urgency of urination: Secondary | ICD-10-CM | POA: Diagnosis not present

## 2021-09-10 DIAGNOSIS — E785 Hyperlipidemia, unspecified: Secondary | ICD-10-CM

## 2021-09-10 DIAGNOSIS — I10 Essential (primary) hypertension: Secondary | ICD-10-CM

## 2021-09-10 DIAGNOSIS — E038 Other specified hypothyroidism: Secondary | ICD-10-CM | POA: Diagnosis not present

## 2021-09-10 DIAGNOSIS — R42 Dizziness and giddiness: Secondary | ICD-10-CM

## 2021-09-10 DIAGNOSIS — E119 Type 2 diabetes mellitus without complications: Secondary | ICD-10-CM | POA: Diagnosis not present

## 2021-09-10 DIAGNOSIS — F5102 Adjustment insomnia: Secondary | ICD-10-CM

## 2021-09-10 DIAGNOSIS — F4321 Adjustment disorder with depressed mood: Secondary | ICD-10-CM

## 2021-09-10 DIAGNOSIS — M810 Age-related osteoporosis without current pathological fracture: Secondary | ICD-10-CM

## 2021-09-10 LAB — CBC
HCT: 41.7 % (ref 36.0–46.0)
Hemoglobin: 14.2 g/dL (ref 12.0–15.0)
MCHC: 34 g/dL (ref 30.0–36.0)
MCV: 84.8 fl (ref 78.0–100.0)
Platelets: 219 10*3/uL (ref 150.0–400.0)
RBC: 4.92 Mil/uL (ref 3.87–5.11)
RDW: 12.9 % (ref 11.5–15.5)
WBC: 8.4 10*3/uL (ref 4.0–10.5)

## 2021-09-10 LAB — LIPID PANEL
Cholesterol: 152 mg/dL (ref 0–200)
HDL: 65.6 mg/dL (ref >39.00)
LDL Cholesterol: 75 mg/dL (ref 0–99)
NonHDL: 86.74
Total CHOL/HDL Ratio: 2
Triglycerides: 59 mg/dL (ref 0.0–149.0)
VLDL: 11.8 mg/dL (ref 0.0–40.0)

## 2021-09-10 LAB — COMPREHENSIVE METABOLIC PANEL
ALT: 17 U/L (ref 0–35)
AST: 16 U/L (ref 0–37)
Albumin: 4.7 g/dL (ref 3.5–5.2)
Alkaline Phosphatase: 67 U/L (ref 39–117)
BUN: 13 mg/dL (ref 6–23)
CO2: 29 mEq/L (ref 19–32)
Calcium: 9.7 mg/dL (ref 8.4–10.5)
Chloride: 101 mEq/L (ref 96–112)
Creatinine, Ser: 0.79 mg/dL (ref 0.40–1.20)
GFR: 77.3 mL/min (ref >60.00)
Glucose, Bld: 173 mg/dL — ABNORMAL HIGH (ref 70–99)
Potassium: 4.5 mEq/L (ref 3.5–5.1)
Sodium: 139 mEq/L (ref 135–145)
Total Bilirubin: 0.6 mg/dL (ref 0.2–1.2)
Total Protein: 6.9 g/dL (ref 6.0–8.3)

## 2021-09-10 LAB — MICROALBUMIN / CREATININE URINE RATIO
Creatinine,U: 33.4 mg/dL
Microalb Creat Ratio: 2.1 mg/g (ref 0.0–30.0)
Microalb, Ur: <0.7 mg/dL (ref 0.0–1.9)

## 2021-09-10 LAB — TSH: TSH: 1.68 u[IU]/mL (ref 0.35–5.50)

## 2021-09-10 LAB — VITAMIN D 25 HYDROXY (VIT D DEFICIENCY, FRACTURES): VITD: 32.02 ng/mL (ref 30.00–100.00)

## 2021-09-10 LAB — HEMOGLOBIN A1C: Hgb A1c MFr Bld: 7.7 % — ABNORMAL HIGH (ref 4.6–6.5)

## 2021-09-10 NOTE — Assessment & Plan Note (Signed)
She is tearful today due to the unexpected death of her husband last month at 66 years of age. He was struggling with PSP but then got pneumonia and became septic. She notes she has good family support and her church but if she worsens and she would like counseling or medical help she will let us know

## 2021-09-10 NOTE — Assessment & Plan Note (Signed)
hgba1c unacceptable, minimize simple carbs. Increase exercise as tolerated. Continue current meds 

## 2021-09-10 NOTE — Assessment & Plan Note (Signed)
Well controlled, no changes to meds. Encouraged heart healthy diet such as the DASH diet and exercise as tolerated.  °

## 2021-09-10 NOTE — Assessment & Plan Note (Signed)
Encourage heart healthy diet such as MIND or DASH diet, increase exercise, avoid trans fats, simple carbohydrates and processed foods, consider a krill or fish or flaxseed oil cap daily.  °

## 2021-09-10 NOTE — Patient Instructions (Addendum)
Encouraged good sleep hygiene such as dark, quiet room. No blue/green glowing lights such as computer screens in bedroom. No alcohol or stimulants in evening. Cut down on caffeine as able. Regular exercise is helpful but not just prior to bed time.    Magnesium Glycinate 200-400 mg at bed  Melatonin 2-5 mg at bedtime  [employer] Sleep +  Benadryl is ok for short periods  Unisom for sleep  L Tryptophan capsules for sleep   Multivitamin with minerals and a fish or krill oil cap daily

## 2021-09-10 NOTE — Assessment & Plan Note (Signed)
Supplement and monitor 

## 2021-09-10 NOTE — Assessment & Plan Note (Signed)
Hydrate well and protein every 4 hours. Meclizine prn and report if worse

## 2021-09-10 NOTE — Assessment & Plan Note (Signed)
On Levothyroxine, continue to monitor 

## 2021-09-10 NOTE — Progress Notes (Signed)
Subjective:   By signing my name below, I, [name], attest that this documentation has been prepared under the direction and in the presence of Mosie Lukes, MD 09/10/2021.     Patient ID: [name] [name], female    DOB: [birth year], 66 y.o.   MRN: [id number]  Chief Complaint  Patient presents with   Follow-up    HPI Patient is in today for an office visit.  Sadness: She is sad during today's visit and reports that her husband passed away last month due to pneumonia and sepsis at 66 years old.   Diet: She has been informed about incorporating protein into her diet and maintaining a balanced diet. She states that she does well at drinking an appropriate amount of water.   Sleep: She expresses some difficulties with sleeping since her husband's death. She states that she awakes every night at 4:30 am due to stress. She has been informed about routines and supplements to help her sleep better.  Blood glucose: Her A1C was elevated during her last visit. Lab Results  Component Value Date   HGBA1C 7.7 (H) 09/10/2021   UTI: She suspects that she had a UTI 2 weeks ago as she felt urgency and pressure when urinating. She denies burning. She states that she took AZO and drank cranberry juice to manage her symptoms and says that this was helpful.   Vertigo: She states that she was having vertigo [place] the [facility] home about 1 month ago and took Meclizine 25 mg to manage her symptoms.   Past Medical History:  Diagnosis Date   Back pain 06/13/2015   Diabetes mellitus    started Metformin 12/2012   History of shingles 2012   back   Hyperlipidemia    Hypertension    Hypothyroidism 07/29/2010   S/p FNA showing non-neoplastic goiter 7/12 w/ Dr Constance Holster    Obesity 08/06/2016   Osteoporosis 08/06/2016   Vitamin D deficiency 08/06/2016    Past Surgical History:  Procedure Laterality Date   ABDOMINAL HYSTERECTOMY  1997   APPENDECTOMY     MASS EXCISION  03/02/2011   Procedure:  EXCISION MASS;  Surgeon: Wynonia Sours, MD;  Location: [facility];  Service: Orthopedics;  Laterality: Right;  Excision Cyst Interphangeal Right Thumb   OOPHORECTOMY     SHOULDER ARTHROSCOPY W/ ROTATOR CUFF REPAIR  2007   rt   TONSILLECTOMY     TRIGGER FINGER RELEASE  11/01/2011   Procedure: RELEASE TRIGGER FINGER/A-1 PULLEY;  Surgeon: Wynonia Sours, MD;  Location: [facility];  Service: Orthopedics;  Laterality: Right;  right thumb, possible excision cyst    Family History  Problem Relation Age of Onset   Hypertension Mother    Diabetes Mother    Breast cancer Mother 45   Other Mother    Hypertension Father    Cancer Father        throat   Alcohol abuse Father    Hypertension Brother    Heart disease Brother        heart murmur   Alcohol abuse Brother    Cancer Maternal Grandmother    Heart disease Maternal Grandfather    Heart disease Paternal Grandmother    Cancer Maternal Aunt    Breast cancer Maternal Aunt     Social History   Socioeconomic History   Marital status: Married    Spouse name: Not on file   Number of children: Not on file   Years  of education: Not on file   Highest education level: Not on file  Occupational History   Occupation: Marketing executive  Tobacco Use   Smoking status: Never   Smokeless tobacco: Never  Substance and Sexual Activity   Alcohol use: No   Drug use: No   Sexual activity: Yes    Partners: Male    Comment: lives with husband, works at [employer], no major dietary   Other Topics Concern   Not on file  Social History Narrative   Married; step daughter   Regular exercise: walk 2 days a week   Caffeine use: 2 cups of coffee daily   Social Determinants of Health   Financial Resource Strain: Low Risk  (10/16/2020)   Overall Financial Resource Strain (CARDIA)    Difficulty of Paying Living Expenses: Not hard at all  Food Insecurity: No Food Insecurity (10/16/2020)   Hunger Vital Sign    Worried About  Running Out of Food in the Last Year: Never true    Falls in the Last Year: Never true  Transportation Needs: No Transportation Needs (10/16/2020)   PRAPARE - Hydrologist (Medical): No    Lack of Transportation (Non-Medical): No  Physical Activity: Inactive (10/16/2020)   Exercise Vital Sign    Days of Exercise per Week: 0 days    Minutes of Exercise per Session: 0 min  Stress: No Stress Concern Present (10/16/2020)   [place]    Feeling of Stress : Not at all  Social Connections: Moderately Integrated (10/16/2020)   Social Connection and Isolation Panel [NHANES]    Frequency of Communication with Friends and Family: More than three times a week    Frequency of Social Gatherings with Friends and Family: More than three times a week    Attends Religious Services: More than 4 times per year    Active Member of [employer] or Organizations: No    Attends Archivist Meetings: Never    Marital Status: Married  Human resources officer Violence: Not At Risk (10/16/2020)   Humiliation, Afraid, Rape, and Kick questionnaire    Fear of Current or Ex-Partner: No    Emotionally Abused: No    Physically Abused: No    Sexually Abused: No    Outpatient Medications Prior to Visit  Medication Sig Dispense Refill   amLODipine (NORVASC) 5 MG tablet TAKE 1 TABLET BY MOUTH DAILY 90 tablet 0   Ascorbic Acid (VITAMIN C) 1000 MG tablet Take 1,000 mg by mouth daily.     aspirin 81 MG EC tablet Take 81 mg by mouth daily.     calcium carbonate (OS-CAL - DOSED IN MG OF ELEMENTAL CALCIUM) 1250 (500 Ca) MG tablet Take 1 tablet by mouth daily.     glipiZIDE (GLUCOTROL XL) 10 MG 24 hr tablet TAKE 2 TABLETS BY MOUTH EVERY DAY WITH BREAKFAST 180 tablet 1   levothyroxine (SYNTHROID) 75 MCG tablet TAKE 1 TABLET BY MOUTH EVERY DAY BEFORE BREAKFAST. 90 tablet 1   lisinopril (ZESTRIL) 2.5 MG tablet TAKE 1 TABLET BY MOUTH DAILY  GENERIC EQUIVALENT FOR ZESTRIL 90 tablet 1   meclizine (ANTIVERT) 25 MG tablet Take 1 tablet (25 mg total) by mouth 3 (three) times daily as needed for dizziness. 30 tablet 1   Multiple Vitamin (MULTIVITAMIN) tablet Take 1 tablet by mouth daily.     rosuvastatin (CRESTOR) 5 MG tablet TAKE 1 TABLET(5 MG TOTAL) BY MOUTH  DAILY 90 tablet 2   sitaGLIPtin (JANUVIA) 100 MG tablet Take 1 tablet (100 mg total) by mouth daily. 30 tablet 3   triamcinolone cream (KENALOG) 0.1 % Apply 1 application topically 2 (two) times daily. 80 g 2   cephALEXin (KEFLEX) 500 MG capsule Take 1 capsule (500 mg total) by mouth 2 (two) times daily. 14 capsule 0   Omega-3 Fatty Acids (FISH OIL PO) Take by mouth daily.     No facility-administered medications prior to visit.    Allergies  Allergen Reactions   Codeine    Tramadol     Review of Systems  Psychiatric/Behavioral:         (+) Sadness        Objective:    Physical Exam Constitutional:      General: She is not in acute distress.    Appearance: Normal appearance. She is not ill-appearing.  HENT:     Head: Normocephalic and atraumatic.     Right Ear: External ear normal.     Left Ear: External ear normal.  Eyes:     Extraocular Movements: Extraocular movements intact.     Pupils: Pupils are equal, round, and reactive to light.  Cardiovascular:     Rate and Rhythm: Normal rate and regular rhythm.     Pulses: Normal pulses.     Heart sounds: Normal heart sounds. No murmur heard.    No gallop.  Pulmonary:     Effort: Pulmonary effort is normal. No respiratory distress.     Breath sounds: Normal breath sounds. No wheezing or rales.  Skin:    General: Skin is warm and dry.  Neurological:     Mental Status: She is alert and oriented to person, place, and time.  Psychiatric:        Mood and Affect: Mood normal.        Behavior: Behavior normal.        Judgment: Judgment normal.     BP 124/80 (BP Location: Right Arm, Patient Position: Sitting,  Cuff Size: Normal)   Pulse 68   Resp 20   Ht 5' 7.5" (1.715 m)   Wt 194 lb (88 kg)   SpO2 97%   BMI 29.94 kg/m  Wt Readings from Last 3 Encounters:  09/10/21 194 lb (88 kg)  06/22/21 187 lb 12.8 oz (85.2 kg)  03/09/21 194 lb (88 kg)    Diabetic Foot Exam - Simple   No data filed    Lab Results  Component Value Date   WBC 8.4 09/10/2021   HGB 14.2 09/10/2021   HCT 41.7 09/10/2021   PLT 219.0 09/10/2021   GLUCOSE 173 (H) 09/10/2021   CHOL 152 09/10/2021   TRIG 59.0 09/10/2021   HDL 65.60 09/10/2021   LDLCALC 75 09/10/2021   ALT 17 09/10/2021   AST 16 09/10/2021   NA 139 09/10/2021   K 4.5 09/10/2021   CL 101 09/10/2021   CREATININE 0.79 09/10/2021   BUN 13 09/10/2021   CO2 29 09/10/2021   TSH 1.68 09/10/2021   HGBA1C 7.7 (H) 09/10/2021   MICROALBUR <0.7 09/10/2021    Lab Results  Component Value Date   TSH 1.68 09/10/2021   Lab Results  Component Value Date   WBC 8.4 09/10/2021   HGB 14.2 09/10/2021   HCT 41.7 09/10/2021   MCV 84.8 09/10/2021   PLT 219.0 09/10/2021   Lab Results  Component Value Date   NA 139 09/10/2021   K 4.5 09/10/2021  CO2 29 09/10/2021   GLUCOSE 173 (H) 09/10/2021   BUN 13 09/10/2021   CREATININE 0.79 09/10/2021   BILITOT 0.6 09/10/2021   ALKPHOS 67 09/10/2021   AST 16 09/10/2021   ALT 17 09/10/2021   PROT 6.9 09/10/2021   ALBUMIN 4.7 09/10/2021   CALCIUM 9.7 09/10/2021   GFR 77.30 09/10/2021   Lab Results  Component Value Date   CHOL 152 09/10/2021   Lab Results  Component Value Date   HDL 65.60 09/10/2021   Lab Results  Component Value Date   LDLCALC 75 09/10/2021   Lab Results  Component Value Date   TRIG 59.0 09/10/2021   Lab Results  Component Value Date   CHOLHDL 2 09/10/2021   Lab Results  Component Value Date   HGBA1C 7.7 (H) 09/10/2021       Assessment & Plan:   Problem List Items Addressed This Visit     Essential hypertension - Primary    Well controlled, no changes to meds.  Encouraged heart healthy diet such as the DASH diet and exercise as tolerated.       Relevant Orders   Comprehensive metabolic panel (Completed)   CBC (Completed)   Hypothyroidism    On Levothyroxine, continue to monitor      Relevant Orders   TSH (Completed)   DM type 2 (diabetes mellitus, type 2) (HCC)    hgba1c unacceptable, minimize simple carbs. Increase exercise as tolerated. Continue current meds      Relevant Orders   Hemoglobin A1c (Completed)   Microalbumin / creatinine urine ratio (Completed)   Hyperlipidemia with target LDL less than 100    Encourage heart healthy diet such as MIND or DASH diet, increase exercise, avoid trans fats, simple carbohydrates and processed foods, consider a krill or fish or flaxseed oil cap daily.       Relevant Orders   Lipid panel (Completed)   Osteoporosis, post-menopausal   Relevant Orders   VITAMIN D 25 Hydroxy (Vit-D Deficiency, Fractures) (Completed)   Vitamin D deficiency    Supplement and monitor      Relevant Orders   VITAMIN D 25 Hydroxy (Vit-D Deficiency, Fractures) (Completed)   Vertigo    Hydrate well and protein every 4 hours. Meclizine prn and report if worse      Grief reaction    She is tearful today due to the unexpected death of her husband last month at 70 years of age. He was struggling with PSP but then got pneumonia and became septic. She notes she has good family support and her church but if she worsens and she would like counseling or medical help she will let us know      Insomnia due to stress    See AVS for some suggestions to manage this. Encouraged good sleep hygiene such as dark, quiet room. No blue/green glowing lights such as computer screens in bedroom. No alcohol or stimulants in evening. Cut down on caffeine as able. Regular exercise is helpful but not just prior to bed time. She can call for medical assistance if this persists or worsens      Other Visit Diagnoses     Urinary urgency        Relevant Orders   Urinalysis   Urine Culture      No orders of the defined types were placed in this encounter.  I, Penni Homans, MD, personally preformed the services described in this documentation.  All medical record entries made by the scribe  were at my direction and in my presence.  I have reviewed the chart and discharge instructions (if applicable) and agree that the record reflects my personal performance and is accurate and complete. 09/10/2021.  I,Mohammed Iqbal,acting as a scribe for Danise Edge, MD.,have documented all relevant documentation on the behalf of Danise Edge, MD,as directed by  Danise Edge, MD while in the presence of Danise Edge, MD.  Danise Edge, MD

## 2021-09-10 NOTE — Assessment & Plan Note (Signed)
See AVS for some suggestions to manage this. Encouraged good sleep hygiene such as dark, quiet room. No blue/green glowing lights such as computer screens in bedroom. No alcohol or stimulants in evening. Cut down on caffeine as able. Regular exercise is helpful but not just prior to bed time. She can call for medical assistance if this persists or worsens

## 2021-09-11 LAB — URINE CULTURE
MICRO NUMBER:: 13702709
SPECIMEN QUALITY:: ADEQUATE

## 2021-09-14 NOTE — Addendum Note (Signed)
Addended by: Rosita Kea on: 09/14/2021 09:26 AM   Modules accepted: Orders

## 2021-10-20 ENCOUNTER — Ambulatory Visit (INDEPENDENT_AMBULATORY_CARE_PROVIDER_SITE_OTHER): Payer: Medicare Other | Admitting: *Deleted

## 2021-10-20 DIAGNOSIS — Z0000 Encounter for general adult medical examination without abnormal findings: Secondary | ICD-10-CM | POA: Diagnosis not present

## 2021-10-20 NOTE — Patient Instructions (Signed)
[name] [name] , Thank you for taking time to come for your Medicare Wellness Visit. I appreciate your ongoing commitment to your health goals. Please review the following plan we discussed and let me know if I can assist you in the future.   These are the goals we discussed:  Goals      Patient Stated     Maintain current health        This is a list of the screening recommended for you and due dates:  Health Maintenance  Topic Date Due   Complete foot exam   09/10/2015   Flu Shot  09/15/2021   Hemoglobin A1C  03/13/2022   Eye exam for diabetics  04/28/2022   Mammogram  12/03/2022   Colon Cancer Screening  06/24/2024   Tetanus Vaccine  02/22/2027   Pneumonia Vaccine  Completed   DEXA scan (bone density measurement)  Completed   Hepatitis C Screening: USPSTF Recommendation to screen - Ages 37-79 yo.  Completed   Zoster (Shingles) Vaccine  Completed   HPV Vaccine  Aged Out   COVID-19 Vaccine  Discontinued       Next appointment: Follow up in one year for your annual wellness visit 10/22/22   Preventive Care 65 Years and Older, Female Preventive care refers to lifestyle choices and visits with your health care provider that can promote health and wellness. What does preventive care include? A yearly physical exam. This is also called an annual well check. Dental exams once or twice a year. Routine eye exams. Ask your health care provider how often you should have your eyes checked. Personal lifestyle choices, including: Daily care of your teeth and gums. Regular physical activity. Eating a healthy diet. Avoiding tobacco and drug use. Limiting alcohol use. Practicing safe sex. Taking low-dose aspirin every day. Taking vitamin and mineral supplements as recommended by your health care provider. What happens during an annual well check? The services and screenings done by your health care provider during your annual well check will depend on your age, overall health,  lifestyle risk factors, and family history of disease. Counseling  Your health care provider may ask you questions about your: Alcohol use. Tobacco use. Drug use. Emotional well-being. Home and relationship well-being. Sexual activity. Eating habits. History of falls. Memory and ability to understand (cognition). Work and work Astronomer. Reproductive health. Screening  You may have the following tests or measurements: Height, weight, and BMI. Blood pressure. Lipid and cholesterol levels. These may be checked every 5 years, or more frequently if you are over 1 years old. Skin check. Lung cancer screening. You may have this screening every year starting at age 72 if you have a 30-pack-year history of smoking and currently smoke or have quit within the past 15 years. Fecal occult blood test (FOBT) of the stool. You may have this test every year starting at age 1. Flexible sigmoidoscopy or colonoscopy. You may have a sigmoidoscopy every 5 years or a colonoscopy every 10 years starting at age 69. Hepatitis C blood test. Hepatitis B blood test. Sexually transmitted disease (STD) testing. Diabetes screening. This is done by checking your blood sugar (glucose) after you have not eaten for a while (fasting). You may have this done every 1-3 years. Bone density scan. This is done to screen for osteoporosis. You may have this done starting at age 71. Mammogram. This may be done every 1-2 years. Talk to your health care provider about how often you should have regular mammograms. Talk  with your health care provider about your test results, treatment options, and if necessary, the need for more tests. Vaccines  Your health care provider may recommend certain vaccines, such as: Influenza vaccine. This is recommended every year. Tetanus, diphtheria, and acellular pertussis (Tdap, Td) vaccine. You may need a Td booster every 10 years. Zoster vaccine. You may need this after age  57. Pneumococcal 13-valent conjugate (PCV13) vaccine. One dose is recommended after age 40. Pneumococcal polysaccharide (PPSV23) vaccine. One dose is recommended after age 70. Talk to your health care provider about which screenings and vaccines you need and how often you need them. This information is not intended to replace advice given to you by your health care provider. Make sure you discuss any questions you have with your health care provider. Document Released: 02/28/2015 Document Revised: 10/22/2015 Document Reviewed: 12/03/2014 Elsevier Interactive Patient Education  2017 [place] Prevention in the Home Falls can cause injuries. They can happen to people of all ages. There are many things you can do to make your home safe and to help prevent falls. What can I do on the outside of my home? Regularly fix the edges of walkways and driveways and fix any cracks. Remove anything that might make you trip as you walk through a door, such as a raised step or threshold. Trim any bushes or trees on the path to your home. Use bright outdoor lighting. Clear any walking paths of anything that might make someone trip, such as rocks or tools. Regularly check to see if handrails are loose or broken. Make sure that both sides of any steps have handrails. Any raised decks and porches should have guardrails on the edges. Have any leaves, snow, or ice cleared regularly. Use sand or salt on walking paths during winter. Clean up any spills in your garage right away. This includes oil or grease spills. What can I do in the bathroom? Use night lights. Install grab bars by the toilet and in the tub and shower. Do not use towel bars as grab bars. Use non-skid mats or decals in the tub or shower. If you need to sit down in the shower, use a plastic, non-slip stool. Keep the floor dry. Clean up any water that spills on the floor as soon as it happens. Remove soap buildup in the tub or shower  regularly. Attach bath mats securely with double-sided non-slip rug tape. Do not have throw rugs and other things on the floor that can make you trip. What can I do in the bedroom? Use night lights. Make sure that you have a light by your bed that is easy to reach. Do not use any sheets or blankets that are too big for your bed. They should not hang down onto the floor. Have a firm chair that has side arms. You can use this for support while you get dressed. Do not have throw rugs and other things on the floor that can make you trip. What can I do in the kitchen? Clean up any spills right away. Avoid walking on wet floors. Keep items that you use a lot in easy-to-reach places. If you need to reach something above you, use a strong step stool that has a grab bar. Keep electrical cords out of the way. Do not use floor polish or wax that makes floors slippery. If you must use wax, use non-skid floor wax. Do not have throw rugs and other things on the floor that can make you  trip. What can I do with my stairs? Do not leave any items on the stairs. Make sure that there are handrails on both sides of the stairs and use them. Fix handrails that are broken or loose. Make sure that handrails are as long as the stairways. Check any carpeting to make sure that it is firmly attached to the stairs. Fix any carpet that is loose or worn. Avoid having throw rugs at the top or bottom of the stairs. If you do have throw rugs, attach them to the floor with carpet tape. Make sure that you have a light switch at the top of the stairs and the bottom of the stairs. If you do not have them, ask someone to add them for you. What else can I do to help prevent falls? Wear shoes that: Do not have high heels. Have rubber bottoms. Are comfortable and fit you well. Are closed at the toe. Do not wear sandals. If you use a stepladder: Make sure that it is fully opened. Do not climb a closed stepladder. Make sure that  both sides of the stepladder are locked into place. Ask someone to hold it for you, if possible. Clearly mark and make sure that you can see: Any grab bars or handrails. First and last steps. Where the edge of each step is. Use tools that help you move around (mobility aids) if they are needed. These include: Canes. Walkers. Scooters. Crutches. Turn on the lights when you go into a dark area. Replace any light bulbs as soon as they burn out. Set up your furniture so you have a clear path. Avoid moving your furniture around. If any of your floors are uneven, fix them. If there are any pets around you, be aware of where they are. Review your medicines with your doctor. Some medicines can make you feel dizzy. This can increase your chance of falling. Ask your doctor what other things that you can do to help prevent falls. This information is not intended to replace advice given to you by your health care provider. Make sure you discuss any questions you have with your health care provider. Document Released: 11/28/2008 Document Revised: 07/10/2015 Document Reviewed: 03/08/2014 Elsevier Interactive Patient Education  2017 [employer].

## 2021-10-20 NOTE — Progress Notes (Signed)
Subjective:   [name] [name] is a 66 y.o. female who presents for Medicare Annual (Subsequent) preventive examination.  I connected with  [name] [name] on 10/20/21 by a audio enabled telemedicine application and verified that I am speaking with the correct person using two identifiers.  Patient Location: Home  Provider Location: Office/Clinic  I discussed the limitations of evaluation and management by telemedicine. The patient expressed understanding and agreed to proceed.   Review of Systems    Defer to PCP Cardiac Risk Factors include: advanced age (>4men, >67 women);diabetes mellitus;hypertension     Objective:    There were no vitals filed for this visit. There is no height or weight on file to calculate BMI.     10/20/2021    8:26 AM 10/16/2020    8:24 AM 10/29/2011    1:05 PM 03/01/2011   10:58 AM  Advanced Directives  Does Patient Have a Medical Advance Directive? Yes Yes Patient has advance directive, copy not in chart Patient has advance directive, copy not in chart  Type of Advance Directive Healthcare Power of [place];Living will Healthcare Power of [place];Living will    Does patient want to make changes to medical advance directive? No - Patient declined     Copy of Healthcare Power of Attorney in Chart? No - copy requested No - copy requested      Current Medications (verified) Outpatient Encounter Medications as of 10/20/2021  Medication Sig   amLODipine (NORVASC) 5 MG tablet TAKE 1 TABLET BY MOUTH DAILY   Ascorbic Acid (VITAMIN C) 1000 MG tablet Take 1,000 mg by mouth daily.   aspirin 81 MG EC tablet Take 81 mg by mouth daily.   calcium carbonate (OS-CAL - DOSED IN MG OF ELEMENTAL CALCIUM) 1250 (500 Ca) MG tablet Take 1 tablet by mouth daily.   glipiZIDE (GLUCOTROL XL) 10 MG 24 hr tablet TAKE 2 TABLETS BY MOUTH EVERY DAY WITH BREAKFAST   levothyroxine (SYNTHROID) 75 MCG tablet TAKE 1 TABLET BY MOUTH EVERY DAY BEFORE BREAKFAST.   lisinopril (ZESTRIL) 2.5  MG tablet TAKE 1 TABLET BY MOUTH DAILY GENERIC EQUIVALENT FOR ZESTRIL   meclizine (ANTIVERT) 25 MG tablet Take 1 tablet (25 mg total) by mouth 3 (three) times daily as needed for dizziness.   Multiple Vitamin (MULTIVITAMIN) tablet Take 1 tablet by mouth daily.   rosuvastatin (CRESTOR) 5 MG tablet TAKE 1 TABLET(5 MG TOTAL) BY MOUTH DAILY   sitaGLIPtin (JANUVIA) 100 MG tablet Take 1 tablet (100 mg total) by mouth daily.   triamcinolone cream (KENALOG) 0.1 % Apply 1 application topically 2 (two) times daily.   No facility-administered encounter medications on file as of 10/20/2021.    Allergies (verified) Codeine and Tramadol   History: Past Medical History:  Diagnosis Date   Back pain 06/13/2015   Diabetes mellitus    started Metformin 12/2012   History of shingles 2012   back   Hyperlipidemia    Hypertension    Hypothyroidism 07/29/2010   S/p FNA showing non-neoplastic goiter 7/12 w/ Dr Pollyann Kennedy    Obesity 08/06/2016   Osteoporosis 08/06/2016   Vitamin D deficiency 08/06/2016   Past Surgical History:  Procedure Laterality Date   ABDOMINAL HYSTERECTOMY  1997   APPENDECTOMY     MASS EXCISION  03/02/2011   Procedure: EXCISION MASS;  Surgeon: Nicki Reaper, MD;  Location: [facility];  Service: Orthopedics;  Laterality: Right;  Excision Cyst Interphangeal Right Thumb   OOPHORECTOMY     SHOULDER  ARTHROSCOPY W/ ROTATOR CUFF REPAIR  2007   rt   TONSILLECTOMY     TRIGGER FINGER RELEASE  11/01/2011   Procedure: RELEASE TRIGGER FINGER/A-1 PULLEY;  Surgeon: Nicki Reaper, MD;  Location: [facility];  Service: Orthopedics;  Laterality: Right;  right thumb, possible excision cyst   Family History  Problem Relation Age of Onset   Hypertension Mother    Diabetes Mother    Breast cancer Mother 50   Other Mother    Hypertension Father    Cancer Father        throat   Alcohol abuse Father    Hypertension Brother    Heart disease Brother        heart murmur   Alcohol  abuse Brother    Cancer Maternal Grandmother    Heart disease Maternal Grandfather    Heart disease Paternal Grandmother    Cancer Maternal Aunt    Breast cancer Maternal Aunt    Social History   Socioeconomic History   Marital status: Married    Spouse name: Not on file   Number of children: Not on file   Years of education: Not on file   Highest education level: Not on file  Occupational History   Occupation: Physiological scientist  Tobacco Use   Smoking status: Never   Smokeless tobacco: Never  Substance and Sexual Activity   Alcohol use: No   Drug use: No   Sexual activity: Yes    Partners: Male    Comment: lives with husband, works at [employer], no major dietary   Other Topics Concern   Not on file  Social History Narrative   Married; step daughter   Regular exercise: walk 2 days a week   Caffeine use: 2 cups of coffee daily   Social Determinants of Health   Financial Resource Strain: Low Risk  (10/16/2020)   Overall Financial Resource Strain (CARDIA)    Difficulty of Paying Living Expenses: Not hard at all  Food Insecurity: No Food Insecurity (10/16/2020)   Hunger Vital Sign    Worried About Running Out of Food in the Last Year: Never true    Ran Out of Food in the Last Year: Never true  Transportation Needs: No Transportation Needs (10/16/2020)   PRAPARE - Administrator, Civil Service (Medical): No    Lack of Transportation (Non-Medical): No  Physical Activity: Inactive (10/16/2020)   Exercise Vital Sign    Days of Exercise per Week: 0 days    Minutes of Exercise per Session: 0 min  Stress: No Stress Concern Present (10/16/2020)   [employer] of Occupational Health - Occupational Stress Questionnaire    Feeling of Stress : Not at all  Social Connections: Moderately Integrated (10/16/2020)   Social Connection and Isolation Panel [NHANES]    Frequency of Communication with Friends and Family: More than three times a week    Frequency of Social  Gatherings with Friends and Family: More than three times a week    Attends Religious Services: More than 4 times per year    Active Member of [employer] or Organizations: No    Attends Engineer, structural: Never    Marital Status: Married    Tobacco Counseling Counseling given: Not Answered   Clinical Intake:  Pre-visit preparation completed: Yes  Pain : No/denies pain     Diabetes: Yes CBG done?: No Did pt. bring in CBG monitor from home?: No  How often do you need  to have someone help you when you read instructions, pamphlets, or other written materials from your doctor or pharmacy?: 1 - Never  Diabetic? Yes Nutrition Risk Assessment:  Has the patient had any N/V/D within the last 2 months?  No  Does the patient have any non-healing wounds?  No  Has the patient had any unintentional weight loss or weight gain?  No   Diabetes:  Is the patient diabetic?  Yes  If diabetic, was a CBG obtained today?  No  Did the patient bring in their glucometer from home?  No  How often do you monitor your CBG's? never.   Financial Strains and Diabetes Management:  Are you having any financial strains with the device, your supplies or your medication? No .  Does the patient want to be seen by Chronic Care Management for management of their diabetes?  No  Would the patient like to be referred to a Nutritionist or for Diabetic Management?  No   Diabetic Exams:  Diabetic Eye Exam: Completed 04/27/21 Diabetic Foot Exam: Overdue, Pt has been advised about the importance in completing this exam. Pt is scheduled for diabetic foot exam on N/a.   Interpreter Needed?: No  Information entered by :: Donne Anon, CMA   Activities of Daily Living    10/20/2021    8:27 AM  In your present state of health, do you have any difficulty performing the following activities:  Hearing? 0  Vision? 0  Difficulty concentrating or making decisions? 0  Walking or climbing stairs? 0  Dressing or  bathing? 0  Doing errands, shopping? 0  Preparing Food and eating ? N  Using the Toilet? N  In the past six months, have you accidently leaked urine? N  Do you have problems with loss of bowel control? N  Managing your Medications? N  Managing your Finances? N  Housekeeping or managing your Housekeeping? N    Patient Care Team: Bradd Canary, MD as PCP - General (Family Medicine) Dorena Cookey, MD (Inactive) as Consulting Physician (Gastroenterology) Arminda Resides, MD as Consulting Physician (Dermatology)  Indicate any recent Medical Services you may have received from other than [facility] providers in the past year (date may be approximate).     Assessment:   This is a routine wellness examination for Danaria.  Hearing/Vision screen No results found.  Dietary issues and exercise activities discussed: Current Exercise Habits: Home exercise routine, Type of exercise: walking;Other - see comments (maintains 3 acres), Time (Minutes): > 60, Frequency (Times/Week): 2, Weekly Exercise (Minutes/Week): 0, Intensity: Mild, Exercise limited by: None identified   Goals Addressed   None    Depression Screen    10/20/2021    8:27 AM 09/10/2021    9:11 AM 10/16/2020    8:26 AM 08/04/2020    8:19 AM 03/09/2019   10:27 AM 02/21/2017   10:03 AM 10/12/2013    8:07 AM  PHQ 2/9 Scores  PHQ - 2 Score 0 6 0 0 0 0 0  PHQ- 9 Score  20  0 0      Fall Risk    10/20/2021    8:27 AM 09/10/2021    9:06 AM 10/16/2020    8:25 AM 08/04/2020    8:28 AM 01/29/2020    3:00 PM  Fall Risk   Falls in the past year? 0 0 0 1 0  Number falls in past yr: 0 0 0 1 0  Injury with Fall? 0 0 0 0 0  Risk  for fall due to : No Fall Risks No Fall Risks     Follow up Falls evaluation completed Falls evaluation completed Falls prevention discussed      FALL RISK PREVENTION PERTAINING TO THE HOME:  Any stairs in or around the home? Yes  If so, are there any without handrails? Yes  Home free of loose throw rugs in walkways,  pet beds, electrical cords, etc? Yes  Adequate lighting in your home to reduce risk of falls? Yes   ASSISTIVE DEVICES UTILIZED TO PREVENT FALLS:  Life alert? No  Use of a cane, walker or w/c? No  Grab bars in the bathroom? No  Shower chair or bench in shower? Yes  Elevated toilet seat or a handicapped toilet? Yes   Cognitive Function:        10/20/2021    8:34 AM  6CIT Screen  What Year? 0 points  What month? 0 points  What time? 0 points  Count back from 20 0 points  Months in reverse 0 points  Repeat phrase 0 points  Total Score 0 points    Immunizations Immunization History  Administered Date(s) Administered   Influenza,inj,Quad PF,6+ Mos 12/05/2012, 12/13/2013, 03/11/2015, 12/15/2017   Influenza,inj,quad, With Preservative 12/20/2016, 11/22/2018   Influenza-Unspecified 12/20/2016, 12/31/2019, 12/17/2020   PFIZER(Purple Top)SARS-COV-2 Vaccination 03/27/2019, 04/21/2019, 01/30/2020   Pneumococcal Conjugate-13 06/13/2015, 03/09/2021   Pneumococcal Polysaccharide-23 02/15/2009, 01/29/2020   Td 02/16/2007   Tdap 02/21/2017   Zoster Recombinat (Shingrix) 02/21/2017, 08/22/2017   Zoster, Live 06/13/2015    TDAP status: Up to date  Flu Vaccine status: Up to date  Pneumococcal vaccine status: Up to date  Covid-19 vaccine status: Information provided on how to obtain vaccines.   Qualifies for Shingles Vaccine? Yes   Zostavax completed Yes   Shingrix Completed?: Yes  Screening Tests Health Maintenance  Topic Date Due   FOOT EXAM  09/10/2015   INFLUENZA VACCINE  09/15/2021   HEMOGLOBIN A1C  03/13/2022   OPHTHALMOLOGY EXAM  04/28/2022   MAMMOGRAM  12/03/2022   COLONOSCOPY (Pts 45-1779yrs Insurance coverage will need to be confirmed)  06/24/2024   TETANUS/TDAP  02/22/2027   Pneumonia Vaccine 465+ Years old  Completed   DEXA SCAN  Completed   Hepatitis C Screening  Completed   Zoster Vaccines- Shingrix  Completed   HPV VACCINES  Aged Out   COVID-19 Vaccine   Discontinued    Health Maintenance  Health Maintenance Due  Topic Date Due   FOOT EXAM  09/10/2015   INFLUENZA VACCINE  09/15/2021    Colorectal cancer screening: Type of screening: Colonoscopy. Completed 06/25/19. Repeat every 5 years  Mammogram status: Completed 12/02/20. Repeat every year  Bone Density status: Completed 03/18/20. Results reflect: Bone density results: OSTEOPOROSIS. Repeat every 2 years.  Lung Cancer Screening: (Low Dose CT Chest recommended if Age 96-80 years, 30 pack-year currently smoking OR have quit w/in 15years.) does not qualify.   Lung Cancer Screening Referral: N/a  Additional Screening:  Hepatitis C Screening: does qualify; Completed 03/11/2015  Vision Screening: Recommended annual ophthalmology exams for early detection of glaucoma and other disorders of the eye. Is the patient up to date with their annual eye exam?  Yes  Who is the provider or what is the name of the office in which the patient attends annual eye exams? Dr. Nile RiggsShapiro If pt is not established with a provider, would they like to be referred to a provider to establish care? No .   Dental Screening: Recommended annual dental  exams for proper oral hygiene  Community Resource Referral / Chronic Care Management: CRR required this visit?  No   CCM required this visit?  No      Plan:     I have personally reviewed and noted the following in the patient's chart:   Medical and social history Use of alcohol, tobacco or illicit drugs  Current medications and supplements including opioid prescriptions. Patient is not currently taking opioid prescriptions. Functional ability and status Nutritional status Physical activity Advanced directives List of other physicians Hospitalizations, surgeries, and ER visits in previous 12 months Vitals Screenings to include cognitive, depression, and falls Referrals and appointments  In addition, I have reviewed and discussed with patient certain  preventive protocols, quality metrics, and best practice recommendations. A written personalized care plan for preventive services as well as general preventive health recommendations were provided to patient.   Due to this being a telephonic visit, the after visit summary with patients personalized plan was offered to patient via mail or my-chart. Patient would like to access on my-chart.   Donne Anon, [place]   10/20/2021   Nurse Notes: None

## 2021-10-21 ENCOUNTER — Other Ambulatory Visit: Payer: Self-pay | Admitting: Family Medicine

## 2021-10-21 DIAGNOSIS — Z1231 Encounter for screening mammogram for malignant neoplasm of breast: Secondary | ICD-10-CM

## 2021-10-22 ENCOUNTER — Other Ambulatory Visit: Payer: Self-pay

## 2021-10-22 DIAGNOSIS — E119 Type 2 diabetes mellitus without complications: Secondary | ICD-10-CM

## 2021-10-22 MED ORDER — GLIPIZIDE ER 10 MG PO TB24: ORAL_TABLET | ORAL | 1 refills | Status: DC

## 2021-10-30 ENCOUNTER — Other Ambulatory Visit: Payer: Self-pay

## 2021-10-30 ENCOUNTER — Telehealth: Payer: Self-pay | Admitting: Family Medicine

## 2021-10-30 ENCOUNTER — Telehealth: Payer: Self-pay

## 2021-10-30 MED ORDER — LEVOTHYROXINE SODIUM 75 MCG PO TABS: ORAL_TABLET | ORAL | 1 refills | Status: DC

## 2021-10-30 NOTE — Telephone Encounter (Signed)
[name] [name] with [facility] calling to check status of the Levothyroxine 75 mcg request. Advised medication has already been filled at another pharmacy. [name] [name] would like for [place] to call the patient to find out if she desires prescription to be filled at [facility] or the current pharmacy. [name] [name] explained medication is not on auto refill which means patient had to call it in.   If the patient does not want it filled at [facility], please call [phone] so they can cancel request. If she prefers it sent to them, please send in refill request.

## 2021-10-30 NOTE — Telephone Encounter (Signed)
Done

## 2021-10-30 NOTE — Telephone Encounter (Signed)
Rx was sent  

## 2021-11-02 ENCOUNTER — Other Ambulatory Visit: Payer: Self-pay

## 2021-11-02 ENCOUNTER — Telehealth: Payer: Self-pay | Admitting: Family Medicine

## 2021-11-02 DIAGNOSIS — E119 Type 2 diabetes mellitus without complications: Secondary | ICD-10-CM

## 2021-11-02 MED ORDER — SITAGLIPTIN PHOSPHATE 100 MG PO TABS: 100.0000 mg | ORAL_TABLET | Freq: Every day | ORAL | 3 refills | Status: DC

## 2021-11-02 NOTE — Telephone Encounter (Signed)
Rx sent 

## 2021-11-02 NOTE — Telephone Encounter (Signed)
Medication: sitaGLIPtin (JANUVIA) 100 MG tablet  Has the patient contacted their pharmacy? Yes.     Preferred Pharmacy (with phone number or street name):  [facility], [place]  [address] [place] [address]  Phone:  [phone]  Fax:  [phone]

## 2021-12-03 ENCOUNTER — Ambulatory Visit
Admission: RE | Admit: 2021-12-03 | Discharge: 2021-12-03 | Disposition: A | Discharge status: Home / Self Care | Payer: Medicare Other | Source: Ambulatory Visit | Attending: Family Medicine | Admitting: Family Medicine

## 2021-12-03 DIAGNOSIS — Z1231 Encounter for screening mammogram for malignant neoplasm of breast: Secondary | ICD-10-CM

## 2021-12-30 NOTE — Assessment & Plan Note (Signed)
Well controlled, no changes to meds. Encouraged heart healthy diet such as the DASH diet and exercise as tolerated.  °

## 2021-12-30 NOTE — Assessment & Plan Note (Signed)
On Levothyroxine, continue to monitor 

## 2021-12-30 NOTE — Assessment & Plan Note (Signed)
Supplement and monitor 

## 2021-12-30 NOTE — Assessment & Plan Note (Addendum)
hgba1c acceptable, minimize simple carbs. Increase exercise as tolerated. Continue current meds  RSV (respiratory syncitial virus) vaccine at pharmacy, Arexvy Covid booster at pharmacy High dose flu shot

## 2021-12-30 NOTE — Assessment & Plan Note (Signed)
Encourage heart healthy diet such as MIND or DASH diet, increase exercise, avoid trans fats, simple carbohydrates and processed foods, consider a krill or fish or flaxseed oil cap daily.  Tolerating Rosuvastatin 

## 2021-12-31 ENCOUNTER — Other Ambulatory Visit: Payer: Self-pay | Admitting: Family Medicine

## 2021-12-31 ENCOUNTER — Encounter: Payer: Self-pay | Admitting: Family Medicine

## 2021-12-31 ENCOUNTER — Ambulatory Visit (INDEPENDENT_AMBULATORY_CARE_PROVIDER_SITE_OTHER): Payer: Medicare Other | Admitting: Family Medicine

## 2021-12-31 VITALS — BP 128/80 | HR 64 | Temp 98.2°F | Resp 12 | Ht 67.5 in | Wt 200.4 lb

## 2021-12-31 DIAGNOSIS — F4320 Adjustment disorder, unspecified: Secondary | ICD-10-CM

## 2021-12-31 DIAGNOSIS — E038 Other specified hypothyroidism: Secondary | ICD-10-CM

## 2021-12-31 DIAGNOSIS — Z23 Encounter for immunization: Secondary | ICD-10-CM | POA: Diagnosis not present

## 2021-12-31 DIAGNOSIS — E119 Type 2 diabetes mellitus without complications: Secondary | ICD-10-CM

## 2021-12-31 DIAGNOSIS — I10 Essential (primary) hypertension: Secondary | ICD-10-CM | POA: Diagnosis not present

## 2021-12-31 DIAGNOSIS — E785 Hyperlipidemia, unspecified: Secondary | ICD-10-CM

## 2021-12-31 DIAGNOSIS — F4321 Adjustment disorder with depressed mood: Secondary | ICD-10-CM

## 2021-12-31 DIAGNOSIS — M79652 Pain in left thigh: Secondary | ICD-10-CM | POA: Insufficient documentation

## 2021-12-31 DIAGNOSIS — E559 Vitamin D deficiency, unspecified: Secondary | ICD-10-CM

## 2021-12-31 LAB — CBC
HCT: 44.1 % (ref 36.0–46.0)
Hemoglobin: 14.7 g/dL (ref 12.0–15.0)
MCHC: 33.4 g/dL (ref 30.0–36.0)
MCV: 86.4 fl (ref 78.0–100.0)
Platelets: 243 10*3/uL (ref 150.0–400.0)
RBC: 5.1 Mil/uL (ref 3.87–5.11)
RDW: 13 % (ref 11.5–15.5)
WBC: 12.9 10*3/uL — ABNORMAL HIGH (ref 4.0–10.5)

## 2021-12-31 LAB — COMPREHENSIVE METABOLIC PANEL
ALT: 19 U/L (ref 0–35)
AST: 17 U/L (ref 0–37)
Albumin: 4.6 g/dL (ref 3.5–5.2)
Alkaline Phosphatase: 69 U/L (ref 39–117)
BUN: 15 mg/dL (ref 6–23)
CO2: 30 mEq/L (ref 19–32)
Calcium: 9.5 mg/dL (ref 8.4–10.5)
Chloride: 100 mEq/L (ref 96–112)
Creatinine, Ser: 0.82 mg/dL (ref 0.40–1.20)
GFR: 73.76 mL/min (ref >60.00)
Glucose, Bld: 156 mg/dL — ABNORMAL HIGH (ref 70–99)
Potassium: 4 mEq/L (ref 3.5–5.1)
Sodium: 137 mEq/L (ref 135–145)
Total Bilirubin: 0.5 mg/dL (ref 0.2–1.2)
Total Protein: 6.7 g/dL (ref 6.0–8.3)

## 2021-12-31 LAB — LIPID PANEL
Cholesterol: 156 mg/dL (ref 0–200)
HDL: 68.3 mg/dL (ref >39.00)
LDL Cholesterol: 76 mg/dL (ref 0–99)
NonHDL: 87.67
Total CHOL/HDL Ratio: 2
Triglycerides: 56 mg/dL (ref 0.0–149.0)
VLDL: 11.2 mg/dL (ref 0.0–40.0)

## 2021-12-31 LAB — HEMOGLOBIN A1C: Hgb A1c MFr Bld: 7.6 % — ABNORMAL HIGH (ref 4.6–6.5)

## 2021-12-31 LAB — VITAMIN D 25 HYDROXY (VIT D DEFICIENCY, FRACTURES): VITD: 38.14 ng/mL (ref 30.00–100.00)

## 2021-12-31 LAB — TSH: TSH: 2.77 u[IU]/mL (ref 0.35–5.50)

## 2021-12-31 NOTE — Assessment & Plan Note (Signed)
Lost her husband in June of this year. She is not interested in meds, no counseling. She has a good support system but she will let us know if she decides she is ready for formal treatment

## 2021-12-31 NOTE — Progress Notes (Signed)
Subjective:   By signing my name below, I, [name] [name], attest that this documentation has been prepared under the direction and in the presence of Bradd Canary, MD., 12/31/2021.     Patient ID: [name] [name], female    DOB: [birth year], 66 y.o.   MRN: [id number]  Chief Complaint  Patient presents with   3 month follow up    HPI Patient is in today for an office visit.  Depression/Grief: Patient is visibly upset during today's visit and is grieving the loss of her husband who passed away on Aug 10, 2021. She is not interested in taking medications to manage her depression.  Diabetes Mellitus (Type II): Patient is interested in receiving a diabetic foot exam today. Her last HGBA1C was elevated but she is attempting to balance her diet and lose weight.  Lab Results  Component Value Date   HGBA1C 7.6 (H) 12/31/2021   Wt Readings from Last 3 Encounters:  12/31/21 200 lb 6.4 oz (90.9 kg)  09/10/21 194 lb (88 kg)  06/22/21 187 lb 12.8 oz (85.2 kg)   Immunizations: Patient received the Influenza immunization today.  Immunization History  Administered Date(s) Administered   Influenza,inj,Quad PF,6+ Mos 12/05/2012, 12/13/2013, 03/11/2015, 12/15/2017, 12/31/2021   Influenza,inj,quad, With Preservative 12/20/2016, 11/22/2018   Influenza-Unspecified 12/20/2016, 12/31/2019, 12/17/2020   PFIZER(Purple Top)SARS-COV-2 Vaccination 03/27/2019, 04/21/2019, 01/30/2020   Pneumococcal Conjugate-13 06/13/2015, 03/09/2021   Pneumococcal Polysaccharide-23 02/15/2009, 01/29/2020   Td 02/16/2007   Tdap 02/21/2017   Zoster Recombinat (Shingrix) 02/21/2017, 08/22/2017   Zoster, Live 06/13/2015   Left Leg Pain: Patient reports having isolated and intermittent pain in the left lateral thigh. She states the the pain occurs daily and when arising. She describes the pain as 7/10 which lowers to 2/10 when she is active. Tylenol provides temporary relief. She is interested in taking Meloxicam and  receiving a sports medicine referral.  Past Medical History:  Diagnosis Date   Back pain 06/13/2015   Diabetes mellitus    started Metformin 12/2012   History of shingles 2012   back   Hyperlipidemia    Hypertension    Hypothyroidism 07/29/2010   S/p FNA showing non-neoplastic goiter 7/12 w/ Dr Pollyann Kennedy    Obesity 08/06/2016   Osteoporosis 08/06/2016   Vitamin D deficiency 08/06/2016   Past Surgical History:  Procedure Laterality Date   ABDOMINAL HYSTERECTOMY  1997   APPENDECTOMY     MASS EXCISION  03/02/2011   Procedure: EXCISION MASS;  Surgeon: Nicki Reaper, MD;  Location: [facility];  Service: Orthopedics;  Laterality: Right;  Excision Cyst Interphangeal Right Thumb   OOPHORECTOMY     SHOULDER ARTHROSCOPY W/ ROTATOR CUFF REPAIR  2007   rt   TONSILLECTOMY     TRIGGER FINGER RELEASE  11/01/2011   Procedure: RELEASE TRIGGER FINGER/A-1 PULLEY;  Surgeon: Nicki Reaper, MD;  Location: [facility];  Service: Orthopedics;  Laterality: Right;  right thumb, possible excision cyst   Family History  Problem Relation Age of Onset   Hypertension Mother    Diabetes Mother    Breast cancer Mother 17   Other Mother    Hypertension Father    Cancer Father        throat   Alcohol abuse Father    Hypertension Brother    Heart disease Brother        heart murmur   Alcohol abuse Brother    Cancer Maternal Grandmother    Heart disease Maternal Grandfather  Heart disease Paternal Grandmother    Cancer Maternal Aunt    Breast cancer Maternal Aunt    Social History   Socioeconomic History   Marital status: Widowed    Spouse name: Not on file   Number of children: Not on file   Years of education: Not on file   Highest education level: Not on file  Occupational History   Occupation: Physiological scientistoffice administrator  Tobacco Use   Smoking status: Never   Smokeless tobacco: Never  Substance and Sexual Activity   Alcohol use: No   Drug use: No   Sexual activity: Yes     Partners: Male    Comment: lives with husband, works at [employer], no major dietary   Other Topics Concern   Not on file  Social History Narrative   Widowed; step daughterRegular exercise: walk 2 days a weekCaffeine use: 2 cups of coffee daily   Social Determinants of Health   Financial Resource Strain: Low Risk  (10/16/2020)   Overall Financial Resource Strain (CARDIA)    Difficulty of Paying Living Expenses: Not hard at all  Food Insecurity: No Food Insecurity (10/16/2020)   Hunger Vital Sign    Worried About Running Out of Food in the Last Year: Never true    Ran Out of Food in the Last Year: Never true  Transportation Needs: No Transportation Needs (10/16/2020)   PRAPARE - Administrator, Civil ServiceTransportation    Lack of Transportation (Medical): No    Lack of Transportation (Non-Medical): No  Physical Activity: Inactive (10/16/2020)   Exercise Vital Sign    Days of Exercise per Week: 0 days    Minutes of Exercise per Session: 0 min  Stress: No Stress Concern Present (10/16/2020)   [employer] - Occupational Stress Questionnaire    Feeling of Stress : Not at all  Social Connections: Moderately Integrated (10/16/2020)   Social Connection and Isolation Panel [NHANES]    Frequency of Communication with Friends and Family: More than three times a week    Frequency of Social Gatherings with Friends and Family: More than three times a week    Attends Religious Services: More than 4 times per year    Active Member of [employer] Financial[employer]ubs or Organizations: No    Attends BankerClub or Organization Meetings: Never    Marital Status: Married  Catering managerntimate Partner Violence: Not At Risk (10/16/2020)   Humiliation, Afraid, Rape, and Kick questionnaire    Fear of Current or Ex-Partner: No    Emotionally Abused: No    Physically Abused: No    Sexually Abused: No   Outpatient Medications Prior to Visit  Medication Sig Dispense Refill   amLODipine (NORVASC) 5 MG tablet TAKE 1 TABLET BY MOUTH DAILY 90 tablet 0    Ascorbic Acid (VITAMIN C) 1000 MG tablet Take 1,000 mg by mouth daily.     aspirin 81 MG EC tablet Take 81 mg by mouth daily.     calcium carbonate (OS-CAL - DOSED IN MG OF ELEMENTAL CALCIUM) 1250 (500 Ca) MG tablet Take 1 tablet by mouth daily.     levothyroxine (SYNTHROID) 75 MCG tablet TAKE 1 TABLET BY MOUTH EVERY DAY BEFORE BREAKFAST. 90 tablet 1   lisinopril (ZESTRIL) 2.5 MG tablet TAKE 1 TABLET BY MOUTH DAILY GENERIC EQUIVALENT FOR ZESTRIL 90 tablet 1   meclizine (ANTIVERT) 25 MG tablet Take 1 tablet (25 mg total) by mouth 3 (three) times daily as needed for dizziness. 30 tablet 1   Multiple Vitamin (MULTIVITAMIN) tablet Take  1 tablet by mouth daily.     rosuvastatin (CRESTOR) 5 MG tablet TAKE 1 TABLET(5 MG TOTAL) BY MOUTH DAILY 90 tablet 2   sitaGLIPtin (JANUVIA) 100 MG tablet Take 1 tablet (100 mg total) by mouth daily. 30 tablet 3   triamcinolone cream (KENALOG) 0.1 % Apply 1 application topically 2 (two) times daily. 80 g 2   glipiZIDE (GLUCOTROL XL) 10 MG 24 hr tablet TAKE 2 TABLETS BY MOUTH EVERY DAY WITH BREAKFAST 180 tablet 1   No facility-administered medications prior to visit.   Allergies  Allergen Reactions   Codeine    Tramadol    Review of Systems  Constitutional:  Negative for fever and malaise/fatigue.  HENT:  Negative for congestion.   Eyes:  Negative for blurred vision.  Respiratory:  Negative for shortness of breath.   Cardiovascular:  Negative for chest pain, palpitations and leg swelling.  Gastrointestinal:  Negative for abdominal pain, blood in stool and nausea.  Genitourinary:  Negative for dysuria and frequency.  Musculoskeletal:  Negative for falls.       (+) left lateral thigh pain.  Skin:  Negative for rash.  Neurological:  Negative for dizziness, loss of consciousness and headaches.  Endo/Heme/Allergies:  Negative for environmental allergies.  Psychiatric/Behavioral:  Positive for depression. The patient is not nervous/anxious.       Objective:     Physical Exam Constitutional:      General: She is not in acute distress.    Appearance: Normal appearance. She is obese. She is not ill-appearing.  HENT:     Head: Normocephalic and atraumatic.     Right Ear: External ear normal.     Left Ear: External ear normal.     Mouth/Throat:     Mouth: Mucous membranes are moist.     Pharynx: Oropharynx is clear.  Eyes:     Extraocular Movements: Extraocular movements intact.     Pupils: Pupils are equal, round, and reactive to light.  Cardiovascular:     Rate and Rhythm: Normal rate and regular rhythm.     Pulses: Normal pulses.     Heart sounds: Normal heart sounds. No murmur heard.    No gallop.  Pulmonary:     Effort: Pulmonary effort is normal. No respiratory distress.     Breath sounds: Normal breath sounds. No wheezing or rales.  Abdominal:     General: Bowel sounds are normal.  Skin:    General: Skin is warm and dry.  Neurological:     Mental Status: She is alert and oriented to person, place, and time.  Psychiatric:        Mood and Affect: Mood normal.        Behavior: Behavior normal.        Judgment: Judgment normal.    BP 128/80 (BP Location: Left Arm, Cuff Size: Large)   Pulse 64   Temp 98.2 F (36.8 C) (Oral)   Resp 12   Ht 5' 7.5" (1.715 m)   Wt 200 lb 6.4 oz (90.9 kg)   SpO2 99%   BMI 30.92 kg/m  Wt Readings from Last 3 Encounters:  12/31/21 200 lb 6.4 oz (90.9 kg)  09/10/21 194 lb (88 kg)  06/22/21 187 lb 12.8 oz (85.2 kg)   Diabetic Foot Exam - Simple   No data filed    Lab Results  Component Value Date   WBC 12.9 (H) 12/31/2021   HGB 14.7 12/31/2021   HCT 44.1 12/31/2021   PLT 243.0  12/31/2021   GLUCOSE 156 (H) 12/31/2021   CHOL 156 12/31/2021   TRIG 56.0 12/31/2021   HDL 68.30 12/31/2021   LDLCALC 76 12/31/2021   ALT 19 12/31/2021   AST 17 12/31/2021   NA 137 12/31/2021   K 4.0 12/31/2021   CL 100 12/31/2021   CREATININE 0.82 12/31/2021   BUN 15 12/31/2021   CO2 30 12/31/2021   TSH  2.77 12/31/2021   HGBA1C 7.6 (H) 12/31/2021   MICROALBUR <0.7 09/10/2021   Lab Results  Component Value Date   TSH 2.77 12/31/2021   Lab Results  Component Value Date   WBC 12.9 (H) 12/31/2021   HGB 14.7 12/31/2021   HCT 44.1 12/31/2021   MCV 86.4 12/31/2021   PLT 243.0 12/31/2021   Lab Results  Component Value Date   NA 137 12/31/2021   K 4.0 12/31/2021   CO2 30 12/31/2021   GLUCOSE 156 (H) 12/31/2021   BUN 15 12/31/2021   CREATININE 0.82 12/31/2021   BILITOT 0.5 12/31/2021   ALKPHOS 69 12/31/2021   AST 17 12/31/2021   ALT 19 12/31/2021   PROT 6.7 12/31/2021   ALBUMIN 4.6 12/31/2021   CALCIUM 9.5 12/31/2021   GFR 73.76 12/31/2021   Lab Results  Component Value Date   CHOL 156 12/31/2021   Lab Results  Component Value Date   HDL 68.30 12/31/2021   Lab Results  Component Value Date   LDLCALC 76 12/31/2021   Lab Results  Component Value Date   TRIG 56.0 12/31/2021   Lab Results  Component Value Date   CHOLHDL 2 12/31/2021   Lab Results  Component Value Date   HGBA1C 7.6 (H) 12/31/2021      Assessment & Plan:   Problem List Items Addressed This Visit     Adjustment reaction   Essential hypertension    Well controlled, no changes to meds. Encouraged heart healthy diet such as the DASH diet and exercise as tolerated.        Relevant Orders   Comprehensive metabolic panel (Completed)   CBC (Completed)   Hypothyroidism    On Levothyroxine, continue to monitor       Relevant Orders   TSH (Completed)   DM type 2 (diabetes mellitus, type 2) (HCC) - Primary    hgba1c acceptable, minimize simple carbs. Increase exercise as tolerated. Continue current meds  RSV (respiratory syncitial virus) vaccine at pharmacy, Arexvy Covid booster at pharmacy High dose flu shot      Relevant Orders   Hemoglobin A1c (Completed)   Hyperlipidemia with target LDL less than 100    Encourage heart healthy diet such as MIND or DASH diet, increase exercise, avoid  trans fats, simple carbohydrates and processed foods, consider a krill or fish or flaxseed oil cap daily.  Tolerating Rosuvastatin      Relevant Orders   Comprehensive metabolic panel (Completed)   Lipid panel (Completed)   Vitamin D deficiency    Supplement and monitor       Grief reaction    Lost her husband in June of this year. She is not interested in meds, no counseling. She has a good support system but she will let us know if she decides she is ready for formal treatment      Pain in left thigh    Off and on for several months. Occurs with arising and can be 7 of 10 initaally but as she moves around it improves to 2 of 10. Try Meloxicam 7.5  mg daily x 2 weeks and if no relief consider referral to Sports Med.      Relevant Orders   VITAMIN D 25 Hydroxy (Vit-D Deficiency, Fractures) (Completed)   No orders of the defined types were placed in this encounter.  I, Danise Edge, MD, personally preformed the services described in this documentation.  All medical record entries made by the scribe were at my direction and in my presence.  I have reviewed the chart and discharge instructions (if applicable) and agree that the record reflects my personal performance and is accurate and complete. 12/31/2021  I,Mohammed Iqbal,acting as a scribe for Danise Edge, MD.,have documented all relevant documentation on the behalf of Danise Edge, MD,as directed by  Danise Edge, MD while in the presence of Danise Edge, MD.  Danise Edge, MD

## 2021-12-31 NOTE — Assessment & Plan Note (Deleted)
Lost her husband in June of this year. She is not interested in meds, no counseling.

## 2021-12-31 NOTE — Patient Instructions (Addendum)
Mounjaro and Ozempic shots weekly for sugar and weight  Carbohydrate Counting for Diabetes Mellitus, Adult Carbohydrate counting is a method of keeping track of how many carbohydrates you eat. Eating carbohydrates increases the amount of sugar (glucose) in the blood. Counting how many carbohydrates you eat improves how well you manage your blood glucose. This, in turn, helps you manage your diabetes. Carbohydrates are measured in grams (g) per serving. It is important to know how many carbohydrates (in grams or by serving size) you can have in each meal. This is different for every person. A dietitian can help you make a meal plan and calculate how many carbohydrates you should have at each meal and snack. What foods contain carbohydrates? Carbohydrates are found in the following foods: Grains, such as breads and cereals. Dried beans and soy products. Starchy vegetables, such as potatoes, peas, and corn. Fruit and fruit juices. Milk and yogurt. Sweets and snack foods, such as cake, cookies, candy, chips, and soft drinks. How do I count carbohydrates in foods? There are two ways to count carbohydrates in food. You can read food labels or learn standard serving sizes of foods. You can use either of these methods or a combination of both. Using the Nutrition Facts label The Nutrition Facts list is included on the labels of almost all packaged foods and beverages in the [place]. It includes: The serving size. Information about nutrients in each serving, including the grams of carbohydrate per serving. To use the Nutrition Facts, decide how many servings you will have. Then, multiply the number of servings by the number of carbohydrates per serving. The resulting number is the total grams of carbohydrates that you will be having. Learning the standard serving sizes of foods When you eat carbohydrate foods that are not packaged or do not include Nutrition Facts on the label, you need to measure  the servings in order to count the grams of carbohydrates. Measure the foods that you will eat with a food scale or measuring cup, if needed. Decide how many standard-size servings you will eat. Multiply the number of servings by 15. For foods that contain carbohydrates, one serving equals 15 g of carbohydrates. For example, if you eat 2 cups or 10 oz (300 g) of strawberries, you will have eaten 2 servings and 30 g of carbohydrates (2 servings x 15 g = 30 g). For foods that have more than one food mixed, such as soups and casseroles, you must count the carbohydrates in each food that is included. The following list contains standard serving sizes of common carbohydrate-rich foods. Each of these servings has about 15 g of carbohydrates: 1 slice of bread. 1 six-inch (15 cm) tortilla. ? cup or 2 oz (53 g) cooked rice or pasta.  cup or 3 oz (85 g) cooked or canned, drained and rinsed beans or lentils.  cup or 3 oz (85 g) starchy vegetable, such as peas, corn, or squash.  cup or 4 oz (120 g) hot cereal.  cup or 3 oz (85 g) boiled or mashed potatoes, or  or 3 oz (85 g) of a large baked potato.  cup or 4 fl oz (118 mL) fruit juice. 1 cup or 8 fl oz (237 mL) milk. 1 small or 4 oz (106 g) apple.  or 2 oz (63 g) of a medium banana. 1 cup or 5 oz (150 g) strawberries. 3 cups or 1 oz (28.3 g) popped popcorn. What is an example of carbohydrate counting? To calculate the  grams of carbohydrates in this sample meal, follow the steps shown below. Sample meal 3 oz (85 g) chicken breast. ? cup or 4 oz (106 g) brown rice.  cup or 3 oz (85 g) corn. 1 cup or 8 fl oz (237 mL) milk. 1 cup or 5 oz (150 g) strawberries with sugar-free whipped topping. Carbohydrate calculation Identify the foods that contain carbohydrates: Rice. Corn. Milk. Strawberries. Calculate how many servings you have of each food: 2 servings rice. 1 serving corn. 1 serving milk. 1 serving strawberries. Multiply each number  of servings by 15 g: 2 servings rice x 15 g = 30 g. 1 serving corn x 15 g = 15 g. 1 serving milk x 15 g = 15 g. 1 serving strawberries x 15 g = 15 g. Add together all of the amounts to find the total grams of carbohydrates eaten: 30 g + 15 g + 15 g + 15 g = 75 g of carbohydrates total. What are tips for following this plan? Shopping Develop a meal plan and then make a shopping list. Buy fresh and frozen vegetables, fresh and frozen fruit, dairy, eggs, beans, lentils, and whole grains. Look at food labels. Choose foods that have more fiber and less sugar. Avoid processed foods and foods with added sugars. Meal planning Aim to have the same number of grams of carbohydrates at each meal and for each snack time. Plan to have regular, balanced meals and snacks. Where to find more information American Diabetes Association: diabetes.org Centers for Disease Control and Prevention: StoreMirror.com.[place] Academy of Nutrition and Dietetics: eatright.org Association of Diabetes Care & Education Specialists: diabeteseducator.org Summary Carbohydrate counting is a method of keeping track of how many carbohydrates you eat. Eating carbohydrates increases the amount of sugar (glucose) in your blood. Counting how many carbohydrates you eat improves how well you manage your blood glucose. This helps you manage your diabetes. A dietitian can help you make a meal plan and calculate how many carbohydrates you should have at each meal and snack. This information is not intended to replace advice given to you by your health care provider. Make sure you discuss any questions you have with your health care provider. Document Revised: 09/05/2019 Document Reviewed: 09/05/2019 Elsevier Patient Education  [employer].

## 2021-12-31 NOTE — Assessment & Plan Note (Signed)
Off and on for several months. Occurs with arising and can be 7 of 10 initaally but as she moves around it improves to 2 of 10. Try Meloxicam 7.5 mg daily x 2 weeks and if no relief consider referral to Sports Med.

## 2022-01-01 ENCOUNTER — Other Ambulatory Visit: Payer: Self-pay

## 2022-01-01 DIAGNOSIS — I10 Essential (primary) hypertension: Secondary | ICD-10-CM

## 2022-01-14 ENCOUNTER — Other Ambulatory Visit: Payer: Self-pay | Admitting: Family Medicine

## 2022-01-29 ENCOUNTER — Other Ambulatory Visit (INDEPENDENT_AMBULATORY_CARE_PROVIDER_SITE_OTHER): Payer: Medicare Other

## 2022-01-29 DIAGNOSIS — I10 Essential (primary) hypertension: Secondary | ICD-10-CM

## 2022-01-29 LAB — CBC WITH DIFFERENTIAL/PLATELET
Basophils Absolute: 0.1 10*3/uL (ref 0.0–0.1)
Basophils Relative: 1 % (ref 0.0–3.0)
Eosinophils Absolute: 0.3 10*3/uL (ref 0.0–0.7)
Eosinophils Relative: 3.2 % (ref 0.0–5.0)
HCT: 42.2 % (ref 36.0–46.0)
Hemoglobin: 14.2 g/dL (ref 12.0–15.0)
Lymphocytes Relative: 23.2 % (ref 12.0–46.0)
Lymphs Abs: 2.2 10*3/uL (ref 0.7–4.0)
MCHC: 33.8 g/dL (ref 30.0–36.0)
MCV: 85.6 fl (ref 78.0–100.0)
Monocytes Absolute: 0.7 10*3/uL (ref 0.1–1.0)
Monocytes Relative: 7.5 % (ref 3.0–12.0)
Neutro Abs: 6.3 10*3/uL (ref 1.4–7.7)
Neutrophils Relative %: 65.1 % (ref 43.0–77.0)
Platelets: 251 10*3/uL (ref 150.0–400.0)
RBC: 4.93 Mil/uL (ref 3.87–5.11)
RDW: 12.8 % (ref 11.5–15.5)
WBC: 9.6 10*3/uL (ref 4.0–10.5)

## 2022-02-15 ENCOUNTER — Other Ambulatory Visit: Payer: Self-pay | Admitting: Family Medicine

## 2022-02-15 DIAGNOSIS — I10 Essential (primary) hypertension: Secondary | ICD-10-CM

## 2022-02-22 DIAGNOSIS — K080 Exfoliation of teeth due to systemic causes: Secondary | ICD-10-CM | POA: Diagnosis not present

## 2022-03-05 ENCOUNTER — Other Ambulatory Visit: Payer: Self-pay | Admitting: Family Medicine

## 2022-03-05 DIAGNOSIS — E119 Type 2 diabetes mellitus without complications: Secondary | ICD-10-CM

## 2022-03-08 ENCOUNTER — Other Ambulatory Visit: Payer: Self-pay | Admitting: Family Medicine

## 2022-03-08 ENCOUNTER — Telehealth: Payer: Self-pay | Admitting: Family Medicine

## 2022-03-08 DIAGNOSIS — E119 Type 2 diabetes mellitus without complications: Secondary | ICD-10-CM

## 2022-03-08 NOTE — Telephone Encounter (Signed)
Pt called stating that the Januvia that she takes has gone up in price and she was wondering about looking into other options. Please Advise.

## 2022-03-10 NOTE — Telephone Encounter (Signed)
Called pt was advised  

## 2022-03-16 ENCOUNTER — Ambulatory Visit (INDEPENDENT_AMBULATORY_CARE_PROVIDER_SITE_OTHER): Payer: Medicare Other | Admitting: Pharmacist

## 2022-03-16 DIAGNOSIS — E119 Type 2 diabetes mellitus without complications: Secondary | ICD-10-CM

## 2022-03-16 NOTE — Patient Instructions (Signed)
Mrs. Krabbenhoft It was a pleasure speaking with you today.  Below is a summary of your health goals and summary of our recent visit.   We are mailing you an application for medication assistance program for Januvia. Please complete and either mail back to our office to drop off at the office.     As always if you have any questions or concerns especially regarding medications, please feel free to contact me either at the phone number below or with a MyChart message.   Cherre Robins, PharmD Clinical Pharmacist [place] [phone] (direct line)  [phone] (main office number)   Patient verbalizes understanding of instructions and care plan provided today and agrees to view in [employer]. Active MyChart status and patient understanding of how to access instructions and care plan via MyChart confirmed with patient.

## 2022-03-16 NOTE — Progress Notes (Signed)
03/16/2022 Name: [name] [name] MRN: [id number] DOB: 03-Jan-1956  Chief Complaint  Patient presents with   Medication Management    Medication cost    [name] [name] is a 67 y.o. year old female who presented for a telephone visit.   They were referred to the pharmacist by their PCP for assistance in managing medication access.    Subjective:  Care Team: Primary Care Provider: Mosie Lukes, MD ; Next Scheduled Visit: 04/22/2022  Medication Access/Adherence  Current Pharmacy:  [place], [place] [address] Phone: [phone] Fax: [phone]  [facility] [place], [facility] [address] AT [facility] [address] [place] [address] Phone: [phone] Fax: [phone]  [facility] [place] [place], [place] [address] Phone: [phone] Fax: [phone]  [place] [address] [place] [address] Phone: [phone] Fax: [phone]  [facility]) [place], [place] [address] [place] [address] Phone: [phone] Fax: [phone]   Patient reports affordability concerns with their medications: Yes  Patient reports access/transportation concerns to their pharmacy: No  Patient reports adherence concerns with their medications:  Yes      Diabetes:  Current medications:  glipizide ER 10mg  - take 2 tablets = 20mg  daily and Januvia 100mg  daily (has not taken Januvia in the last 3 days due to cost)  Patient reports that $45 per month is ridiculous and I am not paying that much for medication every month" She also reached coverage gap in 2023 and in December cost of Januvia was $60.77.    Medications tried in the past: Metformin - caused diarrhea  Current glucose readings: does not check blood glucose at home.    Objective:  Lab Results  Component Value Date   HGBA1C 7.6 (H) 12/31/2021    Lab Results  Component Value Date   CREATININE 0.82 12/31/2021   BUN 15 12/31/2021   NA 137 12/31/2021   K 4.0 12/31/2021   CL 100 12/31/2021   CO2 30 12/31/2021    Lab Results  Component Value Date   CHOL 156 12/31/2021   HDL 68.30 12/31/2021   LDLCALC 76 12/31/2021   TRIG 56.0 12/31/2021   CHOLHDL 2 12/31/2021    Medications Reviewed Today     Reviewed by Cherre Robins, RPH-CPP (Pharmacist) on 03/16/22 at 1330  Med List Status: <None>   Medication Order Taking? Sig Documenting Provider Last Dose Status Informant  amLODipine (NORVASC) 5 MG tablet [id number] Yes TAKE 1 TABLET BY MOUTH DAILY Mosie Lukes, MD Taking Active   Ascorbic Acid (VITAMIN C) 1000 MG tablet [id number] Yes Take 1,000 mg by mouth daily. [provider] Taking Active   aspirin 81 MG EC tablet [id number] Yes Take 81 mg by mouth daily. [provider] Taking Active   calcium carbonate (OS-CAL - DOSED IN MG OF ELEMENTAL CALCIUM) 1250 (500 Ca) MG tablet [id number] Yes Take 1 tablet by mouth daily. [provider] Taking Active   glipiZIDE (GLUCOTROL XL) 10 MG 24 hr tablet [id number] Yes TAKE 2 TABLETS BY MOUTH DAILY WITH BREAKFAST Mosie Lukes, MD Taking Active   JANUVIA 100 MG tablet [id number] No Take 1 tablet (100 mg total) by mouth daily.  Patient not taking: Reported on 03/16/2022   Mosie Lukes, MD Not Taking Active   levothyroxine (SYNTHROID) 75 MCG tablet [id number] Yes TAKE 1 TABLET BY MOUTH EVERY DAY BEFORE BREAKFAST. Mosie Lukes, MD Taking Active   lisinopril (ZESTRIL) 2.5 MG tablet [id number] Yes TAKE 1 TABLET BY MOUTH DAILY. GENERIC EQUIVALENT FOR ZESTRIL Mosie Lukes, MD Taking Active   meclizine (ANTIVERT) 25 MG tablet [id number]  Take 1 tablet (25 mg  total) by mouth 3 (three) times daily as needed for dizziness. Mosie Lukes, MD  Active   Multiple Vitamin (MULTIVITAMIN) tablet [id number] Yes Take 1 tablet by mouth daily. [provider] Taking Active   rosuvastatin (CRESTOR) 5 MG tablet [id number] Yes TAKE 1 TABLET BY MOUTH DAILY Mosie Lukes, MD Taking Active   triamcinolone cream (KENALOG) 0.1 % [id number]  Apply 1 application topically 2 (two) times daily. Mosie Lukes, MD  Active               Assessment/Plan:   Diabetes: Not currently at A1c goal - Reviewed long term cardiovascular and renal outcomes of uncontrolled blood sugar - Reviewed goal A1c - Reviewed 2024 formulary. Cost of Januvia on her BCBS will be $45 / 30 days or $90 / 90 days thru mail order. She will hit coverage gap around October and cost of Januvia will increase to about $140 per month. Patient reports she lost her husband in June 2023 and she cannot afford the cost of Januvia.  - Possible options:   1) apply for Januvia patient assistance program for 2024  2) retry metformin or try pioglitazone in place of Januvia  3) purchase Januvia at current $45 / 30 days or $90 / 90 days  Patient would like to try to apply for patient assistance program. Mailed application to patient (she was hesitant to have [employer] involved. She is concerned with how many people will know her income and personal information.  She has agreed to purchase Januvia for January at local pharmacy until we can complete application for patient assistance program.   All patient's other medications will be $0 copay   Follow Up Plan: Will check back with patient in 2 to 3 weeks to make sure she has  completed application.   Cherre Robins, PharmD Clinical Pharmacist [facility]

## 2022-03-23 ENCOUNTER — Encounter: Payer: Self-pay | Admitting: Pharmacist

## 2022-03-26 ENCOUNTER — Other Ambulatory Visit (INDEPENDENT_AMBULATORY_CARE_PROVIDER_SITE_OTHER): Payer: Medicare Other | Admitting: Pharmacist

## 2022-03-26 DIAGNOSIS — E119 Type 2 diabetes mellitus without complications: Secondary | ICD-10-CM

## 2022-03-26 NOTE — Progress Notes (Signed)
03/26/2022 Name: [name] [name] MRN: [id number] DOB: [birth year]  Chief Complaint  Patient presents with   Medication Management    Januvia MAP    SHALETHA TRA is a 67 y.o. year old female who presented for a telephone visit.   They were referred to the pharmacist by their PCP for assistance in managing medication access.    Subjective:  Care Team: Primary Care Provider: Mosie Lukes, MD ; Next Scheduled Visit: 04/22/2022  Medication Access/Adherence  Current Pharmacy:  [place], [place] [address] Phone: [phone] Fax: [phone]  [facility] [address] [id number], [place] AT [facility] [place] [address] Phone: [phone] Fax: [phone]  [facility]) [place], [place] [address] Phone: [phone] Fax: [place] [address], [place] [address] Phone: [phone] Fax: [phone]  [facility] [place], [place] [id number] [place] [id number] Phone: [phone] Fax: [phone]   Patient reports affordability concerns with their medications: Yes  Patient reports access/transportation concerns to their pharmacy: No  Patient reports adherence concerns with their medications:  Yes      Diabetes:  Current medications:  glipizide ER 53m - take 2 tablets = 263mdaily and Januvia 10046maily  Patient reports that $45 monthly copay for Januvia is too much for her to pay. She has agreed to purchase 1 time while we are working on medication assistance program application.   She also reached coverage gap in 2023 and in December cost of Januvia was  $60.77.   Medications tried in the past: Metformin - caused diarrhea  Current glucose readings: does not check blood glucose at home.    Objective:  Lab Results  Component Value Date   HGBA1C 7.6 (H) 12/31/2021    Lab Results  Component Value Date   CREATININE 0.82 12/31/2021   BUN 15 12/31/2021   NA 137 12/31/2021   K 4.0 12/31/2021   CL 100 12/31/2021   CO2 30 12/31/2021    Lab Results  Component Value Date   CHOL 156 12/31/2021   HDL 68.30 12/31/2021   LDLCALC 76 12/31/2021   TRIG 56.0 12/31/2021   CHOLHDL 2 12/31/2021    Medications Reviewed Today     Reviewed by EckCherre RobinsPH-CPP (Pharmacist) on 03/26/22 at 085Beeed List Status: <None>   Medication Order Taking? Sig Documenting Provider Last Dose Status Informant  amLODipine (NORVASC) 5 MG tablet [id number] TAKE 1 TABLET BY MOUTH DAILY BlyMosie LukesD Taking Active   Ascorbic Acid (VITAMIN C) 1000 MG tablet [id number] Take 1,000 mg by mouth daily. [provider] Taking Active   aspirin 81 MG EC tablet [id number] Take 81 mg by mouth daily. [provider] Taking Active   calcium carbonate (OS-CAL - DOSED IN MG OF ELEMENTAL CALCIUM) 1250 (500 Ca) MG tablet [id number] Take 1 tablet by mouth daily. [provider] Taking Active   glipiZIDE (GLUCOTROL XL) 10 MG 24 hr tablet [id number] TAKE 2 TABLETS BY MOUTH DAILY WITH BREAKFAST BlyMosie LukesD Taking Active   JANUVIA 100 MG tablet [id number] Take 1 tablet (100 mg total) by  mouth daily.  Patient not taking: Reported on 03/16/2022   Mosie Lukes, MD Not Taking Active   levothyroxine (SYNTHROID) 75 MCG tablet [id number] No TAKE 1 TABLET BY MOUTH EVERY DAY BEFORE BREAKFAST. Mosie Lukes, MD Taking Active   lisinopril (ZESTRIL) 2.5 MG tablet [id number] No TAKE 1 TABLET BY MOUTH DAILY. GENERIC EQUIVALENT FOR ZESTRIL Mosie Lukes, MD Taking Active   meclizine (ANTIVERT) 25 MG tablet [id number] No Take 1 tablet (25 mg  total) by mouth 3 (three) times daily as needed for dizziness. Mosie Lukes, MD Taking Active   Multiple Vitamin (MULTIVITAMIN) tablet [id number] No Take 1 tablet by mouth daily. [provider] Taking Active   rosuvastatin (CRESTOR) 5 MG tablet [id number] No TAKE 1 TABLET BY MOUTH DAILY Mosie Lukes, MD Taking Active   triamcinolone cream (KENALOG) 0.1 % [id number] No Apply 1 application topically 2 (two) times daily. Mosie Lukes, MD Taking Active               Assessment/Plan:   Diabetes: Not currently at A1c goal Patient has returned medication assistance program application to our office. I competed the provider portion and forward to PCP. The [facility] / Januvia app requires it to be mailed instead of faxed and also has to have original signatures. Will handle application personally instead of sending to Rx Assitance team.  Reviewed 2024 formulary. Cost of Januvia on her BCBS will be $45 / 30 days or $90 / 90 days thru mail order. She will hit coverage gap around October and cost of Januvia will increase to about $140 per month. Patient reports she lost her husband in June 2023 and she cannot afford the cost of Januvia.   All patient's other medications will be $0 copay   Follow Up Plan: Will check back with patient in 2 to 3 weeks to see where application is in review process.   Cherre Robins, PharmD Clinical Pharmacist [facility] [place]   03/31/2022 - patient assistance form reviewed and signed by PCP. Application was copied and will be scanned into patient chart. Application was mailed to [employer].

## 2022-04-12 ENCOUNTER — Telehealth: Payer: Medicare Other

## 2022-04-21 NOTE — Assessment & Plan Note (Signed)
hgba1c acceptable, minimize simple carbs. Increase exercise as tolerated. Continue current meds 

## 2022-04-21 NOTE — Assessment & Plan Note (Signed)
Encourage heart healthy diet such as MIND or DASH diet, increase exercise, avoid trans fats, simple carbohydrates and processed foods, consider a krill or fish or flaxseed oil cap daily.  Tolerating Rosuvastatin

## 2022-04-21 NOTE — Assessment & Plan Note (Signed)
Well controlled, no changes to meds. Encouraged heart healthy diet such as the DASH diet and exercise as tolerated.  °

## 2022-04-21 NOTE — Assessment & Plan Note (Signed)
Encouraged to get adequate exercise, calcium and vitamin d intake 

## 2022-04-21 NOTE — Assessment & Plan Note (Signed)
On Levothyroxine, continue to monitor 

## 2022-04-21 NOTE — Assessment & Plan Note (Signed)
Supplement and monitor 

## 2022-04-22 ENCOUNTER — Ambulatory Visit (INDEPENDENT_AMBULATORY_CARE_PROVIDER_SITE_OTHER): Payer: Medicare Other | Admitting: Family Medicine

## 2022-04-22 VITALS — BP 128/78 | HR 75 | Temp 97.5°F | Resp 16 | Ht 67.0 in | Wt 203.4 lb

## 2022-04-22 DIAGNOSIS — M810 Age-related osteoporosis without current pathological fracture: Secondary | ICD-10-CM

## 2022-04-22 DIAGNOSIS — E785 Hyperlipidemia, unspecified: Secondary | ICD-10-CM

## 2022-04-22 DIAGNOSIS — E038 Other specified hypothyroidism: Secondary | ICD-10-CM

## 2022-04-22 DIAGNOSIS — F4320 Adjustment disorder, unspecified: Secondary | ICD-10-CM

## 2022-04-22 DIAGNOSIS — I10 Essential (primary) hypertension: Secondary | ICD-10-CM

## 2022-04-22 DIAGNOSIS — E559 Vitamin D deficiency, unspecified: Secondary | ICD-10-CM

## 2022-04-22 DIAGNOSIS — E119 Type 2 diabetes mellitus without complications: Secondary | ICD-10-CM | POA: Diagnosis not present

## 2022-04-22 NOTE — Progress Notes (Signed)
Subjective:   By signing my name below, I, [name] [name], attest that this documentation has been prepared under the direction and in the presence of [name] Lukes, MD., 04/22/2022.   Patient ID: [name] [name], female    DOB: 1955/11/27, 67 y.o.   MRN: [id number]  Chief Complaint  Patient presents with   Follow-up    Follow up   HPI Patient is in today for an office visit. She denies CP/palpitations/SOB/HA/congestion/ fever/chills/GI or GU symptoms.  Depression/Grief Patient is grieving the loss of her husband who passed away in early 2021/05/09. She continues partaking in grief counseling, having had 4 sessions thus far. She has been keeping herself busy through physical activity and chooses not to take antidepressant medications at this time.  Type 2 Diabetes Mellitus Patient continues taking Glipizide ER 20 mg daily, but has not taken Januvia 100 mg this past month due to insurance complications. She expresses that she wants to lose weight but is not interested in taking injectable weight-loss medications at this time. Body mass index is 31.86 kg/m.  Lab Results  Component Value Date   HGBA1C 7.6 (H) 12/31/2021   Wt Readings from Last 3 Encounters:  04/22/22 203 lb 6.4 oz (92.3 kg)  12/31/21 200 lb 6.4 oz (90.9 kg)  09/10/21 194 lb (88 kg)   Vertigo Patient reports having several recent episodes of vertigo and is wondering if it would be appropriate to take vitamin B12. She states that these episodes are mild and there is no pattern to them.  Past Medical History:  Diagnosis Date   Back pain 06/13/2015   Diabetes mellitus    started Metformin 12/2012   History of shingles 2010-05-10   back   Hyperlipidemia    Hypertension    Hypothyroidism 07/29/2010   S/p FNA showing non-neoplastic goiter 7/12 w/ Dr Constance Holster    Obesity 08/06/2016   Osteoporosis 08/06/2016   Vitamin D deficiency 08/06/2016    Past Surgical History:  Procedure Laterality Date   ABDOMINAL HYSTERECTOMY  1997    APPENDECTOMY     MASS EXCISION  03/02/2011   Procedure: EXCISION MASS;  Surgeon: Wynonia Sours, MD;  Location: [facility];  Service: Orthopedics;  Laterality: Right;  Excision Cyst Interphangeal Right Thumb   OOPHORECTOMY     SHOULDER ARTHROSCOPY W/ ROTATOR CUFF REPAIR  05/09/05   rt   TONSILLECTOMY     TRIGGER FINGER RELEASE  11/01/2011   Procedure: RELEASE TRIGGER FINGER/A-1 PULLEY;  Surgeon: Wynonia Sours, MD;  Location: [facility];  Service: Orthopedics;  Laterality: Right;  right thumb, possible excision cyst    Family History  Problem Relation Age of Onset   Hypertension Mother    Diabetes Mother    Breast cancer Mother 9   Other Mother    Hypertension Father    Cancer Father        throat   Alcohol abuse Father    Hypertension Brother    Heart disease Brother        heart murmur   Alcohol abuse Brother    Cancer Maternal Grandmother    Heart disease Maternal Grandfather    Heart disease Paternal Grandmother    Cancer Maternal Aunt    Breast cancer Maternal Aunt     Social History   Socioeconomic History   Marital status: Widowed    Spouse name: Not on file   Number of children: Not on file   Years of education: Not  on file   Highest education level: Not on file  Occupational History   Occupation: Marketing executive  Tobacco Use   Smoking status: Never   Smokeless tobacco: Never  Substance and Sexual Activity   Alcohol use: No   Drug use: No   Sexual activity: Yes    Partners: Male    Comment: lives with husband, works at [employer], no major dietary   Other Topics Concern   Not on file  Social History Narrative   Widowed; step daughterRegular exercise: walk 2 days a weekCaffeine use: 2 cups of coffee daily   Social Determinants of Health   Financial Resource Strain: Low Risk  (10/16/2020)   Overall Financial Resource Strain (CARDIA)    Difficulty of Paying Living Expenses: Not hard at all  Food Insecurity: No Food  Insecurity (10/16/2020)   Hunger Vital Sign    Worried About Running Out of Food in the Last Year: Never true    [place] in the Last Year: Never true  Transportation Needs: No Transportation Needs (10/16/2020)   PRAPARE - Hydrologist (Medical): No    Lack of Transportation (Non-Medical): No  Physical Activity: Inactive (10/16/2020)   Exercise Vital Sign    Days of Exercise per Week: 0 days    Minutes of Exercise per Session: 0 min  Stress: No Stress Concern Present (10/16/2020)   [place]    Feeling of Stress : Not at all  Social Connections: Moderately Integrated (10/16/2020)   Social Connection and Isolation Panel [NHANES]    Frequency of Communication with Friends and Family: More than three times a week    Frequency of Social Gatherings with Friends and Family: More than three times a week    Attends Religious Services: More than 4 times per year    Active Member of [employer] or Organizations: No    Attends Archivist Meetings: Never    Marital Status: Married  Human resources officer Violence: Not At Risk (10/16/2020)   Humiliation, Afraid, Rape, and Kick questionnaire    Fear of Current or Ex-Partner: No    Emotionally Abused: No    Physically Abused: No    Sexually Abused: No    Outpatient Medications Prior to Visit  Medication Sig Dispense Refill   amLODipine (NORVASC) 5 MG tablet TAKE 1 TABLET BY MOUTH DAILY 90 tablet 0   Ascorbic Acid (VITAMIN C) 1000 MG tablet Take 1,000 mg by mouth daily.     aspirin 81 MG EC tablet Take 81 mg by mouth daily.     calcium carbonate (OS-CAL - DOSED IN MG OF ELEMENTAL CALCIUM) 1250 (500 Ca) MG tablet Take 1 tablet by mouth daily.     glipiZIDE (GLUCOTROL XL) 10 MG 24 hr tablet TAKE 2 TABLETS BY MOUTH DAILY WITH BREAKFAST 180 tablet 1   JANUVIA 100 MG tablet Take 1 tablet (100 mg total) by mouth daily. 30 tablet 3   levothyroxine (SYNTHROID) 75  MCG tablet TAKE 1 TABLET BY MOUTH EVERY DAY BEFORE BREAKFAST. 90 tablet 1   lisinopril (ZESTRIL) 2.5 MG tablet TAKE 1 TABLET BY MOUTH DAILY. GENERIC EQUIVALENT FOR ZESTRIL 90 tablet 1   meclizine (ANTIVERT) 25 MG tablet Take 1 tablet (25 mg total) by mouth 3 (three) times daily as needed for dizziness. 30 tablet 1   Multiple Vitamin (MULTIVITAMIN) tablet Take 1 tablet by mouth daily.     rosuvastatin (CRESTOR) 5 MG  tablet TAKE 1 TABLET BY MOUTH DAILY 90 tablet 2   triamcinolone cream (KENALOG) 0.1 % Apply 1 application topically 2 (two) times daily. 80 g 2   No facility-administered medications prior to visit.    Allergies  Allergen Reactions   Codeine    Tramadol    Metformin Diarrhea    Review of Systems  Constitutional:  Negative for chills and fever.  HENT:  Negative for congestion.   Respiratory:  Negative for shortness of breath.   Cardiovascular:  Negative for chest pain and palpitations.  Gastrointestinal:  Negative for abdominal pain, blood in stool, constipation, diarrhea, nausea and vomiting.  Genitourinary:  Negative for dysuria, frequency, hematuria and urgency.  Skin:           Neurological:  Negative for headaches.  Psychiatric/Behavioral:  Positive for depression.        Objective:    Physical Exam Constitutional:      General: She is not in acute distress.    Appearance: Normal appearance. She is normal weight. She is not ill-appearing.  HENT:     Head: Normocephalic and atraumatic.     Right Ear: External ear normal.     Left Ear: External ear normal.     Nose: Nose normal.     Mouth/Throat:     Mouth: Mucous membranes are moist.     Pharynx: Oropharynx is clear.  Eyes:     General:        Right eye: No discharge.        Left eye: No discharge.     Extraocular Movements: Extraocular movements intact.     Conjunctiva/sclera: Conjunctivae normal.     Pupils: Pupils are equal, round, and reactive to light.  Cardiovascular:     Rate and Rhythm:  Normal rate and regular rhythm.     Pulses: Normal pulses.     Heart sounds: Normal heart sounds. No murmur heard.    No gallop.  Pulmonary:     Effort: Pulmonary effort is normal. No respiratory distress.     Breath sounds: Normal breath sounds. No wheezing or rales.  Abdominal:     General: Bowel sounds are normal.     Palpations: Abdomen is soft.     Tenderness: There is no abdominal tenderness. There is no guarding.  Musculoskeletal:        General: Normal range of motion.     Cervical back: Normal range of motion.     Right lower leg: No edema.     Left lower leg: No edema.  Skin:    General: Skin is warm and dry.  Neurological:     Mental Status: She is alert and oriented to person, place, and time.  Psychiatric:        Mood and Affect: Mood normal.        Behavior: Behavior normal.        Judgment: Judgment normal.     BP 128/78 (BP Location: Right Arm, Patient Position: Sitting, Cuff Size: Normal)   Pulse 75   Temp (!) 97.5 F (36.4 C) (Oral)   Resp 16   Ht '5\' 7"'$  (1.702 m)   Wt 203 lb 6.4 oz (92.3 kg)   SpO2 98%   BMI 31.86 kg/m  Wt Readings from Last 3 Encounters:  04/22/22 203 lb 6.4 oz (92.3 kg)  12/31/21 200 lb 6.4 oz (90.9 kg)  09/10/21 194 lb (88 kg)    Diabetic Foot Exam - Simple   No data  filed    Lab Results  Component Value Date   WBC 9.6 01/29/2022   HGB 14.2 01/29/2022   HCT 42.2 01/29/2022   PLT 251.0 01/29/2022   GLUCOSE 156 (H) 12/31/2021   CHOL 156 12/31/2021   TRIG 56.0 12/31/2021   HDL 68.30 12/31/2021   LDLCALC 76 12/31/2021   ALT 19 12/31/2021   AST 17 12/31/2021   NA 137 12/31/2021   K 4.0 12/31/2021   CL 100 12/31/2021   CREATININE 0.82 12/31/2021   BUN 15 12/31/2021   CO2 30 12/31/2021   TSH 2.77 12/31/2021   HGBA1C 7.6 (H) 12/31/2021   MICROALBUR <0.7 09/10/2021    Lab Results  Component Value Date   TSH 2.77 12/31/2021   Lab Results  Component Value Date   WBC 9.6 01/29/2022   HGB 14.2 01/29/2022   HCT  42.2 01/29/2022   MCV 85.6 01/29/2022   PLT 251.0 01/29/2022   Lab Results  Component Value Date   NA 137 12/31/2021   K 4.0 12/31/2021   CO2 30 12/31/2021   GLUCOSE 156 (H) 12/31/2021   BUN 15 12/31/2021   CREATININE 0.82 12/31/2021   BILITOT 0.5 12/31/2021   ALKPHOS 69 12/31/2021   AST 17 12/31/2021   ALT 19 12/31/2021   PROT 6.7 12/31/2021   ALBUMIN 4.6 12/31/2021   CALCIUM 9.5 12/31/2021   GFR 73.76 12/31/2021   Lab Results  Component Value Date   CHOL 156 12/31/2021   Lab Results  Component Value Date   HDL 68.30 12/31/2021   Lab Results  Component Value Date   LDLCALC 76 12/31/2021   Lab Results  Component Value Date   TRIG 56.0 12/31/2021   Lab Results  Component Value Date   CHOLHDL 2 12/31/2021   Lab Results  Component Value Date   HGBA1C 7.6 (H) 12/31/2021      Assessment & Plan:  Depression/Grief: Counseled patient and proposed taking antidepressant medications to manage depression/grief.  Healthy Lifestyle: Encouraged 6-8 hours of sleep, heart healthy diet, 60-80 oz of non-alcohol/non-caffeinated fluids, and minimum of 4000 steps daily.  Problem List Items Addressed This Visit     Adjustment reaction    Her husband in June 2023 and then a good friend just lost her husband.       DM type 2 (diabetes mellitus, type 2) ([place]) - Primary    hgba1c acceptable, minimize simple carbs. Increase exercise as tolerated. Continue current meds        Essential hypertension    Well controlled, no changes to meds. Encouraged heart healthy diet such as the DASH diet and exercise as tolerated.        Hyperlipidemia with target LDL less than 100    Encourage heart healthy diet such as MIND or DASH diet, increase exercise, avoid trans fats, simple carbohydrates and processed foods, consider a krill or fish or flaxseed oil cap daily.  Tolerating Rosuvastatin      Hypothyroidism    On Levothyroxine, continue to monitor       Osteoporosis, post-menopausal     Encouraged to get adequate exercise, calcium and vitamin d intake       Vitamin D deficiency    Supplement and monitor       No orders of the defined types were placed in this encounter.  I, Penni Homans, MD, personally preformed the services described in this documentation.  All medical record entries made by the scribe were at my direction and in my presence.  I have reviewed the chart and discharge instructions (if applicable) and agree that the record reflects my personal performance and is accurate and complete. 04/22/2022  I,Mohammed Iqbal,acting as a scribe for Penni Homans, MD.,have documented all relevant documentation on the behalf of Penni Homans, MD,as directed by  Penni Homans, MD while in the presence of Penni Homans, MD.  Penni Homans, MD

## 2022-04-22 NOTE — Assessment & Plan Note (Signed)
Her husband in June 2023 and then a good friend just lost her husband.

## 2022-04-22 NOTE — Patient Instructions (Signed)
Managing Loss, Adult People experience loss in many different ways throughout their lives. Events such as moving, changing jobs, and losing friends can create a sense of loss. The loss may be as serious as a major health change, divorce, death of a pet, or death of a loved one. All of these types of loss are likely to create a physical and emotional reaction known as grief. Grief is the result of a major change or an absence of something or someone that you count on. Grief is a normal reaction to loss. A variety of factors can affect your grieving experience, including: The nature of your loss. Your relationship to what or whom you lost. Your understanding of grief and how to manage it. Your support system. Be aware that when grief becomes extreme, it can lead to more severe issues like isolation, depression, anxiety, or suicidal thoughts. Talk with your health care provider if you have any of these issues. How to manage lifestyle changes Keep to your normal routine as much as possible. If you have trouble focusing or doing normal activities, it is acceptable to take some time away from your normal routine. Spend time with friends and loved ones. Eat a healthy diet, get plenty of sleep, and rest when you feel tired. How to recognize changes  The way that you deal with your grief will affect your ability to function as you normally do. When grieving, you may experience these changes: Numbness, shock, sadness, anxiety, anger, denial, and guilt. Thoughts about death. Unexpected crying. A physical sensation of emptiness in your stomach. Problems sleeping and eating. Tiredness (fatigue). Loss of interest in normal activities. Dreaming about or imagining seeing the person who died. A need to remember what or whom you lost. Difficulty thinking about anything other than your loss for a period of time. Relief. If you have been expecting the loss for a while, you may feel a sense of relief when it  happens. Follow these instructions at home: Activity Express your feelings in healthy ways, such as: Talking with others about your loss. It may be helpful to find others who have had a similar loss, such as a support group. Writing down your feelings in a journal. Doing physical activities to release stress and emotional energy. Doing creative activities like painting, sculpting, or playing or listening to music. Practicing resilience. This is the ability to recover and adjust after facing challenges. Reading some resources that encourage resilience may help you to learn ways to practice those behaviors.  General instructions Be patient with yourself and others. Allow the grieving process to happen, and remember that grieving takes time. It is likely that you may never feel completely done with some grief. You may find a way to move on while still cherishing memories and feelings about your loss. Accepting your loss is a process. It can take months or longer to adjust. Keep all follow-up visits. This is important. Where to find support To get support for managing loss: Ask your health care provider for help and recommendations, such as grief counseling or therapy. Think about joining a support group for people who are managing a loss. Where to find more information You can find more information about managing loss from: American Society of Clinical Oncology: www.cancer.net American Psychological Association: www.apa.org Contact a health care provider if: Your grief is extreme and keeps getting worse. You have ongoing grief that does not improve. Your body shows symptoms of grief, such as illness. You feel depressed, anxious, or   hopeless. Get help right away if: You have thoughts about hurting yourself or others. Get help right away if you feel like you may hurt yourself or others, or have thoughts about taking your own life. Go to your nearest emergency room or: Call 911. Call the  National Suicide Prevention Lifeline at 1-800-273-8255 or 988. This is open 24 hours a day. Text the Crisis Text Line at 741741. Summary Grief is the result of a major change or an absence of someone or something that you count on. Grief is a normal reaction to loss. The depth of grief and the period of recovery depend on the type of loss and your ability to adjust to the change and process your feelings. Processing grief requires patience and a willingness to accept your feelings and talk about your loss with people who are supportive. It is important to find resources that work for you and to realize that people experience grief differently. There is not one grieving process that works for everyone in the same way. Be aware that when grief becomes extreme, it can lead to more severe issues like isolation, depression, anxiety, or suicidal thoughts. Talk with your health care provider if you have any of these issues. This information is not intended to replace advice given to you by your health care provider. Make sure you discuss any questions you have with your health care provider. Document Revised: 09/22/2020 Document Reviewed: 09/22/2020 Elsevier Patient Education  2023 Elsevier Inc.  

## 2022-04-26 ENCOUNTER — Encounter: Payer: Self-pay | Admitting: Pharmacist

## 2022-04-27 ENCOUNTER — Telehealth: Payer: Self-pay | Admitting: Pharmacist

## 2022-04-27 NOTE — Telephone Encounter (Signed)
Patient sent MyChart message to ask about medication assistance program for Januvia. Application was mailed to [employer] medication assistance program 03/26/2022. Called [employer] medication assistance program today to get update. Unfortunately [employer] medication assistance program is experiencing a backlog of applications and [name] application has not been processed yet. [employer] representative was unable to give me a specific time to expect when application would be reviewed.   Patient was updated by phone. She reports she has enough Januvia for a few weeks. Will continue to check and updated patient on medication assistance program application.

## 2022-04-29 DIAGNOSIS — E1136 Type 2 diabetes mellitus with diabetic cataract: Secondary | ICD-10-CM | POA: Diagnosis not present

## 2022-04-29 DIAGNOSIS — H5213 Myopia, bilateral: Secondary | ICD-10-CM | POA: Diagnosis not present

## 2022-04-29 DIAGNOSIS — H25813 Combined forms of age-related cataract, bilateral: Secondary | ICD-10-CM | POA: Diagnosis not present

## 2022-04-29 DIAGNOSIS — Z7984 Long term (current) use of oral hypoglycemic drugs: Secondary | ICD-10-CM | POA: Diagnosis not present

## 2022-04-29 DIAGNOSIS — H524 Presbyopia: Secondary | ICD-10-CM | POA: Diagnosis not present

## 2022-04-29 LAB — HM DIABETES EYE EXAM

## 2022-05-05 ENCOUNTER — Other Ambulatory Visit: Payer: Self-pay | Admitting: Family Medicine

## 2022-05-05 DIAGNOSIS — I10 Essential (primary) hypertension: Secondary | ICD-10-CM

## 2022-05-12 ENCOUNTER — Telehealth: Payer: Self-pay | Admitting: Pharmacist

## 2022-05-12 NOTE — Telephone Encounter (Signed)
Called [employer] - they received application. They have mailed attestation form to patient 05/10/2022.  Patient called and updated. Let her know to expect the the attestation in the next few days. She should review and sign if agreeable, then mail back to [employer].

## 2022-05-31 ENCOUNTER — Telehealth: Payer: Self-pay | Admitting: Pharmacist

## 2022-05-31 NOTE — Telephone Encounter (Signed)
Called [employer] to check on status of medication assistance program application. Per representative they mailed attestation to patient 05/10/2022 but have not received back yet.  Called Mrs. Coultas. She has not received attestation yet.  Called [employer] medication assistance program back and requested another attestation to be mailed out to patient.

## 2022-06-01 ENCOUNTER — Telehealth: Payer: Self-pay | Admitting: Family Medicine

## 2022-06-01 NOTE — Telephone Encounter (Signed)
Pt received letter yesterday afternoon and she is not sure which box to check. Please call to advise.

## 2022-06-01 NOTE — Telephone Encounter (Signed)
Called patient back and reviewed attestation letter. Assisted in selecting correct options. Patient will mail back to [employer] ASAP.

## 2022-06-17 ENCOUNTER — Other Ambulatory Visit: Payer: Self-pay | Admitting: Family Medicine

## 2022-06-18 ENCOUNTER — Telehealth: Payer: Self-pay | Admitting: Pharmacist

## 2022-06-18 NOTE — Telephone Encounter (Signed)
Patient was approved for [employer] medication assistance program for [facility] 06/16/2022 Pharmacy is currently working on prescription and should be shipped to patient in the next 1 to 3 days.  Patient was notified. She should expect delivery fo Januvia to her home between 08/22/2022 and 08/29/2022.  She is to notify me if she does not receive Januvia delivery by then.

## 2022-07-16 ENCOUNTER — Other Ambulatory Visit: Payer: Self-pay | Admitting: Family Medicine

## 2022-07-21 DIAGNOSIS — D1722 Benign lipomatous neoplasm of skin and subcutaneous tissue of left arm: Secondary | ICD-10-CM | POA: Diagnosis not present

## 2022-07-21 DIAGNOSIS — L821 Other seborrheic keratosis: Secondary | ICD-10-CM | POA: Diagnosis not present

## 2022-07-21 DIAGNOSIS — D225 Melanocytic nevi of trunk: Secondary | ICD-10-CM | POA: Diagnosis not present

## 2022-07-21 DIAGNOSIS — L820 Inflamed seborrheic keratosis: Secondary | ICD-10-CM | POA: Diagnosis not present

## 2022-07-21 DIAGNOSIS — L723 Sebaceous cyst: Secondary | ICD-10-CM | POA: Diagnosis not present

## 2022-08-05 ENCOUNTER — Ambulatory Visit: Payer: Medicare Other | Admitting: Family Medicine

## 2022-08-29 NOTE — Assessment & Plan Note (Signed)
hgba1c acceptable, minimize simple carbs. Increase exercise as tolerated. Continue current meds 

## 2022-08-29 NOTE — Assessment & Plan Note (Signed)
Encourage heart healthy diet such as MIND or DASH diet, increase exercise, avoid trans fats, simple carbohydrates and processed foods, consider a krill or fish or flaxseed oil cap daily. Tolerating Rosuvastatin 

## 2022-08-29 NOTE — Progress Notes (Signed)
Subjective:    Patient ID: [name] [name], female    DOB: [birth year], 67 y.o.   MRN: [id number]  Chief Complaint  Patient presents with   Follow-up    Follow up    HPI Discussed the use of AI scribe software for clinical note transcription with the patient, who gave verbal consent to proceed.  History of Present Illness   The patient, with a history of diabetes and weight management issues, presents today with a clicking sound in her hip when walking. She reports no associated pain or dysfunction from this clicking sound. The patient also discusses her struggle with weight loss, mentioning that she is a stress eater and has been less interested in physical activity in the past year. She reports no symptoms of increased urination or thirst that might suggest worsening of her diabetes. The patient also mentions difficulty with sleep, but does not attribute this to any specific cause.    Patient is a 75 female in today for follow up on chronic medical concerns. No recent febrile illness or hospitalizations. Denies CP/palp/SOB/HA/congestion/fevers/GI or GU c/o. Taking meds as prescribed     Past Medical History:  Diagnosis Date   Back pain 06/13/2015   Diabetes mellitus    started Metformin 12/2012   History of shingles 2012   back   Hyperlipidemia    Hypertension    Hypothyroidism 07/29/2010   S/p FNA showing non-neoplastic goiter 7/12 w/ Dr Pollyann Kennedy    Obesity 08/06/2016   Osteoporosis 08/06/2016   Vitamin D deficiency 08/06/2016    Past Surgical History:  Procedure Laterality Date   ABDOMINAL HYSTERECTOMY  1997   APPENDECTOMY     MASS EXCISION  03/02/2011   Procedure: EXCISION MASS;  Surgeon: Nicki Reaper, MD;  Location: [facility];  Service: Orthopedics;  Laterality: Right;  Excision Cyst Interphangeal Right Thumb   OOPHORECTOMY     SHOULDER ARTHROSCOPY W/ ROTATOR CUFF REPAIR  2007   rt   TONSILLECTOMY     TRIGGER FINGER RELEASE  11/01/2011   Procedure: RELEASE  TRIGGER FINGER/A-1 PULLEY;  Surgeon: Nicki Reaper, MD;  Location: [facility];  Service: Orthopedics;  Laterality: Right;  right thumb, possible excision cyst    Family History  Problem Relation Age of Onset   Hypertension Mother    Diabetes Mother    Breast cancer Mother 57   Other Mother    Hypertension Father    Cancer Father        throat   Alcohol abuse Father    Hypertension Brother    Heart disease Brother        heart murmur   Alcohol abuse Brother    Cancer Maternal Grandmother    Heart disease Maternal Grandfather    Heart disease Paternal Grandmother    Cancer Maternal Aunt    Breast cancer Maternal Aunt     Social History   Socioeconomic History   Marital status: Widowed    Spouse name: Not on file   Number of children: Not on file   Years of education: Not on file   Highest education level: 12th grade  Occupational History   Occupation: Physiological scientist  Tobacco Use   Smoking status: Never   Smokeless tobacco: Never  Substance and Sexual Activity   Alcohol use: No   Drug use: No   Sexual activity: Yes    Partners: Male    Comment: lives with husband, works at [employer], no major  dietary   Other Topics Concern   Not on file  Social History Narrative   Widowed; step daughterRegular exercise: walk 2 days a weekCaffeine use: 2 cups of coffee daily   Social Determinants of Health   Financial Resource Strain: Low Risk  (08/23/2022)   Overall Financial Resource Strain (CARDIA)    Difficulty of Paying Living Expenses: Not hard at all  Food Insecurity: No Food Insecurity (08/23/2022)   Hunger Vital Sign    Worried About Running Out of Food in the Last Year: Never true    Ran Out of Food in the Last Year: Never true  Transportation Needs: No Transportation Needs (08/23/2022)   PRAPARE - Administrator, Civil Service (Medical): No    Lack of Transportation (Non-Medical): No  Physical Activity: Insufficiently Active (08/23/2022)    Exercise Vital Sign    Days of Exercise per Week: 4 days    Minutes of Exercise per Session: 20 min  Stress: Stress Concern Present (08/23/2022)   [employer] of Occupational Health - Occupational Stress Questionnaire    Feeling of Stress : To some extent  Social Connections: Moderately Integrated (08/23/2022)   Social Connection and Isolation Panel [NHANES]    Frequency of Communication with Friends and Family: More than three times a week    Frequency of Social Gatherings with Friends and Family: Three times a week    Attends Religious Services: More than 4 times per year    Active Member of Clubs or Organizations: Yes    Attends Banker Meetings: More than 4 times per year    Marital Status: Widowed  Intimate Partner Violence: Not At Risk (10/16/2020)   Humiliation, Afraid, Rape, and Kick questionnaire    Fear of Current or Ex-Partner: No    Emotionally Abused: No    Physically Abused: No    Sexually Abused: No    Outpatient Medications Prior to Visit  Medication Sig Dispense Refill   Ascorbic Acid (VITAMIN C) 1000 MG tablet Take 1,000 mg by mouth daily.     aspirin 81 MG EC tablet Take 81 mg by mouth daily.     calcium carbonate (OS-CAL - DOSED IN MG OF ELEMENTAL CALCIUM) 1250 (500 Ca) MG tablet Take 1 tablet by mouth daily.     glipiZIDE (GLUCOTROL XL) 10 MG 24 hr tablet TAKE 2 TABLETS BY MOUTH DAILY WITH BREAKFAST 180 tablet 1   JANUVIA 100 MG tablet Take 1 tablet (100 mg total) by mouth daily. 30 tablet 3   lisinopril (ZESTRIL) 2.5 MG tablet TAKE 1 TABLET BY MOUTH DAILY. GENERIC EQUIVALENT FOR ZESTRIL 90 tablet 1   meclizine (ANTIVERT) 25 MG tablet Take 1 tablet (25 mg total) by mouth 3 (three) times daily as needed for dizziness. 30 tablet 1   Multiple Vitamin (MULTIVITAMIN) tablet Take 1 tablet by mouth daily.     rosuvastatin (CRESTOR) 5 MG tablet TAKE 1 TABLET BY MOUTH DAILY 90 tablet 2   amLODipine (NORVASC) 5 MG tablet Take 1 tablet (5 mg total) by mouth  daily. 90 tablet 0   levothyroxine (SYNTHROID) 75 MCG tablet TAKE 1 TABLET BY MOUTH EVERY DAY BEFORE BREAKFAST. 90 tablet 1   No facility-administered medications prior to visit.    Allergies  Allergen Reactions   Codeine    Tramadol    Metformin Diarrhea    Review of Systems  Constitutional:  Negative for chills, fever and malaise/fatigue.  HENT:  Negative for congestion and hearing loss.  Eyes:  Negative for discharge.  Respiratory:  Negative for cough, sputum production and shortness of breath.   Cardiovascular:  Negative for chest pain, palpitations and leg swelling.  Gastrointestinal:  Negative for abdominal pain, blood in stool, constipation, diarrhea, heartburn, nausea and vomiting.  Genitourinary:  Negative for dysuria, frequency, hematuria and urgency.  Musculoskeletal:  Negative for back pain, falls and myalgias.  Skin:  Negative for rash.  Neurological:  Negative for dizziness, sensory change, loss of consciousness, weakness and headaches.  Endo/Heme/Allergies:  Negative for environmental allergies. Does not bruise/bleed easily.  Psychiatric/Behavioral:  Negative for depression and suicidal ideas. The patient is not nervous/anxious and does not have insomnia.        Objective:    Physical Exam Constitutional:      General: She is not in acute distress.    Appearance: Normal appearance. She is not diaphoretic.  HENT:     Head: Normocephalic and atraumatic.     Right Ear: Tympanic membrane, ear canal and external ear normal.     Left Ear: Tympanic membrane, ear canal and external ear normal.     Nose: Nose normal.     Mouth/Throat:     Mouth: Mucous membranes are moist.     Pharynx: Oropharynx is clear. No oropharyngeal exudate.  Eyes:     General: No scleral icterus.       Right eye: No discharge.        Left eye: No discharge.     Conjunctiva/sclera: Conjunctivae normal.     Pupils: Pupils are equal, round, and reactive to light.  Neck:     Thyroid: No  thyromegaly.  Cardiovascular:     Rate and Rhythm: Normal rate and regular rhythm.     Heart sounds: Normal heart sounds. No murmur heard. Pulmonary:     Effort: Pulmonary effort is normal. No respiratory distress.     Breath sounds: Normal breath sounds. No wheezing or rales.  Abdominal:     General: Bowel sounds are normal. There is no distension.     Palpations: Abdomen is soft. There is no mass.     Tenderness: There is no abdominal tenderness.  Musculoskeletal:        General: No tenderness. Normal range of motion.     Cervical back: Normal range of motion and neck supple.  Lymphadenopathy:     Cervical: No cervical adenopathy.  Skin:    General: Skin is warm and dry.     Findings: No rash.  Neurological:     General: No focal deficit present.     Mental Status: She is alert and oriented to person, place, and time.     Cranial Nerves: No cranial nerve deficit.     Coordination: Coordination normal.     Deep Tendon Reflexes: Reflexes are normal and symmetric. Reflexes normal.  Psychiatric:        Mood and Affect: Mood normal.        Behavior: Behavior normal.        Thought Content: Thought content normal.        Judgment: Judgment normal.     BP 128/78 (BP Location: Left Arm, Patient Position: Sitting, Cuff Size: Normal)   Pulse 73   Temp 97.7 F (36.5 C) (Oral)   Resp 16   Ht 5\' 7"  (1.702 m)   Wt 206 lb (93.4 kg)   SpO2 96%   BMI 32.26 kg/m  Wt Readings from Last 3 Encounters:  08/30/22 206 lb (93.4 kg)  04/22/22 203 lb 6.4 oz (92.3 kg)  12/31/21 200 lb 6.4 oz (90.9 kg)    Diabetic Foot Exam - Simple   No data filed    Lab Results  Component Value Date   WBC 8.1 08/30/2022   HGB 14.4 08/30/2022   HCT 43.5 08/30/2022   PLT 220.0 08/30/2022   GLUCOSE 136 (H) 08/30/2022   CHOL 163 08/30/2022   TRIG 102.0 08/30/2022   HDL 63.10 08/30/2022   LDLCALC 80 08/30/2022   ALT 15 08/30/2022   AST 15 08/30/2022   NA 139 08/30/2022   K 4.7 08/30/2022   CL  103 08/30/2022   CREATININE 0.72 08/30/2022   BUN 15 08/30/2022   CO2 28 08/30/2022   TSH 4.24 08/30/2022   HGBA1C 7.5 (H) 08/30/2022   MICROALBUR <0.7 09/10/2021    Lab Results  Component Value Date   TSH 4.24 08/30/2022   Lab Results  Component Value Date   WBC 8.1 08/30/2022   HGB 14.4 08/30/2022   HCT 43.5 08/30/2022   MCV 87.3 08/30/2022   PLT 220.0 08/30/2022   Lab Results  Component Value Date   NA 139 08/30/2022   K 4.7 08/30/2022   CO2 28 08/30/2022   GLUCOSE 136 (H) 08/30/2022   BUN 15 08/30/2022   CREATININE 0.72 08/30/2022   BILITOT 0.7 08/30/2022   ALKPHOS 61 08/30/2022   AST 15 08/30/2022   ALT 15 08/30/2022   PROT 6.9 08/30/2022   ALBUMIN 4.6 08/30/2022   CALCIUM 9.8 08/30/2022   GFR 85.82 08/30/2022   Lab Results  Component Value Date   CHOL 163 08/30/2022   Lab Results  Component Value Date   HDL 63.10 08/30/2022   Lab Results  Component Value Date   LDLCALC 80 08/30/2022   Lab Results  Component Value Date   TRIG 102.0 08/30/2022   Lab Results  Component Value Date   CHOLHDL 3 08/30/2022   Lab Results  Component Value Date   HGBA1C 7.5 (H) 08/30/2022       Assessment & Plan:  Essential hypertension Assessment & Plan: Well controlled, no changes to meds. Encouraged heart healthy diet such as the DASH diet and exercise as tolerated.    Orders: -     Comprehensive metabolic panel -     CBC with Differential/Platelet -     TSH  Type 2 diabetes mellitus without complication, without long-term current use of insulin (HCC) Assessment & Plan: hgba1c acceptable, minimize simple carbs. Increase exercise as tolerated. Continue current meds    Orders: -     Hemoglobin A1c  Other specified hypothyroidism Assessment & Plan: On Levothyroxine, continue to monitor    Osteoporosis, post-menopausal Assessment & Plan: Encouraged to get adequate exercise, calcium and vitamin d intake    Vitamin D deficiency Assessment &  Plan: Supplement and monitor   Orders: -     VITAMIN D 25 Hydroxy (Vit-D Deficiency, Fractures)  Hyperlipidemia with target LDL less than 100 Assessment & Plan: Encourage heart healthy diet such as MIND or DASH diet, increase exercise, avoid trans fats, simple carbohydrates and processed foods, consider a krill or fish or flaxseed oil cap daily.  Tolerating Rosuvastatin  Orders: -     Lipid panel    Assessment and Plan    Type 2 Diabetes Mellitus: Patient is on maximum doses of Glipizide and Januvia. Discussed the possibility of adding a GLP-1 agonist like Ozempic for better glycemic control and potential weight loss. Patient is hesitant about  adding more medications. Pending lab results to determine next steps. -Check HbA1c and fasting glucose today. -Discuss results and potential medication changes based on lab results. Patient has considered and chosen not to start GLP1 at this time  Hip Clicking: Patient reports a clicking sound in the hip when walking. No associated pain or dysfunction. Likely due to wear and tear, possibly early osteoarthritis. -Encourage weight loss, stretching, and continued physical activity to slow down wear and tear. -Consider referral to orthopedics or sports medicine if symptoms worsen or become symptomatic.  General Health Maintenance / Followup Plans: -Encourage healthy diet, hydration, and regular physical activity. -Plan for follow-up visit in 4 months for annual physical. -Contact patient if any surprises in blood work.         Danise Edge, MD

## 2022-08-29 NOTE — Assessment & Plan Note (Signed)
Supplement and monitor 

## 2022-08-29 NOTE — Assessment & Plan Note (Signed)
On Levothyroxine, continue to monitor 

## 2022-08-29 NOTE — Assessment & Plan Note (Signed)
Well controlled, no changes to meds. Encouraged heart healthy diet such as the DASH diet and exercise as tolerated.  °

## 2022-08-29 NOTE — Assessment & Plan Note (Signed)
Encouraged to get adequate exercise, calcium and vitamin d intake 

## 2022-08-30 ENCOUNTER — Other Ambulatory Visit: Payer: Self-pay

## 2022-08-30 ENCOUNTER — Ambulatory Visit (INDEPENDENT_AMBULATORY_CARE_PROVIDER_SITE_OTHER): Payer: Medicare Other | Admitting: Family Medicine

## 2022-08-30 VITALS — BP 128/78 | HR 73 | Temp 97.7°F | Resp 16 | Ht 67.0 in | Wt 206.0 lb

## 2022-08-30 DIAGNOSIS — E119 Type 2 diabetes mellitus without complications: Secondary | ICD-10-CM

## 2022-08-30 DIAGNOSIS — E785 Hyperlipidemia, unspecified: Secondary | ICD-10-CM | POA: Diagnosis not present

## 2022-08-30 DIAGNOSIS — E038 Other specified hypothyroidism: Secondary | ICD-10-CM | POA: Diagnosis not present

## 2022-08-30 DIAGNOSIS — M810 Age-related osteoporosis without current pathological fracture: Secondary | ICD-10-CM | POA: Diagnosis not present

## 2022-08-30 DIAGNOSIS — I10 Essential (primary) hypertension: Secondary | ICD-10-CM

## 2022-08-30 DIAGNOSIS — E559 Vitamin D deficiency, unspecified: Secondary | ICD-10-CM | POA: Diagnosis not present

## 2022-08-30 DIAGNOSIS — Z7984 Long term (current) use of oral hypoglycemic drugs: Secondary | ICD-10-CM

## 2022-08-30 LAB — COMPREHENSIVE METABOLIC PANEL
ALT: 15 U/L (ref 0–35)
AST: 15 U/L (ref 0–37)
Albumin: 4.6 g/dL (ref 3.5–5.2)
Alkaline Phosphatase: 61 U/L (ref 39–117)
BUN: 15 mg/dL (ref 6–23)
CO2: 28 mEq/L (ref 19–32)
Calcium: 9.8 mg/dL (ref 8.4–10.5)
Chloride: 103 mEq/L (ref 96–112)
Creatinine, Ser: 0.72 mg/dL (ref 0.40–1.20)
GFR: 85.82 mL/min (ref >60.00)
Glucose, Bld: 136 mg/dL — ABNORMAL HIGH (ref 70–99)
Potassium: 4.7 mEq/L (ref 3.5–5.1)
Sodium: 139 mEq/L (ref 135–145)
Total Bilirubin: 0.7 mg/dL (ref 0.2–1.2)
Total Protein: 6.9 g/dL (ref 6.0–8.3)

## 2022-08-30 LAB — LIPID PANEL
Cholesterol: 163 mg/dL (ref 0–200)
HDL: 63.1 mg/dL (ref >39.00)
LDL Cholesterol: 80 mg/dL (ref 0–99)
NonHDL: 100.11
Total CHOL/HDL Ratio: 3
Triglycerides: 102 mg/dL (ref 0.0–149.0)
VLDL: 20.4 mg/dL (ref 0.0–40.0)

## 2022-08-30 LAB — CBC WITH DIFFERENTIAL/PLATELET
Basophils Absolute: 0.1 10*3/uL (ref 0.0–0.1)
Basophils Relative: 0.8 % (ref 0.0–3.0)
Eosinophils Absolute: 0.2 10*3/uL (ref 0.0–0.7)
Eosinophils Relative: 2.7 % (ref 0.0–5.0)
HCT: 43.5 % (ref 36.0–46.0)
Hemoglobin: 14.4 g/dL (ref 12.0–15.0)
Lymphocytes Relative: 24.4 % (ref 12.0–46.0)
Lymphs Abs: 2 10*3/uL (ref 0.7–4.0)
MCHC: 33.1 g/dL (ref 30.0–36.0)
MCV: 87.3 fl (ref 78.0–100.0)
Monocytes Absolute: 0.6 10*3/uL (ref 0.1–1.0)
Monocytes Relative: 7.7 % (ref 3.0–12.0)
Neutro Abs: 5.2 10*3/uL (ref 1.4–7.7)
Neutrophils Relative %: 64.4 % (ref 43.0–77.0)
Platelets: 220 10*3/uL (ref 150.0–400.0)
RBC: 4.99 Mil/uL (ref 3.87–5.11)
RDW: 13.2 % (ref 11.5–15.5)
WBC: 8.1 10*3/uL (ref 4.0–10.5)

## 2022-08-30 LAB — TSH: TSH: 4.24 u[IU]/mL (ref 0.35–5.50)

## 2022-08-30 LAB — VITAMIN D 25 HYDROXY (VIT D DEFICIENCY, FRACTURES): VITD: 30.11 ng/mL (ref 30.00–100.00)

## 2022-08-30 LAB — HEMOGLOBIN A1C: Hgb A1c MFr Bld: 7.5 % — ABNORMAL HIGH (ref 4.6–6.5)

## 2022-08-30 MED ORDER — AMLODIPINE BESYLATE 5 MG PO TABS: 5.0000 mg | ORAL_TABLET | Freq: Every day | ORAL | 0 refills | Status: DC

## 2022-08-30 MED ORDER — LEVOTHYROXINE SODIUM 75 MCG PO TABS: ORAL_TABLET | ORAL | 1 refills | Status: DC

## 2022-08-30 NOTE — Patient Instructions (Signed)
Managing Loss, Adult People experience loss in many different ways throughout their lives. Events such as moving, changing jobs, and losing friends can create a sense of loss. The loss may be as serious as a major health change, divorce, death of a pet, or death of a loved one. All of these types of loss are likely to create a physical and emotional reaction known as grief. Grief is the result of a major change or an absence of something or someone that you count on. Grief is a normal reaction to loss. A variety of factors can affect your grieving experience, including: The nature of your loss. Your relationship to what or whom you lost. Your understanding of grief and how to manage it. Your support system. Be aware that when grief becomes extreme, it can lead to more severe issues like isolation, depression, anxiety, or suicidal thoughts. Talk with your health care provider if you have any of these issues. How to manage lifestyle changes Keep to your normal routine as much as possible. If you have trouble focusing or doing normal activities, it is acceptable to take some time away from your normal routine. Spend time with friends and loved ones. Eat a healthy diet, get plenty of sleep, and rest when you feel tired. How to recognize changes  The way that you deal with your grief will affect your ability to function as you normally do. When grieving, you may experience these changes: Numbness, shock, sadness, anxiety, anger, denial, and guilt. Thoughts about death. Unexpected crying. A physical sensation of emptiness in your stomach. Problems sleeping and eating. Tiredness (fatigue). Loss of interest in normal activities. Dreaming about or imagining seeing the person who died. A need to remember what or whom you lost. Difficulty thinking about anything other than your loss for a period of time. Relief. If you have been expecting the loss for a while, you may feel a sense of relief when it  happens. Follow these instructions at home: Activity Express your feelings in healthy ways, such as: Talking with others about your loss. It may be helpful to find others who have had a similar loss, such as a support group. Writing down your feelings in a journal. Doing physical activities to release stress and emotional energy. Doing creative activities like painting, sculpting, or playing or listening to music. Practicing resilience. This is the ability to recover and adjust after facing challenges. Reading some resources that encourage resilience may help you to learn ways to practice those behaviors.  General instructions Be patient with yourself and others. Allow the grieving process to happen, and remember that grieving takes time. It is likely that you may never feel completely done with some grief. You may find a way to move on while still cherishing memories and feelings about your loss. Accepting your loss is a process. It can take months or longer to adjust. Keep all follow-up visits. This is important. Where to find support To get support for managing loss: Ask your health care provider for help and recommendations, such as grief counseling or therapy. Think about joining a support group for people who are managing a loss. Where to find more information You can find more information about managing loss from: American Society of Clinical Oncology: www.cancer.net American Psychological Association: www.apa.org Contact a health care provider if: Your grief is extreme and keeps getting worse. You have ongoing grief that does not improve. Your body shows symptoms of grief, such as illness. You feel depressed, anxious, or   hopeless. Get help right away if: You have thoughts about hurting yourself or others. Get help right away if you feel like you may hurt yourself or others, or have thoughts about taking your own life. Go to your nearest emergency room or: Call 911. Call the  National Suicide Prevention Lifeline at 1-800-273-8255 or 988. This is open 24 hours a day. Text the Crisis Text Line at 741741. Summary Grief is the result of a major change or an absence of someone or something that you count on. Grief is a normal reaction to loss. The depth of grief and the period of recovery depend on the type of loss and your ability to adjust to the change and process your feelings. Processing grief requires patience and a willingness to accept your feelings and talk about your loss with people who are supportive. It is important to find resources that work for you and to realize that people experience grief differently. There is not one grieving process that works for everyone in the same way. Be aware that when grief becomes extreme, it can lead to more severe issues like isolation, depression, anxiety, or suicidal thoughts. Talk with your health care provider if you have any of these issues. This information is not intended to replace advice given to you by your health care provider. Make sure you discuss any questions you have with your health care provider. Document Revised: 09/22/2020 Document Reviewed: 09/22/2020 Elsevier Patient Education  2024 [employer] Inc.  

## 2022-09-01 ENCOUNTER — Encounter: Payer: Self-pay | Admitting: Family Medicine

## 2022-09-02 ENCOUNTER — Other Ambulatory Visit: Payer: Self-pay | Admitting: Family Medicine

## 2022-09-02 DIAGNOSIS — K080 Exfoliation of teeth due to systemic causes: Secondary | ICD-10-CM | POA: Diagnosis not present

## 2022-09-09 ENCOUNTER — Other Ambulatory Visit: Payer: Self-pay | Admitting: Family Medicine

## 2022-09-09 DIAGNOSIS — I10 Essential (primary) hypertension: Secondary | ICD-10-CM

## 2022-09-30 ENCOUNTER — Encounter (INDEPENDENT_AMBULATORY_CARE_PROVIDER_SITE_OTHER): Payer: Self-pay

## 2022-10-22 ENCOUNTER — Other Ambulatory Visit: Payer: Self-pay | Admitting: Family Medicine

## 2022-10-22 ENCOUNTER — Ambulatory Visit (INDEPENDENT_AMBULATORY_CARE_PROVIDER_SITE_OTHER): Payer: Medicare Other | Admitting: *Deleted

## 2022-10-22 DIAGNOSIS — Z1231 Encounter for screening mammogram for malignant neoplasm of breast: Secondary | ICD-10-CM

## 2022-10-22 DIAGNOSIS — Z0000 Encounter for general adult medical examination without abnormal findings: Secondary | ICD-10-CM | POA: Diagnosis not present

## 2022-10-22 NOTE — Patient Instructions (Signed)
[name] [name] , Thank you for taking time to come for your Medicare Wellness Visit. I appreciate your ongoing commitment to your health goals. Please review the following plan we discussed and let me know if I can assist you in the future.     This is a list of the screening recommended for you and due dates:  Health Maintenance  Topic Date Due   Complete foot exam   09/10/2015   Yearly kidney health urinalysis for diabetes  09/11/2022   Flu Shot  05/16/2023*   Mammogram  12/04/2022   Hemoglobin A1C  03/02/2023   Eye exam for diabetics  04/29/2023   Yearly kidney function blood test for diabetes  08/30/2023   Medicare Annual Wellness Visit  10/22/2023   Colon Cancer Screening  06/24/2024   DTaP/Tdap/Td vaccine (3 - Td or Tdap) 02/22/2027   Pneumonia Vaccine  Completed   DEXA scan (bone density measurement)  Completed   Hepatitis C Screening  Completed   Zoster (Shingles) Vaccine  Completed   HPV Vaccine  Aged Out   COVID-19 Vaccine  Discontinued  *Topic was postponed. The date shown is not the original due date.    Next appointment: Follow up in one year for your annual wellness visit.   Preventive Care 28 Years and Older, Female Preventive care refers to lifestyle choices and visits with your health care provider that can promote health and wellness. What does preventive care include? A yearly physical exam. This is also called an annual well check. Dental exams once or twice a year. Routine eye exams. Ask your health care provider how often you should have your eyes checked. Personal lifestyle choices, including: Daily care of your teeth and gums. Regular physical activity. Eating a healthy diet. Avoiding tobacco and drug use. Limiting alcohol use. Practicing safe sex. Taking low-dose aspirin every day. Taking vitamin and mineral supplements as recommended by your health care provider. What happens during an annual well check? The services and screenings done by your  health care provider during your annual well check will depend on your age, overall health, lifestyle risk factors, and family history of disease. Counseling  Your health care provider may ask you questions about your: Alcohol use. Tobacco use. Drug use. Emotional well-being. Home and relationship well-being. Sexual activity. Eating habits. History of falls. Memory and ability to understand (cognition). Work and work Astronomer. Reproductive health. Screening  You may have the following tests or measurements: Height, weight, and BMI. Blood pressure. Lipid and cholesterol levels. These may be checked every 5 years, or more frequently if you are over 9 years old. Skin check. Lung cancer screening. You may have this screening every year starting at age 10 if you have a 30-pack-year history of smoking and currently smoke or have quit within the past 15 years. Fecal occult blood test (FOBT) of the stool. You may have this test every year starting at age 64. Flexible sigmoidoscopy or colonoscopy. You may have a sigmoidoscopy every 5 years or a colonoscopy every 10 years starting at age 29. Hepatitis C blood test. Hepatitis B blood test. Sexually transmitted disease (STD) testing. Diabetes screening. This is done by checking your blood sugar (glucose) after you have not eaten for a while (fasting). You may have this done every 1-3 years. Bone density scan. This is done to screen for osteoporosis. You may have this done starting at age 66. Mammogram. This may be done every 1-2 years. Talk to your health care provider about how  often you should have regular mammograms. Talk with your health care provider about your test results, treatment options, and if necessary, the need for more tests. Vaccines  Your health care provider may recommend certain vaccines, such as: Influenza vaccine. This is recommended every year. Tetanus, diphtheria, and acellular pertussis (Tdap, Td) vaccine. You may  need a Td booster every 10 years. Zoster vaccine. You may need this after age 70. Pneumococcal 13-valent conjugate (PCV13) vaccine. One dose is recommended after age 34. Pneumococcal polysaccharide (PPSV23) vaccine. One dose is recommended after age 37. Talk to your health care provider about which screenings and vaccines you need and how often you need them. This information is not intended to replace advice given to you by your health care provider. Make sure you discuss any questions you have with your health care provider. Document Released: 02/28/2015 Document Revised: 10/22/2015 Document Reviewed: 12/03/2014 Elsevier Interactive Patient Education  2017 [employer].  Fall Prevention in the Home Falls can cause injuries. They can happen to people of all ages. There are many things you can do to make your home safe and to help prevent falls. What can I do on the outside of my home? Regularly fix the edges of walkways and driveways and fix any cracks. Remove anything that might make you trip as you walk through a door, such as a raised step or threshold. Trim any bushes or trees on the path to your home. Use bright outdoor lighting. Clear any walking paths of anything that might make someone trip, such as rocks or tools. Regularly check to see if handrails are loose or broken. Make sure that both sides of any steps have handrails. Any raised decks and porches should have guardrails on the edges. Have any leaves, snow, or ice cleared regularly. Use sand or salt on walking paths during winter. Clean up any spills in your garage right away. This includes oil or grease spills. What can I do in the bathroom? Use night lights. Install grab bars by the toilet and in the tub and shower. Do not use towel bars as grab bars. Use non-skid mats or decals in the tub or shower. If you need to sit down in the shower, use a plastic, non-slip stool. Keep the floor dry. Clean up any water that spills on  the floor as soon as it happens. Remove soap buildup in the tub or shower regularly. Attach bath mats securely with double-sided non-slip rug tape. Do not have throw rugs and other things on the floor that can make you trip. What can I do in the bedroom? Use night lights. Make sure that you have a light by your bed that is easy to reach. Do not use any sheets or blankets that are too big for your bed. They should not hang down onto the floor. Have a firm chair that has side arms. You can use this for support while you get dressed. Do not have throw rugs and other things on the floor that can make you trip. What can I do in the kitchen? Clean up any spills right away. Avoid walking on wet floors. Keep items that you use a lot in easy-to-reach places. If you need to reach something above you, use a strong step stool that has a grab bar. Keep electrical cords out of the way. Do not use floor polish or wax that makes floors slippery. If you must use wax, use non-skid floor wax. Do not have throw rugs and other things  on the floor that can make you trip. What can I do with my stairs? Do not leave any items on the stairs. Make sure that there are handrails on both sides of the stairs and use them. Fix handrails that are broken or loose. Make sure that handrails are as long as the stairways. Check any carpeting to make sure that it is firmly attached to the stairs. Fix any carpet that is loose or worn. Avoid having throw rugs at the top or bottom of the stairs. If you do have throw rugs, attach them to the floor with carpet tape. Make sure that you have a light switch at the top of the stairs and the bottom of the stairs. If you do not have them, ask someone to add them for you. What else can I do to help prevent falls? Wear shoes that: Do not have high heels. Have rubber bottoms. Are comfortable and fit you well. Are closed at the toe. Do not wear sandals. If you use a stepladder: Make sure  that it is fully opened. Do not climb a closed stepladder. Make sure that both sides of the stepladder are locked into place. Ask someone to hold it for you, if possible. Clearly mark and make sure that you can see: Any grab bars or handrails. First and last steps. Where the edge of each step is. Use tools that help you move around (mobility aids) if they are needed. These include: Canes. Walkers. Scooters. Crutches. Turn on the lights when you go into a dark area. Replace any light bulbs as soon as they burn out. Set up your furniture so you have a clear path. Avoid moving your furniture around. If any of your floors are uneven, fix them. If there are any pets around you, be aware of where they are. Review your medicines with your doctor. Some medicines can make you feel dizzy. This can increase your chance of falling. Ask your doctor what other things that you can do to help prevent falls. This information is not intended to replace advice given to you by your health care provider. Make sure you discuss any questions you have with your health care provider. Document Released: 11/28/2008 Document Revised: 07/10/2015 Document Reviewed: 03/08/2014 Elsevier Interactive Patient Education  2017 [employer].

## 2022-10-22 NOTE — Progress Notes (Signed)
Subjective:   [name] [name] is a 67 y.o. female who presents for Medicare Annual (Subsequent) preventive examination.  Visit Complete: Virtual  I connected with  [name] [name] on 10/22/22 by a audio enabled telemedicine application and verified that I am speaking with the correct person using two identifiers.  Patient Location: Home  Provider Location: Office/Clinic  I discussed the limitations of evaluation and management by telemedicine. The patient expressed understanding and agreed to proceed.  Patient Medicare AWV questionnaire was completed by the patient on 10/15/22; I have confirmed that all information answered by patient is correct and no changes since this date.  Review of Systems     Cardiac Risk Factors include: advanced age (>56men, >85 women);diabetes mellitus;dyslipidemia;hypertension;obesity (BMI >30kg/m2)     Objective:    Vital Signs: Unable to obtain new vitals due to this being a telehealth visit.      10/22/2022    8:24 AM 10/20/2021    8:26 AM 10/16/2020    8:24 AM 10/29/2011    1:05 PM 03/01/2011   10:58 AM  Advanced Directives  Does Patient Have a Medical Advance Directive? Yes Yes Yes Patient has advance directive, copy not in chart Patient has advance directive, copy not in chart  Type of Advance Directive Healthcare Power of [place];Living will Healthcare Power of [place];Living will Healthcare Power of [place];Living will    Does patient want to make changes to medical advance directive? No - Patient declined No - Patient declined     Copy of Healthcare Power of Attorney in Chart? No - copy requested No - copy requested No - copy requested      Current Medications (verified) Outpatient Encounter Medications as of 10/22/2022  Medication Sig   amLODipine (NORVASC) 5 MG tablet TAKE 1 TABLET BY MOUTH DAILY   Ascorbic Acid (VITAMIN C) 1000 MG tablet Take 1,000 mg by mouth daily.   aspirin 81 MG EC tablet Take 81 mg by mouth daily.   calcium  carbonate (OS-CAL - DOSED IN MG OF ELEMENTAL CALCIUM) 1250 (500 Ca) MG tablet Take 1 tablet by mouth daily.   glipiZIDE (GLUCOTROL XL) 10 MG 24 hr tablet TAKE 2 TABLETS BY MOUTH DAILY WITH BREAKFAST   JANUVIA 100 MG tablet Take 1 tablet (100 mg total) by mouth daily.   levothyroxine (SYNTHROID) 75 MCG tablet TAKE 1 TABLET BY MOUTH EVERY DAY BEFORE BREAKFAST.   lisinopril (ZESTRIL) 2.5 MG tablet TAKE 1 TABLET BY MOUTH DAILY. GENERIC EQUIVALENT FOR ZESTRIL   meclizine (ANTIVERT) 25 MG tablet Take 1 tablet (25 mg total) by mouth 3 (three) times daily as needed for dizziness.   Multiple Vitamin (MULTIVITAMIN) tablet Take 1 tablet by mouth daily.   rosuvastatin (CRESTOR) 5 MG tablet Take 1 tablet (5 mg total) by mouth daily.   No facility-administered encounter medications on file as of 10/22/2022.    Allergies (verified) Codeine, Tramadol, and Metformin   History: Past Medical History:  Diagnosis Date   Allergy 2000   Back pain 06/13/2015   Diabetes mellitus    started Metformin 12/2012   History of shingles 2012   back   Hyperlipidemia    Hypertension    Hypothyroidism 07/29/2010   S/p FNA showing non-neoplastic goiter 7/12 w/ Dr Pollyann Kennedy    Obesity 08/06/2016   Osteoporosis 08/06/2016   Vitamin D deficiency 08/06/2016   Past Surgical History:  Procedure Laterality Date   ABDOMINAL HYSTERECTOMY  1997   APPENDECTOMY     MASS EXCISION  03/02/2011  Procedure: EXCISION MASS;  Surgeon: Nicki Reaper, MD;  Location: [facility];  Service: Orthopedics;  Laterality: Right;  Excision Cyst Interphangeal Right Thumb   OOPHORECTOMY     SHOULDER ARTHROSCOPY W/ ROTATOR CUFF REPAIR  2007   rt   TONSILLECTOMY     TRIGGER FINGER RELEASE  11/01/2011   Procedure: RELEASE TRIGGER FINGER/A-1 PULLEY;  Surgeon: Nicki Reaper, MD;  Location: [facility];  Service: Orthopedics;  Laterality: Right;  right thumb, possible excision cyst   Family History  Problem Relation Age of  Onset   Hypertension Mother    Diabetes Mother    Breast cancer Mother 73   Other Mother    Hypertension Father    Cancer Father        throat   Alcohol abuse Father    Hypertension Brother    Heart disease Brother        heart murmur   Alcohol abuse Brother    Cancer Maternal Grandmother    Heart disease Maternal Grandfather    Heart disease Paternal Grandmother    Cancer Maternal Aunt    Breast cancer Maternal Aunt    Social History   Socioeconomic History   Marital status: Widowed    Spouse name: Not on file   Number of children: Not on file   Years of education: Not on file   Highest education level: 12th grade  Occupational History   Occupation: Physiological scientist  Tobacco Use   Smoking status: Never   Smokeless tobacco: Never  Substance and Sexual Activity   Alcohol use: No   Drug use: No   Sexual activity: Yes    Partners: Male    Comment: lives with husband, works at [employer], no major dietary   Other Topics Concern   Not on file  Social History Narrative   Widowed; step daughterRegular exercise: walk 2 days a weekCaffeine use: 2 cups of coffee daily   Social Determinants of Health   Financial Resource Strain: Low Risk  (10/15/2022)   Overall Financial Resource Strain (CARDIA)    Difficulty of Paying Living Expenses: Not hard at all  Food Insecurity: No Food Insecurity (10/15/2022)   Hunger Vital Sign    Worried About Running Out of Food in the Last Year: Never true    Ran Out of Food in the Last Year: Never true  Transportation Needs: No Transportation Needs (10/15/2022)   PRAPARE - Administrator, Civil Service (Medical): No    Lack of Transportation (Non-Medical): No  Physical Activity: Insufficiently Active (10/15/2022)   Exercise Vital Sign    Days of Exercise per Week: 4 days    Minutes of Exercise per Session: 20 min  Stress: Stress Concern Present (10/15/2022)   [employer] of Occupational Health - Occupational Stress  Questionnaire    Feeling of Stress : To some extent  Social Connections: Moderately Integrated (10/15/2022)   Social Connection and Isolation Panel [NHANES]    Frequency of Communication with Friends and Family: More than three times a week    Frequency of Social Gatherings with Friends and Family: Three times a week    Attends Religious Services: More than 4 times per year    Active Member of Clubs or Organizations: Yes    Attends Banker Meetings: More than 4 times per year    Marital Status: Widowed    Tobacco Counseling Counseling given: Not Answered   Clinical Intake:  Pre-visit preparation  completed: Yes  Pain : No/denies pain  Nutritional Risks: None Diabetes: Yes CBG done?: No Did pt. bring in CBG monitor from home?: No  How often do you need to have someone help you when you read instructions, pamphlets, or other written materials from your doctor or pharmacy?: 1 - Never  Interpreter Needed?: No  Information entered by :: Donne Anon, CMA   Activities of Daily Living    10/15/2022   11:20 AM  In your present state of health, do you have any difficulty performing the following activities:  Hearing? 0  Vision? 0  Difficulty concentrating or making decisions? 0  Walking or climbing stairs? 0  Dressing or bathing? 0  Doing errands, shopping? 0  Preparing Food and eating ? N  Using the Toilet? N  In the past six months, have you accidently leaked urine? N  Do you have problems with loss of bowel control? N  Managing your Medications? N  Managing your Finances? N  Housekeeping or managing your Housekeeping? N    Patient Care Team: Bradd Canary, MD as PCP - General (Family Medicine) Dorena Cookey, MD (Inactive) as Consulting Physician (Gastroenterology) Arminda Resides, MD as Consulting Physician (Dermatology)  Indicate any recent Medical Services you may have received from other than [facility] providers in the past year (date may be  approximate).     Assessment:   This is a routine wellness examination for [name] [name].  Hearing/Vision screen No results found.   Goals Addressed   None    Depression Screen    10/22/2022    8:23 AM 08/30/2022    9:04 AM 04/22/2022    8:24 AM 12/31/2021    8:48 AM 10/20/2021    8:27 AM 09/10/2021    9:11 AM 10/16/2020    8:26 AM  PHQ 2/9 Scores  PHQ - 2 Score 0 0 2 2 0 6 0  PHQ- 9 Score  0 4 8  20      Fall Risk    10/15/2022   11:20 AM 04/22/2022    8:26 AM 04/22/2022    8:24 AM 12/31/2021    8:36 AM 10/20/2021    8:27 AM  Fall Risk   Falls in the past year? 0 0 0 0 0  Number falls in past yr: 0 0 0 0 0  Injury with Fall? 0 1 0  0  Risk for fall due to : No Fall Risks    No Fall Risks  Follow up Falls evaluation completed Falls evaluation completed Falls evaluation completed  Falls evaluation completed    MEDICARE RISK AT HOME: Medicare Risk at Home Any stairs in or around the home?: Yes If so, are there any without handrails?: Yes Home free of loose throw rugs in walkways, pet beds, electrical cords, etc?: Yes Adequate lighting in your home to reduce risk of falls?: Yes Life alert?: No Use of a cane, walker or w/c?: No Grab bars in the bathroom?: Yes Shower chair or bench in shower?: No Elevated toilet seat or a handicapped toilet?: Yes  TIMED UP AND GO:  Was the test performed?  No    Cognitive Function:        10/22/2022    8:25 AM 10/20/2021    8:34 AM  6CIT Screen  What Year? 0 points 0 points  What month? 0 points 0 points  What time? 0 points 0 points  Count back from 20 0 points 0 points  Months in reverse 0 points 0  points  Repeat phrase 0 points 0 points  Total Score 0 points 0 points    Immunizations Immunization History  Administered Date(s) Administered   Influenza,inj,Quad PF,6+ Mos 12/05/2012, 12/13/2013, 03/11/2015, 12/15/2017, 12/31/2021   Influenza,inj,quad, With Preservative 12/20/2016, 11/22/2018   Influenza-Unspecified 12/20/2016,  12/31/2019, 12/17/2020   PFIZER(Purple Top)SARS-COV-2 Vaccination 03/27/2019, 04/21/2019, 01/30/2020   Pneumococcal Conjugate-13 06/13/2015, 03/09/2021   Pneumococcal Polysaccharide-23 02/15/2009, 01/29/2020   Td 02/16/2007   Tdap 02/21/2017   Zoster Recombinant(Shingrix) 02/21/2017, 08/22/2017   Zoster, Live 06/13/2015    TDAP status: Up to date  Flu Vaccine status: Due, Education has been provided regarding the importance of this vaccine. Advised may receive this vaccine at local pharmacy or Health Dept. Aware to provide a copy of the vaccination record if obtained from local pharmacy or Health Dept. Verbalized acceptance and understanding.  Pneumococcal vaccine status: Up to date  Covid-19 vaccine status: Information provided on how to obtain vaccines.   Qualifies for Shingles Vaccine? Yes   Zostavax completed Yes   Shingrix Completed?: Yes  Screening Tests Health Maintenance  Topic Date Due   FOOT EXAM  09/10/2015   Diabetic kidney evaluation - Urine ACR  09/11/2022   Medicare Annual Wellness (AWV)  10/21/2022   INFLUENZA VACCINE  05/16/2023 (Originally 09/16/2022)   MAMMOGRAM  12/04/2022   HEMOGLOBIN A1C  03/02/2023   OPHTHALMOLOGY EXAM  04/29/2023   Diabetic kidney evaluation - eGFR measurement  08/30/2023   Colonoscopy  06/24/2024   DTaP/Tdap/Td (3 - Td or Tdap) 02/22/2027   Pneumonia Vaccine 24+ Years old  Completed   DEXA SCAN  Completed   Hepatitis C Screening  Completed   Zoster Vaccines- Shingrix  Completed   HPV VACCINES  Aged Out   COVID-19 Vaccine  Discontinued    Health Maintenance  Health Maintenance Due  Topic Date Due   FOOT EXAM  09/10/2015   Diabetic kidney evaluation - Urine ACR  09/11/2022   Medicare Annual Wellness (AWV)  10/21/2022    Colorectal cancer screening: Type of screening: Colonoscopy. Completed 06/25/19. Repeat every 5 years  Mammogram status: Completed 12/03/21. Repeat every year  Bone Density status: Completed 03/18/20. Results  reflect: Bone density results: OSTEOPOROSIS. Repeat every 2 years.  Lung Cancer Screening: (Low Dose CT Chest recommended if Age 60-80 years, 20 pack-year currently smoking OR have quit w/in 15years.) does not qualify.   Additional Screening:  Hepatitis C Screening: does qualify; Completed 03/11/15  Vision Screening: Recommended annual ophthalmology exams for early detection of glaucoma and other disorders of the eye. Is the patient up to date with their annual eye exam?  Yes  Who is the provider or what is the name of the office in which the patient attends annual eye exams? [facility]  If pt is not established with a provider, would they like to be referred to a provider to establish care? No .   Dental Screening: Recommended annual dental exams for proper oral hygiene  Diabetic Foot Exam: Diabetic Foot Exam: Overdue, Pt has been advised about the importance in completing this exam. Pt is scheduled for diabetic foot exam on N/a.  Community Resource Referral / Chronic Care Management: CRR required this visit?  No   CCM required this visit?  No     Plan:     I have personally reviewed and noted the following in the patient's chart:   Medical and social history Use of alcohol, tobacco or illicit drugs  Current medications and supplements including opioid prescriptions. Patient  is not currently taking opioid prescriptions. Functional ability and status Nutritional status Physical activity Advanced directives List of other physicians Hospitalizations, surgeries, and ER visits in previous 12 months Vitals Screenings to include cognitive, depression, and falls Referrals and appointments  In addition, I have reviewed and discussed with patient certain preventive protocols, quality metrics, and best practice recommendations. A written personalized care plan for preventive services as well as general preventive health recommendations were provided to patient.      Donne Anon, CMA   10/22/2022   After Visit Summary: (MyChart) Due to this being a telephonic visit, the after visit summary with patients personalized plan was offered to patient via MyChart   Nurse Notes: None

## 2022-11-10 ENCOUNTER — Other Ambulatory Visit: Payer: Self-pay | Admitting: Family Medicine

## 2022-11-10 DIAGNOSIS — I10 Essential (primary) hypertension: Secondary | ICD-10-CM

## 2022-11-18 ENCOUNTER — Other Ambulatory Visit: Payer: Self-pay | Admitting: Family Medicine

## 2022-12-06 ENCOUNTER — Ambulatory Visit
Admission: RE | Admit: 2022-12-06 | Discharge: 2022-12-06 | Disposition: A | Discharge status: Home / Self Care | Payer: Medicare Other | Source: Ambulatory Visit | Attending: Family Medicine | Admitting: Family Medicine

## 2022-12-06 DIAGNOSIS — Z1231 Encounter for screening mammogram for malignant neoplasm of breast: Secondary | ICD-10-CM

## 2023-01-03 ENCOUNTER — Other Ambulatory Visit: Payer: Self-pay | Admitting: Family Medicine

## 2023-01-03 DIAGNOSIS — E119 Type 2 diabetes mellitus without complications: Secondary | ICD-10-CM

## 2023-01-18 ENCOUNTER — Encounter: Payer: Self-pay | Admitting: Pharmacist

## 2023-01-20 NOTE — Progress Notes (Unsigned)
01/20/2023 Name: [name] [name] MRN: [id number] DOB: December 30, 1955  Chief Complaint  Patient presents with   Medication Management    [name] [name] is a 67 y.o. year old female who presented for a telephone visit.   They were referred to the pharmacist by their PCP for assistance in managing medication access.    Subjective:   Medication Access/Adherence  Current Pharmacy:  [facility] - [place], [place] - [address] [place] [address] Phone: [phone] Fax: [phone]  [id number] [place], [place] - [address] [facility] [address] [facility] [address] [place] [address] [place] [address] Phone: [phone] Fax: [phone]  [facility] - [place], [place] - [address] [facility] [address] [facility] [address] [place] [facility] [place] [address] Phone: [phone] Fax: [phone]  [facility] - [facility] [address] [place] [address] Phone: [phone] Fax: [phone]  [facility] - [place], [place] - [address] [place] [address] Phone: [phone] Fax: [phone]   Patient reports affordability concerns with their medications: Yes  - was not able to afford $45 monthly copay for Januvia. She was able to receive from [employer] patient assistance program in 2024.  Patient reports access/transportation concerns to their pharmacy: No  Patient reports adherence concerns with their medications:  Yes      Diabetes:  Current medications:  glipizide ER 10mg  - take 2 tablets = 20mg  daily and Januvia 100mg  daily   Medications tried in the past: Metformin - caused diarrhea  Current glucose readings: does not check blood glucose at home.    Objective:  Lab Results  Component Value Date   HGBA1C 7.5 (H) 08/30/2022    Lab Results  Component Value Date   CREATININE 0.72  08/30/2022   BUN 15 08/30/2022   NA 139 08/30/2022   K 4.7 08/30/2022   CL 103 08/30/2022   CO2 28 08/30/2022    Lab Results  Component Value Date   CHOL 163 08/30/2022   HDL 63.10 08/30/2022   LDLCALC 80 08/30/2022   TRIG 102.0 08/30/2022   CHOLHDL 3 08/30/2022    Medications Reviewed Today     Reviewed by Henrene Pastor, RPH-CPP (Pharmacist) on 01/20/23 at 1518  Med List Status: <None>   Medication Order Taking? Sig Documenting Provider Last Dose Status Informant  amLODipine (NORVASC) 5 MG tablet [id number]  TAKE 1 TABLET BY MOUTH DAILY Bradd Canary, MD  Active   Ascorbic Acid (VITAMIN C) 1000 MG tablet [id number] No Take 1,000 mg by mouth daily. [provider] Taking Active   aspirin 81 MG EC tablet [id number] No Take 81 mg by mouth daily. [provider] Taking Active   calcium carbonate (OS-CAL - DOSED IN MG OF ELEMENTAL CALCIUM) 1250 (500 Ca) MG tablet [id number] No Take 1 tablet by mouth daily. [provider] Taking Active   glipiZIDE (GLUCOTROL XL) 10 MG 24 hr tablet [id number]  Take 2 tablets (20 mg total) by mouth daily with breakfast. Bradd Canary, MD  Active   JANUVIA 100 MG tablet [id number] No Take 1 tablet (100 mg total) by mouth daily. Bradd Canary, MD Taking Active            Med Note Tania Ade Jan 20, 2023  3:12 PM) 2024 - [employer] PAP  levothyroxine (SYNTHROID) 75 MCG tablet [id number]  Take 1 tablet (75 mcg total) by mouth daily before breakfast. Bradd Canary, MD  Active   lisinopril (ZESTRIL) 2.5 MG tablet [id number]  Take 1 tablet (2.5 mg total) by mouth daily. Bradd Canary, MD  Active   meclizine (ANTIVERT) 25 MG tablet [id number] No Take 1 tablet (25 mg total) by mouth 3 (three) times daily as needed for dizziness. Bradd Canary, MD Taking Active   Multiple Vitamin (MULTIVITAMIN) tablet [id number] No Take 1 tablet by mouth daily. [provider] Taking Active   rosuvastatin (CRESTOR) 5 MG tablet [id number] No  Take 1 tablet (5 mg total) by mouth daily. Bradd Canary, MD Taking Active               Assessment/Plan:   Diabetes: last A1c was 7.5% - Reviewed 2025 BCBS Medicare formulary. Cost of Januvia on her BCBS will be $45 / 30 days or $90 / 90 days thru mail order. She does have a $375 deductibel to meet at the beginning of 2025. Patient reports she cannot afford this.   - Started process for applying for 2025 medication assistance program for [employer]. Mailed application to patient today.   All patient's other medications will be $0 copay   Follow Up Plan: Will check back with patient in 2 to 3 weeks to make sure she has  completed application.   Henrene Pastor, PharmD Clinical Pharmacist [facility]

## 2023-01-31 ENCOUNTER — Telehealth: Payer: Self-pay | Admitting: Family Medicine

## 2023-01-31 NOTE — Telephone Encounter (Signed)
Pt dropped off documents for Pharmacist to fill out and send. Documents are in white small envelope. Paperwork put in front office tray under  Pharmacist name.

## 2023-02-01 ENCOUNTER — Other Ambulatory Visit: Payer: Self-pay | Admitting: Family Medicine

## 2023-02-01 DIAGNOSIS — I10 Essential (primary) hypertension: Secondary | ICD-10-CM

## 2023-02-02 ENCOUNTER — Ambulatory Visit: Payer: Medicare Other | Admitting: Pharmacist

## 2023-02-02 DIAGNOSIS — Z7984 Long term (current) use of oral hypoglycemic drugs: Secondary | ICD-10-CM

## 2023-02-02 DIAGNOSIS — E785 Hyperlipidemia, unspecified: Secondary | ICD-10-CM

## 2023-02-02 DIAGNOSIS — E119 Type 2 diabetes mellitus without complications: Secondary | ICD-10-CM

## 2023-02-02 MED ORDER — ROSUVASTATIN CALCIUM 5 MG PO TABS: 5.0000 mg | ORAL_TABLET | Freq: Every day | ORAL | 1 refills | Status: DC

## 2023-02-02 NOTE — Telephone Encounter (Signed)
Provider portion of paperwork completed - forward to PCP to review and sign. Will then need to be MAILED to [employer] patient assistance program - envelope with address was included with original application.

## 2023-02-02 NOTE — Progress Notes (Signed)
02/02/2023 Name: [name] [name] MRN: [id number] DOB: 11/04/1955  Chief Complaint  Patient presents with   Medication Management    [name] [name] is a 67 y.o. year old female. She was referred to the pharmacist by their PCP for assistance in managing medication access.    Subjective:   Medication Access/Adherence  Current Pharmacy:  [facility] - [place], [place] - [address] [place] [address] Phone: [phone] Fax: [phone]  [facility] - [place], [place] - [address] [facility] [address] [place] [address] [facility] [place] [address] Phone: [phone] Fax: [phone]  [facility] - [facility] [address] [place] [address] Phone: [phone] Fax: [phone]  [facility] - [place], [place] - [address] [place] [address] Phone: [phone] Fax: [phone]   Patient reports affordability concerns with their medications: Yes  - was not able to afford $45 monthly copay for Januvia. She was able to receive from [employer] patient assistance program in 2024. We are in the process of applying for 2025. Patient reports access/transportation concerns to their pharmacy: No  Patient reports adherence concerns with their medications:  Yes      Diabetes:  Current medications:  glipizide ER 10mg  - take 2 tablets = 20mg  daily and Januvia 100mg  daily   Medications tried in the past: Metformin - caused diarrhea  Current glucose readings: does not check blood glucose at home.  Scr - 0.72 / eGFR > 45 mL/min   Objective:  Lab Results  Component Value Date   HGBA1C 7.5 (H) 08/30/2022    Lab Results  Component Value Date   CREATININE 0.72 08/30/2022   BUN 15 08/30/2022   NA 139 08/30/2022   K 4.7 08/30/2022   CL 103 08/30/2022   CO2 28 08/30/2022    Lab Results   Component Value Date   CHOL 163 08/30/2022   HDL 63.10 08/30/2022   LDLCALC 80 08/30/2022   TRIG 102.0 08/30/2022   CHOLHDL 3 08/30/2022    Medications Reviewed Today     Reviewed by Henrene Pastor, RPH-CPP (Pharmacist) on 02/02/23 at 0845  Med List Status: <None>   Medication Order Taking? Sig Documenting Provider Last Dose Status Informant  amLODipine (NORVASC) 5 MG tablet [id number]  Take 1 tablet (5 mg total) by mouth daily. Needs appt Bradd Canary, MD  Active   Ascorbic Acid (VITAMIN C) 1000 MG tablet [id number] No Take 1,000 mg by mouth daily. [provider] Taking Active   aspirin 81 MG EC tablet [id number] No Take 81 mg by mouth daily. [provider] Taking Active   calcium carbonate (OS-CAL - DOSED IN MG OF ELEMENTAL CALCIUM) 1250 (500 Ca) MG tablet [id number] No Take 1 tablet by mouth daily. [provider] Taking Active   glipiZIDE (GLUCOTROL XL) 10 MG 24 hr tablet [id number]  Take 2 tablets (20 mg total) by mouth daily with breakfast. Bradd Canary, MD  Active   JANUVIA 100 MG tablet [id number] No Take 1 tablet (100 mg total) by mouth daily. Bradd Canary, MD Taking Active            Med Note Haze Justin   Thu Jan 20, 2023  3:12 PM) 2024 - [employer] PAP  levothyroxine (SYNTHROID) 75 MCG tablet [id number]  Take 1 tablet (75 mcg total) by mouth daily before breakfast. Abner Greenspan,  Bryon Lions, MD  Active   lisinopril (ZESTRIL) 2.5 MG tablet [id number]  Take 1 tablet (2.5 mg total) by mouth daily. Bradd Canary, MD  Active   meclizine (ANTIVERT) 25 MG tablet [id number] No Take 1 tablet (25 mg total) by mouth 3 (three) times daily as needed for dizziness. Bradd Canary, MD Taking Active   Multiple Vitamin (MULTIVITAMIN) tablet [id number] No Take 1 tablet by mouth daily. [provider] Taking Active   rosuvastatin (CRESTOR) 5 MG tablet [id number] No Take 1 tablet (5 mg total) by mouth daily. Bradd Canary, MD Taking Active                Assessment/Plan:   Diabetes: last A1c was 7.5% - Reviewed 2025 BCBS Medicare formulary. Cost of Januvia on her BCBS will be $45 / 30 days or $90 / 90 days thru mail order. She does have a $375 deductibele to meet at the beginning of 2025. Patient reports she cannot afford this.   - Received patient completed portion of 2025 [employer] medication assistance program. Completed provivder portion and forward to PCP to review and sign. Once signed, original will need to be mailed to [employer].   Medication management:  - Reviewed med list and refill history.  - Next rosuvastatin refill due around 02/09/2024 and updated Rx needed by pharmacy. Sent in updated Rx.  Next appt with PCP is 06/2023.   Meds ordered this encounter  Medications   rosuvastatin (CRESTOR) 5 MG tablet    Sig: Take 1 tablet (5 mg total) by mouth daily.    Dispense:  90 tablet    Refill:  1    Fill / Hold based on patient's refill schedule.    All patient's other medications will be $0 copay   Follow Up Plan: Will check back in 2025 to see if patient has received Janumet delivery   Henrene Pastor, PharmD Clinical Pharmacist [facility]

## 2023-02-17 ENCOUNTER — Encounter: Payer: Medicare Other | Admitting: Family Medicine

## 2023-02-25 ENCOUNTER — Other Ambulatory Visit: Payer: Medicare Other | Admitting: Pharmacist

## 2023-02-25 DIAGNOSIS — E119 Type 2 diabetes mellitus without complications: Secondary | ICD-10-CM

## 2023-02-25 DIAGNOSIS — M25561 Pain in right knee: Secondary | ICD-10-CM | POA: Diagnosis not present

## 2023-02-25 NOTE — Progress Notes (Signed)
 02/25/2023 Name: [name] [name] MRN: [id number] DOB: Mar 06, 1955  Chief Complaint  Patient presents with   Medication Management    ANDERSON COPPOCK is a 68 y.o. year old female. She was referred to the pharmacist by their PCP for assistance in managing medication access.  Spoke with patient and [employer] medication assistance program by phone today.    Subjective:   Medication Access/Adherence  Current Pharmacy:  [facility] - [place], [place] - [address] [place] [address] Phone: [phone] Fax: [phone]  [facility] - [place], [place] - [address] [facility] [place] [address] Phone: [phone] Fax: [phone]  [facility] - [facility] [address] [place] [address] Phone: [phone] Fax: [phone]  [facility] - [place]  - [place], [place] - [name] [place] [address] Phone: [phone] Fax: [phone]   Patient reports affordability concerns with their medications: Yes  - was not able to afford $45 monthly copay for Januvia . She was able to receive from [employer] patient assistance program in 2024.  We have sent in application for 2025 - pending. Patient reports access/transportation concerns to their pharmacy: No  Patient reports adherence concerns with their medications:  Yes      Diabetes:  Current medications:  glipizide  ER 10mg  - take 2 tablets = 20mg  daily and Januvia  100mg  daily   Medications tried in the past: Metformin  - caused diarrhea  Current glucose readings: does not check blood glucose at home.  Patient did get a intraarticular steroid injection in her knee today.  Scr - 0.72 / eGFR > 45 mL/min   Objective:  Lab Results  Component Value Date   HGBA1C 7.5 (H) 08/30/2022    Lab Results  Component Value Date    CREATININE 0.72 08/30/2022   BUN 15 08/30/2022   NA 139 08/30/2022   K 4.7 08/30/2022   CL 103 08/30/2022   CO2 28 08/30/2022    Lab Results  Component Value Date   CHOL 163 08/30/2022   HDL 63.10 08/30/2022   LDLCALC 80 08/30/2022   TRIG 102.0 08/30/2022   CHOLHDL 3 08/30/2022    Medications Reviewed Today     Reviewed by Carla Milling, RPH-CPP (Pharmacist) on 02/25/23 at 1456  Med List Status: <None>   Medication Order Taking? Sig Documenting Provider Last Dose Status Informant  amLODipine  (NORVASC ) 5 MG tablet [id number] Yes Take 1 tablet (5 mg total) by mouth daily. Needs appt Domenica Harlene LABOR, MD Taking Active   Ascorbic Acid (VITAMIN C) 1000 MG tablet [id number] Yes Take 1,000 mg by mouth daily. [provider] Taking Active   aspirin  81 MG EC tablet [id number] Yes Take 81 mg by mouth daily. [provider] Taking Active   calcium  carbonate (OS-CAL - DOSED IN MG OF ELEMENTAL CALCIUM ) 1250 (500 Ca) MG tablet [id number] Yes Take 1 tablet by mouth daily. [provider] Taking Active   glipiZIDE  (GLUCOTROL  XL) 10 MG 24 hr tablet [id number] Yes Take 2 tablets (20 mg total) by mouth daily with breakfast. Domenica Harlene LABOR, MD Taking Active   JANUVIA  100 MG tablet [id number] Yes Take 1 tablet (100 mg total) by mouth daily. Domenica Harlene LABOR, MD Taking Active            Med Note JUSTINO MILLING KATHEE Charlotte Jan 20, 2023  3:12  PM) 2024 - [employer] PAP  levothyroxine  (SYNTHROID ) 75 MCG tablet [id number] Yes Take 1 tablet (75 mcg total) by mouth daily before breakfast. Domenica Harlene LABOR, MD Taking Active   lisinopril  (ZESTRIL ) 2.5 MG tablet [id number] Yes Take 1 tablet (2.5 mg total) by mouth daily. Domenica Harlene LABOR, MD Taking Active   meclizine  (ANTIVERT ) 25 MG tablet [id number] Yes Take 1 tablet (25 mg total) by mouth 3 (three) times daily as needed for dizziness. Domenica Harlene LABOR, MD Taking Active   Multiple Vitamin (MULTIVITAMIN) tablet [id number] Yes Take 1 tablet by mouth daily. [provider] Taking Active   rosuvastatin  (CRESTOR ) 5 MG tablet [id number] Yes Take 1 tablet (5 mg total) by mouth daily. Domenica Harlene LABOR, MD Taking Active               Assessment/Plan:   Diabetes: last A1c was 7.5% - Reviewed 2025 BCBS Medicare formulary. Cost of Januvia  on her BCBS will be $45 / 30 days or $90 / 90 days thru mail order. She does have a $375 deductibele to meet at the beginning of 2025. Patient reports she cannot afford this.   - Spoke with [employer] medication assistance program representative patient has been conditionally approved. They mailed attestation to her 02/15/2023, once she signs and returns to medication assistance program, they will process her next refill (patient endorses she has about 30 DS of Januvia  on hand currently). Patient is to call me if she does not receive attestation in the mail by 03/02/2023.  - Patient is advised she might see an increase in blood glucose the next few days due to steroid injection.    Medication management:  - Reviewed med list and refill history.  - All patient's other medications will be $0 copay   Follow Up Plan: Will check back in 2 to 3 weeks   Madelin Ray, PharmD Clinical Pharmacist [facility]

## 2023-03-09 ENCOUNTER — Telehealth: Payer: Self-pay | Admitting: Pharmacist

## 2023-03-09 NOTE — Telephone Encounter (Signed)
Called patient to check on [employer] medication assistance program for Januvia,. [name] [name] confirms that she has received attestation and has signed and sent back to [employer] by mail. Called [employer] to see if they had received. They have not received yet attestation yet. They anticipate that it will take about 7 business days for them to receive after patient mails it. She mailed it 03/02/2023 - so expect to start processing around 03/14/2023. Patient will try to drop it by the office and I will fax if she bring in - this might speed up the process.

## 2023-03-10 ENCOUNTER — Telehealth: Payer: Self-pay | Admitting: Family Medicine

## 2023-03-10 ENCOUNTER — Encounter: Payer: Self-pay | Admitting: Family Medicine

## 2023-03-10 ENCOUNTER — Ambulatory Visit: Payer: Medicare Other | Admitting: Family Medicine

## 2023-03-10 VITALS — BP 120/79 | HR 99 | Temp 98.3°F | Ht 67.0 in | Wt 200.0 lb

## 2023-03-10 DIAGNOSIS — J029 Acute pharyngitis, unspecified: Secondary | ICD-10-CM | POA: Diagnosis not present

## 2023-03-10 DIAGNOSIS — J069 Acute upper respiratory infection, unspecified: Secondary | ICD-10-CM | POA: Diagnosis not present

## 2023-03-10 LAB — POCT RAPID STREP A (OFFICE): Rapid Strep A Screen: NEGATIVE

## 2023-03-10 LAB — POCT INFLUENZA A/B
Influenza A, POC: NEGATIVE
Influenza B, POC: NEGATIVE

## 2023-03-10 LAB — POC COVID19 BINAXNOW: SARS Coronavirus 2 Ag: NEGATIVE

## 2023-03-10 MED ORDER — LIDOCAINE VISCOUS HCL 2 % MT SOLN: 15.0000 mL | OROMUCOSAL | 0 refills | Status: DC | PRN

## 2023-03-10 MED ORDER — GUAIFENESIN ER 600 MG PO TB12: 1200.0000 mg | ORAL_TABLET | Freq: Two times a day (BID) | ORAL | 0 refills | Status: DC

## 2023-03-10 MED ORDER — BENZONATATE 100 MG PO CAPS: 100.0000 mg | ORAL_CAPSULE | Freq: Two times a day (BID) | ORAL | 0 refills | Status: DC | PRN

## 2023-03-10 NOTE — Progress Notes (Signed)
Acute Office Visit  Subjective:     Patient ID: [name] [name], female    DOB: Aug 02, 1955, 68 y.o.   MRN: [id number]  Chief Complaint  Patient presents with   Sore Throat    Patient is in today for URI symptoms.   Discussed the use of AI scribe software for clinical note transcription with the patient, who gave verbal consent to proceed.  History of Present Illness   The patient presented with symptoms starting on Monday afternoon, initially manifesting as laryngitis with voice loss and a sore throat. The patient initially dismissed the symptoms as minor, but they progressively worsened, and a fever developed. The most significant symptom was a severe sore throat, rated as high on a scale of zero to ten. The patient attempted to alleviate the symptoms with gargling and over-the-counter medications, including Mucinex and cough and cold remedies, but found no relief.  The patient also reported a dark, runny nose and coughing up a small amount of white mucus. Despite these symptoms, the patient denied experiencing headaches, ear pain, body aches, chills, or fatigue. The discomfort was localized to the throat area, described as a constant burning sensation, exacerbated by swallowing.  The patient's blood pressure was noted to be high, which they attributed to the over-the-counter medications they had been taking. They have a home monitoring device for blood pressure.              ROS All review of systems negative except what is listed in the HPI      Objective:    BP 120/79   Pulse 99   Temp 98.3 F (36.8 C) (Oral)   Ht 5\' 7"  (1.702 m)   Wt 200 lb (90.7 kg)   SpO2 98%   BMI 31.32 kg/m     Physical Exam Vitals reviewed.  Constitutional:      General: She is not in acute distress.    Appearance: She is well-developed. She is ill-appearing.  HENT:     Head: Normocephalic and atraumatic.     Right Ear: Tympanic membrane normal.     Left Ear: Tympanic membrane  normal.     Nose: Congestion and rhinorrhea present.     Mouth/Throat:     Mouth: Mucous membranes are moist.     Pharynx: Posterior oropharyngeal erythema present. No pharyngeal swelling.     Comments: Postnasal drainage; prior tonsillectomy  Eyes:     Conjunctiva/sclera: Conjunctivae normal.  Neck:     Thyroid: No thyromegaly.  Cardiovascular:     Rate and Rhythm: Regular rhythm. Tachycardia present.  Lymphadenopathy:     Cervical: No cervical adenopathy.  Skin:    General: Skin is warm and dry.  Neurological:     Mental Status: She is alert and oriented to person, place, and time.  Psychiatric:        Mood and Affect: Mood normal.        Behavior: Behavior normal.        Results for orders placed or performed in visit on 03/10/23  POC COVID-19 BinaxNow  Result Value Ref Range   SARS Coronavirus 2 Ag Negative Negative  POCT Influenza A/B  Result Value Ref Range   Influenza A, POC Negative Negative   Influenza B, POC Negative Negative  POCT rapid strep A  Result Value Ref Range   Rapid Strep A Screen Negative Negative        Assessment & Plan:   Problem List Items Addressed This Visit  None Visit Diagnoses       Viral URI    -  Primary   Relevant Medications   lidocaine (XYLOCAINE) 2 % solution   benzonatate (TESSALON) 100 MG capsule   guaiFENesin (MUCINEX) 600 MG 12 hr tablet     Sore throat       Relevant Orders   POC COVID-19 BinaxNow (Completed)   POCT Influenza A/B (Completed)   POCT rapid strep A (Completed)      Flu, COVID, and Strep testing negative today Likely Viral Upper Respiratory Infection  Sending in Tessalon, Mucinex, and lidocaine for symptom management.  Continue supportive measures including rest, hydration, humidifier use, steam showers, warm compresses to sinuses, warm liquids with lemon and honey, and over-the-counter cough, cold, and analgesics as needed. If symptoms persist 8-10 days, become severe, or return after a few days  of feeling better, then please follow-up for repeat evaluation to determine if antibiotics may be necessary.    Meds ordered this encounter  Medications   lidocaine (XYLOCAINE) 2 % solution    Sig: Use as directed 15 mLs in the mouth or throat every 4 (four) hours as needed for mouth pain.    Dispense:  100 mL    Refill:  0    Supervising Provider:   Danise Edge A [[id number]]   benzonatate (TESSALON) 100 MG capsule    Sig: Take 1 capsule (100 mg total) by mouth 2 (two) times daily as needed for cough.    Dispense:  20 capsule    Refill:  0    Supervising Provider:   Danise Edge A [[id number]]   guaiFENesin (MUCINEX) 600 MG 12 hr tablet    Sig: Take 2 tablets (1,200 mg total) by mouth 2 (two) times daily.    Dispense:  30 tablet    Refill:  0    Supervising Provider:   Danise Edge A [[id number]]    Return if symptoms worsen or fail to improve.  Clayborne Dana, NP

## 2023-03-10 NOTE — Patient Instructions (Signed)
Flu, COVID, and Strep testing negative today Likely Viral Upper Respiratory Infection  Continue supportive measures including rest, hydration, humidifier use, steam showers, warm compresses to sinuses, warm liquids with lemon and honey, and over-the-counter cough, cold, and analgesics as needed. If symptoms persist 8-10 days, become severe, or return after a few days of feeling better, then please follow-up for repeat evaluation to determine if antibiotics may be necessary.  Over the counter medications that may be helpful for symptoms:  Guaifenesin 1200 mg extended release tabs twice daily, with plenty of water For cough and congestion Brand name: Mucinex   Pseudoephedrine 30 mg, one or two tabs every 4 to 6 hours For sinus congestion Brand name: Sudafed You must get this from the pharmacy counter.  Oxymetazoline nasal spray each morning, one spray in each nostril, for NO MORE THAN 3 days  For nasal and sinus congestion Brand name: Afrin Saline nasal spray or Saline Nasal Irrigation (Netti Pot, etc) 3-5 times a day For nasal and sinus congestion Brand names: Ocean or AYR Fluticasone nasal spray OR Mometasone nasal spray OR Triamcinolone Acetonide nasal spray - follow directions on the packaging For nasal and sinus congestion Brand name: Flonase, Nasonex, Nasacort Warm salt water gargles  For sore throat Every few hours as needed Alternate ibuprofen 400-600 mg and acetaminophen 1000 mg every 6 hours For fever, body aches, headache Brand names: Motrin or Advil and Tylenol Dextromethorphan 12-hour cough version 30 mg every 12 hours  For cough Brand name: Delsym Stop all other cold medications for now (Nyquil, Dayquil, Tylenol Cold, Theraflu, etc) and other non-prescription cough/cold preparations. Many of these have the same ingredients listed above and could cause an overdose of medication.   Herbal treatments that have been shown to be helpful in some patients include: Vitamin C  1000 mg per day Zinc 100 mg per day Quercetin 25-500 mg twice a day Melatonin 5-10mg  at bedtime Honey Green Tea  General Instructions Allow your body to rest Drink PLENTY of fluids Typically, we are the most contagious 1-2 days before symptoms start through the first 2-3 days of most severe symptoms. Per CDC guidelines, you can return to school/work when symptoms have started to improve and you have been fever-free for 24 hours. However, recommend you continue extra precautions for the following 5 days (frequent hand hygiene, masking, covering coughs/sneezes, minimize exposure to immunocompromised individuals, etc).  If you develop severe shortness of breath, uncontrolled fevers, coughing up blood, confusion, chest pain, or signs of dehydration (such as significantly decreased urine amounts or dizziness with standing) please go to the nearest ER.

## 2023-03-10 NOTE — Telephone Encounter (Signed)
Pt dropped off document to be filled out by pharmacist. (Document in small white envelope-Attestation Form) Document put at front office tray under Pharmacist name.

## 2023-03-14 NOTE — Telephone Encounter (Signed)
Patient dropped off [employer] form. She had mailed a copy to them 03/02/2023 but was not complete. It is not updated and faxed to [employer] today.

## 2023-04-06 ENCOUNTER — Telehealth: Payer: Self-pay

## 2023-04-06 NOTE — Telephone Encounter (Signed)
 PAP: Patient assistance application for Januvia has been approved by PAP Companies: [employer] from 03/17/2023 to 02/15/2024. Medication should be delivered to PAP Delivery: Home. For further shipping updates, please contact [employer] at [phone]. Patient ID is: not provided   Shipment update: Delivered 03-22-2023 90 DS

## 2023-04-25 DIAGNOSIS — M25561 Pain in right knee: Secondary | ICD-10-CM | POA: Diagnosis not present

## 2023-05-02 DIAGNOSIS — H2513 Age-related nuclear cataract, bilateral: Secondary | ICD-10-CM | POA: Diagnosis not present

## 2023-05-02 LAB — HM DIABETES EYE EXAM

## 2023-05-09 DIAGNOSIS — M25561 Pain in right knee: Secondary | ICD-10-CM | POA: Diagnosis not present

## 2023-05-10 ENCOUNTER — Encounter: Payer: Medicare Other | Admitting: Family Medicine

## 2023-05-15 DIAGNOSIS — M25561 Pain in right knee: Secondary | ICD-10-CM | POA: Diagnosis not present

## 2023-05-23 DIAGNOSIS — M170 Bilateral primary osteoarthritis of knee: Secondary | ICD-10-CM | POA: Diagnosis not present

## 2023-06-06 DIAGNOSIS — M1711 Unilateral primary osteoarthritis, right knee: Secondary | ICD-10-CM | POA: Diagnosis not present

## 2023-06-08 DIAGNOSIS — K080 Exfoliation of teeth due to systemic causes: Secondary | ICD-10-CM | POA: Diagnosis not present

## 2023-06-13 DIAGNOSIS — M25561 Pain in right knee: Secondary | ICD-10-CM | POA: Diagnosis not present

## 2023-06-17 DIAGNOSIS — M25561 Pain in right knee: Secondary | ICD-10-CM | POA: Diagnosis not present

## 2023-06-19 NOTE — Assessment & Plan Note (Signed)
 Supplement and monitor

## 2023-06-19 NOTE — Assessment & Plan Note (Signed)
 On Levothyroxine, continue to monitor

## 2023-06-19 NOTE — Assessment & Plan Note (Signed)
 Well controlled, no changes to meds. Encouraged heart healthy diet such as the DASH diet and exercise as tolerated.

## 2023-06-19 NOTE — Assessment & Plan Note (Signed)
 Encouraged to get adequate exercise, calcium and vitamin d intake

## 2023-06-19 NOTE — Assessment & Plan Note (Signed)
 hgba1c acceptable, minimize simple carbs. Increase exercise as tolerated. Continue current meds

## 2023-06-19 NOTE — Assessment & Plan Note (Signed)
 Encouraged DASH or MIND diet, decrease po intake and increase exercise as tolerated. Needs 7-8 hours of sleep nightly. Avoid trans fats, eat small, frequent meals every 4-5 hours with lean proteins, complex carbs and healthy fats. Minimize simple carbs, high fat foods and processed foods

## 2023-06-20 ENCOUNTER — Other Ambulatory Visit: Payer: Self-pay | Admitting: Family Medicine

## 2023-06-20 ENCOUNTER — Encounter: Payer: Self-pay | Admitting: Family Medicine

## 2023-06-20 ENCOUNTER — Ambulatory Visit (INDEPENDENT_AMBULATORY_CARE_PROVIDER_SITE_OTHER): Payer: Medicare Other | Admitting: Family Medicine

## 2023-06-20 VITALS — BP 126/78 | HR 86 | Temp 98.0°F | Resp 16 | Ht 67.0 in | Wt 202.0 lb

## 2023-06-20 DIAGNOSIS — Z0000 Encounter for general adult medical examination without abnormal findings: Secondary | ICD-10-CM | POA: Diagnosis not present

## 2023-06-20 DIAGNOSIS — Z7984 Long term (current) use of oral hypoglycemic drugs: Secondary | ICD-10-CM

## 2023-06-20 DIAGNOSIS — Z6831 Body mass index (BMI) 31.0-31.9, adult: Secondary | ICD-10-CM | POA: Diagnosis not present

## 2023-06-20 DIAGNOSIS — I10 Essential (primary) hypertension: Secondary | ICD-10-CM

## 2023-06-20 DIAGNOSIS — E669 Obesity, unspecified: Secondary | ICD-10-CM | POA: Diagnosis not present

## 2023-06-20 DIAGNOSIS — E038 Other specified hypothyroidism: Secondary | ICD-10-CM

## 2023-06-20 DIAGNOSIS — E119 Type 2 diabetes mellitus without complications: Secondary | ICD-10-CM

## 2023-06-20 DIAGNOSIS — Z1239 Encounter for other screening for malignant neoplasm of breast: Secondary | ICD-10-CM

## 2023-06-20 DIAGNOSIS — M810 Age-related osteoporosis without current pathological fracture: Secondary | ICD-10-CM

## 2023-06-20 DIAGNOSIS — E559 Vitamin D deficiency, unspecified: Secondary | ICD-10-CM

## 2023-06-20 LAB — CBC WITH DIFFERENTIAL/PLATELET
Basophils Absolute: 0.1 10*3/uL (ref 0.0–0.1)
Basophils Relative: 0.8 % (ref 0.0–3.0)
Eosinophils Absolute: 0.2 10*3/uL (ref 0.0–0.7)
Eosinophils Relative: 2 % (ref 0.0–5.0)
HCT: 46.5 % — ABNORMAL HIGH (ref 36.0–46.0)
Hemoglobin: 15.5 g/dL — ABNORMAL HIGH (ref 12.0–15.0)
Lymphocytes Relative: 19.6 % (ref 12.0–46.0)
Lymphs Abs: 1.9 10*3/uL (ref 0.7–4.0)
MCHC: 33.4 g/dL (ref 30.0–36.0)
MCV: 88 fl (ref 78.0–100.0)
Monocytes Absolute: 0.6 10*3/uL (ref 0.1–1.0)
Monocytes Relative: 6.4 % (ref 3.0–12.0)
Neutro Abs: 6.9 10*3/uL (ref 1.4–7.7)
Neutrophils Relative %: 71.2 % (ref 43.0–77.0)
Platelets: 233 10*3/uL (ref 150.0–400.0)
RBC: 5.28 Mil/uL — ABNORMAL HIGH (ref 3.87–5.11)
RDW: 13.1 % (ref 11.5–15.5)
WBC: 9.6 10*3/uL (ref 4.0–10.5)

## 2023-06-20 LAB — MICROALBUMIN / CREATININE URINE RATIO
Creatinine,U: 40 mg/dL
Microalb Creat Ratio: 104.9 mg/g — ABNORMAL HIGH (ref 0.0–30.0)
Microalb, Ur: 4.2 mg/dL — ABNORMAL HIGH (ref 0.0–1.9)

## 2023-06-20 LAB — COMPREHENSIVE METABOLIC PANEL WITH GFR
ALT: 16 U/L (ref 0–35)
AST: 15 U/L (ref 0–37)
Albumin: 5 g/dL (ref 3.5–5.2)
Alkaline Phosphatase: 73 U/L (ref 39–117)
BUN: 14 mg/dL (ref 6–23)
CO2: 28 meq/L (ref 19–32)
Calcium: 10.5 mg/dL (ref 8.4–10.5)
Chloride: 101 meq/L (ref 96–112)
Creatinine, Ser: 0.77 mg/dL (ref 0.40–1.20)
GFR: 78.73 mL/min (ref >60.00)
Glucose, Bld: 146 mg/dL — ABNORMAL HIGH (ref 70–99)
Potassium: 4.6 meq/L (ref 3.5–5.1)
Sodium: 139 meq/L (ref 135–145)
Total Bilirubin: 0.6 mg/dL (ref 0.2–1.2)
Total Protein: 7.2 g/dL (ref 6.0–8.3)

## 2023-06-20 LAB — LIPID PANEL
Cholesterol: 181 mg/dL (ref 0–200)
HDL: 69.2 mg/dL (ref >39.00)
LDL Cholesterol: 85 mg/dL (ref 0–99)
NonHDL: 112.13
Total CHOL/HDL Ratio: 3
Triglycerides: 137 mg/dL (ref 0.0–149.0)
VLDL: 27.4 mg/dL (ref 0.0–40.0)

## 2023-06-20 LAB — VITAMIN D 25 HYDROXY (VIT D DEFICIENCY, FRACTURES): VITD: 31.62 ng/mL (ref 30.00–100.00)

## 2023-06-20 LAB — HEMOGLOBIN A1C: Hgb A1c MFr Bld: 8.2 % — ABNORMAL HIGH (ref 4.6–6.5)

## 2023-06-20 LAB — TSH: TSH: 3.49 u[IU]/mL (ref 0.35–5.50)

## 2023-06-20 NOTE — Patient Instructions (Addendum)
 Tetanus in 2029 or sooner if injured RSV, Respiratory Syncitial Virus Vaccine, Arexvy vaccine, at pharmacy Prevnar 20 once in a year or two Preventive Care 30 Years and Older, Female Preventive care refers to lifestyle choices and visits with your health care provider that can promote health and wellness. Preventive care visits are also called wellness exams. What can I expect for my preventive care visit? Counseling Your health care provider may ask you questions about your: Medical history, including: Past medical problems. Family medical history. Pregnancy and menstrual history. History of falls. Current health, including: Memory and ability to understand (cognition). Emotional well-being. Home life and relationship well-being. Sexual activity and sexual health. Lifestyle, including: Alcohol, nicotine or tobacco, and drug use. Access to firearms. Diet, exercise, and sleep habits. Work and work Astronomer. Sunscreen use. Safety issues such as seatbelt and bike helmet use. Physical exam Your health care provider will check your: Height and weight. These may be used to calculate your BMI (body mass index). BMI is a measurement that tells if you are at a healthy weight. Waist circumference. This measures the distance around your waistline. This measurement also tells if you are at a healthy weight and may help predict your risk of certain diseases, such as type 2 diabetes and high blood pressure. Heart rate and blood pressure. Body temperature. Skin for abnormal spots. What immunizations do I need?  Vaccines are usually given at various ages, according to a schedule. Your health care provider will recommend vaccines for you based on your age, medical history, and lifestyle or other factors, such as travel or where you work. What tests do I need? Screening Your health care provider may recommend screening tests for certain conditions. This may include: Lipid and cholesterol  levels. Hepatitis C test. Hepatitis B test. HIV (human immunodeficiency virus) test. STI (sexually transmitted infection) testing, if you are at risk. Lung cancer screening. Colorectal cancer screening. Diabetes screening. This is done by checking your blood sugar (glucose) after you have not eaten for a while (fasting). Mammogram. Talk with your health care provider about how often you should have regular mammograms. BRCA-related cancer screening. This may be done if you have a family history of breast, ovarian, tubal, or peritoneal cancers. Bone density scan. This is done to screen for osteoporosis. Talk with your health care provider about your test results, treatment options, and if necessary, the need for more tests. Follow these instructions at home: Eating and drinking  Eat a diet that includes fresh fruits and vegetables, whole grains, lean protein, and low-fat dairy products. Limit your intake of foods with high amounts of sugar, saturated fats, and salt. Take vitamin and mineral supplements as recommended by your health care provider. Do not drink alcohol if your health care provider tells you not to drink. If you drink alcohol: Limit how much you have to 0-1 drink a day. Know how much alcohol is in your drink. In the [place] one drink equals one 12 oz bottle of beer (355 mL), one 5 oz glass of wine (148 mL), or one 1 oz glass of hard liquor (44 mL). Lifestyle Brush your teeth every morning and night with fluoride toothpaste. Floss one time each day. Exercise for at least 30 minutes 5 or more days each week. Do not use any products that contain nicotine or tobacco. These products include cigarettes, chewing tobacco, and vaping devices, such as e-cigarettes. If you need help quitting, ask your health care provider. Do not use drugs. If you are  sexually active, practice safe sex. Use a condom or other form of protection in order to prevent STIs. Take aspirin only as told by your  health care provider. Make sure that you understand how much to take and what form to take. Work with your health care provider to find out whether it is safe and beneficial for you to take aspirin daily. Ask your health care provider if you need to take a cholesterol-lowering medicine (statin). Find healthy ways to manage stress, such as: Meditation, yoga, or listening to music. Journaling. Talking to a trusted person. Spending time with friends and family. Minimize exposure to UV radiation to reduce your risk of skin cancer. Safety Always wear your seat belt while driving or riding in a vehicle. Do not drive: If you have been drinking alcohol. Do not ride with someone who has been drinking. When you are tired or distracted. While texting. If you have been using any mind-altering substances or drugs. Wear a helmet and other protective equipment during sports activities. If you have firearms in your house, make sure you follow all gun safety procedures. What's next? Visit your health care provider once a year for an annual wellness visit. Ask your health care provider how often you should have your eyes and teeth checked. Stay up to date on all vaccines. This information is not intended to replace advice given to you by your health care provider. Make sure you discuss any questions you have with your health care provider. Document Revised: 07/30/2020 Document Reviewed: 07/30/2020 Elsevier Patient Education  2024 [employer].

## 2023-06-20 NOTE — Progress Notes (Signed)
 Subjective:    Patient ID: [name] [name], female    DOB: 05/31/1955, 68 y.o.   MRN: [id number]  Chief Complaint  Patient presents with   Annual Exam    Patient presents today for a physical exam.   Quality Metric Gaps    Eye exam, foot exam, urine microalbumin, A1C    HPI Discussed the use of AI scribe software for clinical note transcription with the patient, who gave verbal consent to proceed.  History of Present Illness [name] [name] is a 68 year old female with a history of diabetes who presents with knee pain and meniscus injury.  She has been experiencing ongoing knee pain, which she attributes to a meniscus injury. She is currently undergoing physical therapy twice a week and performing exercises at home to strengthen the muscles around her knee. She received a gel injection two weeks ago but is uncertain of its effectiveness at this point. She does not recall any specific incident that caused the injury but mentions it might have occurred while handling Christmas decorations. She has been dealing with knee issues since January, when she received her first cortisone shot.  She has a history of diabetes and is monitoring her blood sugar levels, which she believes are stable. No significant changes in her symptoms or overall health have been noted. She maintains a healthy lifestyle by staying active, drinking water regularly, and consuming protein every few hours.  She is dealing with the emotional impact of her husband's passing two years ago and is supporting a friend who recently lost her spouse. She has attended grief counseling at her church but is managing her grief with the help of friends.    Past Medical History:  Diagnosis Date   Allergy 2000   Back pain 06/13/2015   Diabetes mellitus    started Metformin  12/2012   History of shingles 2012   back   Hyperlipidemia    Hypertension    Hypothyroidism 07/29/2010   S/p FNA showing non-neoplastic goiter 7/12 w/  Dr Donalee Fruits    Obesity 08/06/2016   Osteoporosis 08/06/2016   Vitamin D  deficiency 08/06/2016    Past Surgical History:  Procedure Laterality Date   ABDOMINAL HYSTERECTOMY  1997   APPENDECTOMY     MASS EXCISION  03/02/2011   Procedure: EXCISION MASS;  Surgeon: Kemp Patter, MD;  Location: [facility];  Service: Orthopedics;  Laterality: Right;  Excision Cyst Interphangeal Right Thumb   OOPHORECTOMY     SHOULDER ARTHROSCOPY W/ ROTATOR CUFF REPAIR  2007   rt   TONSILLECTOMY     TRIGGER FINGER RELEASE  11/01/2011   Procedure: RELEASE TRIGGER FINGER/A-1 PULLEY;  Surgeon: Kemp Patter, MD;  Location: [facility];  Service: Orthopedics;  Laterality: Right;  right thumb, possible excision cyst    Family History  Problem Relation Age of Onset   Hypertension Mother    Diabetes Mother    Breast cancer Mother 20   Other Mother    Hypertension Father    Cancer Father        throat   Alcohol abuse Father    Cancer Maternal Aunt    Breast cancer Maternal Aunt    Cancer Maternal Grandmother    Heart disease Maternal Grandfather    Heart disease Paternal Grandmother    Breast cancer Cousin    Breast cancer Cousin    Hypertension Brother    Heart disease Brother  heart murmur   Alcohol abuse Brother     Social History   Socioeconomic History   Marital status: Widowed    Spouse name: Not on file   Number of children: Not on file   Years of education: Not on file   Highest education level: 12th grade  Occupational History   Occupation: Physiological scientist  Tobacco Use   Smoking status: Never   Smokeless tobacco: Never  Substance and Sexual Activity   Alcohol use: No   Drug use: No   Sexual activity: Yes    Partners: Male    Comment: lives with husband, works at [employer], no major dietary   Other Topics Concern   Not on file  Social History Narrative   Widowed; step daughterRegular exercise: walk 2 days a weekCaffeine use: 2 cups of coffee  daily   Social Drivers of Corporate investment banker Strain: Low Risk  (10/15/2022)   Overall Financial Resource Strain (CARDIA)    Difficulty of Paying Living Expenses: Not hard at all  Food Insecurity: No Food Insecurity (10/15/2022)   Hunger Vital Sign    Worried About Running Out of Food in the Last Year: Never true    Ran Out of Food in the Last Year: Never true  Transportation Needs: No Transportation Needs (10/15/2022)   PRAPARE - Administrator, Civil Service (Medical): No    Lack of Transportation (Non-Medical): No  Physical Activity: Insufficiently Active (10/15/2022)   Exercise Vital Sign    Days of Exercise per Week: 4 days    Minutes of Exercise per Session: 20 min  Stress: Stress Concern Present (10/15/2022)   [employer] of Occupational Health - Occupational Stress Questionnaire    Feeling of Stress : To some extent  Social Connections: Moderately Integrated (10/15/2022)   Social Connection and Isolation Panel [NHANES]    Frequency of Communication with Friends and Family: More than three times a week    Frequency of Social Gatherings with Friends and Family: Three times a week    Attends Religious Services: More than 4 times per year    Active Member of Clubs or Organizations: Yes    Attends Banker Meetings: More than 4 times per year    Marital Status: Widowed  Intimate Partner Violence: Not At Risk (10/22/2022)   Humiliation, Afraid, Rape, and Kick questionnaire    Fear of Current or Ex-Partner: No    Emotionally Abused: No    Physically Abused: No    Sexually Abused: No    Outpatient Medications Prior to Visit  Medication Sig Dispense Refill   amLODipine  (NORVASC ) 5 MG tablet Take 1 tablet (5 mg total) by mouth daily. Needs appt 90 tablet 0   Ascorbic Acid (VITAMIN C) 1000 MG tablet Take 1,000 mg by mouth daily.     aspirin 81 MG EC tablet Take 81 mg by mouth daily.     calcium  carbonate (OS-CAL - DOSED IN MG OF ELEMENTAL CALCIUM )  1250 (500 Ca) MG tablet Take 1 tablet by mouth daily.     JANUVIA  100 MG tablet Take 1 tablet (100 mg total) by mouth daily. 30 tablet 3   lisinopril  (ZESTRIL ) 2.5 MG tablet Take 1 tablet (2.5 mg total) by mouth daily. 90 tablet 1   meclizine  (ANTIVERT ) 25 MG tablet Take 1 tablet (25 mg total) by mouth 3 (three) times daily as needed for dizziness. 30 tablet 1   Multiple Vitamin (MULTIVITAMIN) tablet Take 1 tablet by  mouth daily.     rosuvastatin  (CRESTOR ) 5 MG tablet Take 1 tablet (5 mg total) by mouth daily. 90 tablet 1   glipiZIDE  (GLUCOTROL  XL) 10 MG 24 hr tablet Take 2 tablets (20 mg total) by mouth daily with breakfast. 180 tablet 1   levothyroxine  (SYNTHROID ) 75 MCG tablet Take 1 tablet (75 mcg total) by mouth daily before breakfast. 90 tablet 1   benzonatate  (TESSALON ) 100 MG capsule Take 1 capsule (100 mg total) by mouth 2 (two) times daily as needed for cough. 20 capsule 0   guaiFENesin  (MUCINEX ) 600 MG 12 hr tablet Take 2 tablets (1,200 mg total) by mouth 2 (two) times daily. 30 tablet 0   lidocaine  (XYLOCAINE ) 2 % solution Use as directed 15 mLs in the mouth or throat every 4 (four) hours as needed for mouth pain. 100 mL 0   No facility-administered medications prior to visit.    Allergies  Allergen Reactions   Codeine    Tramadol     Metformin  Diarrhea    Review of Systems  Constitutional:  Positive for malaise/fatigue. Negative for chills and fever.  HENT:  Negative for congestion and hearing loss.   Eyes:  Negative for discharge.  Respiratory:  Negative for cough, sputum production and shortness of breath.   Cardiovascular:  Negative for chest pain, palpitations and leg swelling.  Gastrointestinal:  Negative for abdominal pain, blood in stool, constipation, diarrhea, heartburn, nausea and vomiting.  Genitourinary:  Negative for dysuria, frequency, hematuria and urgency.  Musculoskeletal:  Positive for joint pain. Negative for back pain, falls and myalgias.  Skin:   Negative for rash.  Neurological:  Negative for dizziness, sensory change, loss of consciousness, weakness and headaches.  Endo/Heme/Allergies:  Negative for environmental allergies. Does not bruise/bleed easily.  Psychiatric/Behavioral:  Positive for depression. Negative for suicidal ideas. The patient is nervous/anxious. The patient does not have insomnia.        Objective:    Physical Exam Constitutional:      General: She is not in acute distress.    Appearance: Normal appearance. She is not diaphoretic.  HENT:     Head: Normocephalic and atraumatic.     Right Ear: Tympanic membrane, ear canal and external ear normal.     Left Ear: Tympanic membrane, ear canal and external ear normal.     Nose: Nose normal.     Mouth/Throat:     Mouth: Mucous membranes are moist.     Pharynx: Oropharynx is clear. No oropharyngeal exudate.  Eyes:     General: No scleral icterus.       Right eye: No discharge.        Left eye: No discharge.     Conjunctiva/sclera: Conjunctivae normal.     Pupils: Pupils are equal, round, and reactive to light.  Neck:     Thyroid : No thyromegaly.  Cardiovascular:     Rate and Rhythm: Normal rate and regular rhythm.     Heart sounds: Normal heart sounds. No murmur heard. Pulmonary:     Effort: Pulmonary effort is normal. No respiratory distress.     Breath sounds: Normal breath sounds. No wheezing or rales.  Abdominal:     General: Bowel sounds are normal. There is no distension.     Palpations: Abdomen is soft. There is no mass.     Tenderness: There is no abdominal tenderness.  Musculoskeletal:        General: No tenderness. Normal range of motion.     Cervical back:  Normal range of motion and neck supple.  Lymphadenopathy:     Cervical: No cervical adenopathy.  Skin:    General: Skin is warm and dry.     Findings: No rash.  Neurological:     General: No focal deficit present.     Mental Status: She is alert and oriented to person, place, and time.      Cranial Nerves: No cranial nerve deficit.     Coordination: Coordination normal.     Deep Tendon Reflexes: Reflexes are normal and symmetric. Reflexes normal.  Psychiatric:        Mood and Affect: Mood normal.        Behavior: Behavior normal.        Thought Content: Thought content normal.        Judgment: Judgment normal.     BP 126/78   Pulse 86   Temp 98 F (36.7 C)   Resp 16   Ht 5\' 7"  (1.702 m)   Wt 202 lb (91.6 kg)   SpO2 98%   BMI 31.64 kg/m  Wt Readings from Last 3 Encounters:  06/20/23 202 lb (91.6 kg)  03/10/23 200 lb (90.7 kg)  08/30/22 206 lb (93.4 kg)    Diabetic Foot Exam - Simple   No data filed    Lab Results  Component Value Date   WBC 9.6 06/20/2023   HGB 15.5 (H) 06/20/2023   HCT 46.5 (H) 06/20/2023   PLT 233.0 06/20/2023   GLUCOSE 146 (H) 06/20/2023   CHOL 181 06/20/2023   TRIG 137.0 06/20/2023   HDL 69.20 06/20/2023   LDLCALC 85 06/20/2023   ALT 16 06/20/2023   AST 15 06/20/2023   NA 139 06/20/2023   K 4.6 06/20/2023   CL 101 06/20/2023   CREATININE 0.77 06/20/2023   BUN 14 06/20/2023   CO2 28 06/20/2023   TSH 3.49 06/20/2023   HGBA1C 8.2 (H) 06/20/2023   MICROALBUR 4.2 (H) 06/20/2023    Lab Results  Component Value Date   TSH 3.49 06/20/2023   Lab Results  Component Value Date   WBC 9.6 06/20/2023   HGB 15.5 (H) 06/20/2023   HCT 46.5 (H) 06/20/2023   MCV 88.0 06/20/2023   PLT 233.0 06/20/2023   Lab Results  Component Value Date   NA 139 06/20/2023   K 4.6 06/20/2023   CO2 28 06/20/2023   GLUCOSE 146 (H) 06/20/2023   BUN 14 06/20/2023   CREATININE 0.77 06/20/2023   BILITOT 0.6 06/20/2023   ALKPHOS 73 06/20/2023   AST 15 06/20/2023   ALT 16 06/20/2023   PROT 7.2 06/20/2023   ALBUMIN 5.0 06/20/2023   CALCIUM  10.5 06/20/2023   GFR 78.73 06/20/2023   Lab Results  Component Value Date   CHOL 181 06/20/2023   Lab Results  Component Value Date   HDL 69.20 06/20/2023   Lab Results  Component Value Date    LDLCALC 85 06/20/2023   Lab Results  Component Value Date   TRIG 137.0 06/20/2023   Lab Results  Component Value Date   CHOLHDL 3 06/20/2023   Lab Results  Component Value Date   HGBA1C 8.2 (H) 06/20/2023       Assessment & Plan:  Essential hypertension Assessment & Plan: Well controlled, no changes to meds. Encouraged heart healthy diet such as the DASH diet and exercise as tolerated.    Orders: -     CBC with Differential/Platelet -     Comprehensive metabolic panel with GFR  Obesity, unspecified class, unspecified obesity type, unspecified whether serious comorbidity present Assessment & Plan: Encouraged DASH or MIND diet, decrease po intake and increase exercise as tolerated. Needs 7-8 hours of sleep nightly. Avoid trans fats, eat small, frequent meals every 4-5 hours with lean proteins, complex carbs and healthy fats. Minimize simple carbs, high fat foods and processed foods  Orders: -     Lipid panel  Vitamin D  deficiency Assessment & Plan: Supplement and monitor   Orders: -     VITAMIN D  25 Hydroxy (Vit-D Deficiency, Fractures)  Osteoporosis, post-menopausal Assessment & Plan: Encouraged to get adequate exercise, calcium  and vitamin d  intake   Orders: -     DG Bone Density; Future  Other specified hypothyroidism Assessment & Plan: On Levothyroxine , continue to monitor   Orders: -     TSH  Type 2 diabetes mellitus without complication, without long-term current use of insulin (HCC) Assessment & Plan: hgba1c acceptable, minimize simple carbs. Increase exercise as tolerated. Continue current meds    Orders: -     Hemoglobin A1c -     Lipid panel -     Microalbumin / creatinine urine ratio  Encounter for screening for malignant neoplasm of breast, unspecified screening modality -     3D Screening Mammogram, Left and Right; Future  Annual physical exam Assessment & Plan: Patient encouraged to maintain heart healthy diet, regular exercise, adequate  sleep. Consider daily probiotics. Take medications as prescribed. Labs ordered and reviewed. Pap in 2020 has had TAH so repeat pelvic prn. MGM 10/24 repeat this year. Dexa 2022 repeat in fall with MGM. colonoscopy     Assessment and Plan Assessment & Plan Wellness Visit Routine wellness visit with no significant changes. Discussed weight loss medications for diabetes and overall health benefits. She is hesitant due to insurance and personal preference. - Order lab work. - Schedule six-month follow-up, in-person or virtual. - Schedule annual wellness visit. - Review vaccination status: Tetanus due 2029 unless injured, consider RSV vaccine, consider Prevnar 20 in a year or two. - Order mammogram and bone density scan for end of the year. - Encourage continued hydration and protein intake.  Diabetes mellitus Diabetes management stable with no significant blood glucose fluctuations. Discussed new diabetes medications with weight loss benefits. She is considering options but not pursuing due to insurance and personal reasons. - Continue current diabetes management plan. - Monitor blood glucose levels regularly. - Consider new diabetes medications if interested in weight loss benefits.  Knee pain due to meniscus injury Chronic knee pain managed with physical therapy and recent gel injection. No surgery planned as therapy shows improvement. Discussed meniscus healing and muscle strengthening. - Continue physical therapy twice a week. - Perform home exercises twice to three times daily. - Use ice to reduce swelling. - Consider aquatic exercises when pool access is available.  Osteoporosis Osteoporosis management discussed. Plan to reassess bone density with mammogram later this year. - Order bone density scan for end of the year.     Randie Bustle, MD

## 2023-06-20 NOTE — Assessment & Plan Note (Signed)
 Patient encouraged to maintain heart healthy diet, regular exercise, adequate sleep. Consider daily probiotics. Take medications as prescribed. Labs ordered and reviewed. Pap in 2020 has had TAH so repeat pelvic prn. MGM 10/24 repeat this year. Dexa 2022 repeat in fall with MGM. colonoscopy

## 2023-06-21 ENCOUNTER — Other Ambulatory Visit: Payer: Self-pay

## 2023-06-21 DIAGNOSIS — M25561 Pain in right knee: Secondary | ICD-10-CM | POA: Diagnosis not present

## 2023-06-21 DIAGNOSIS — I10 Essential (primary) hypertension: Secondary | ICD-10-CM

## 2023-06-21 MED ORDER — LISINOPRIL 5 MG PO TABS: 5.0000 mg | ORAL_TABLET | Freq: Every day | ORAL | 1 refills | Status: DC

## 2023-06-24 DIAGNOSIS — M25561 Pain in right knee: Secondary | ICD-10-CM | POA: Diagnosis not present

## 2023-06-28 DIAGNOSIS — M25561 Pain in right knee: Secondary | ICD-10-CM | POA: Diagnosis not present

## 2023-06-30 DIAGNOSIS — M25561 Pain in right knee: Secondary | ICD-10-CM | POA: Diagnosis not present

## 2023-07-05 DIAGNOSIS — M25561 Pain in right knee: Secondary | ICD-10-CM | POA: Diagnosis not present

## 2023-07-07 DIAGNOSIS — M25561 Pain in right knee: Secondary | ICD-10-CM | POA: Diagnosis not present

## 2023-07-12 ENCOUNTER — Other Ambulatory Visit: Payer: Self-pay | Admitting: Family Medicine

## 2023-07-12 DIAGNOSIS — M25561 Pain in right knee: Secondary | ICD-10-CM | POA: Diagnosis not present

## 2023-07-14 DIAGNOSIS — M25561 Pain in right knee: Secondary | ICD-10-CM | POA: Diagnosis not present

## 2023-07-26 DIAGNOSIS — L821 Other seborrheic keratosis: Secondary | ICD-10-CM | POA: Diagnosis not present

## 2023-07-26 DIAGNOSIS — L723 Sebaceous cyst: Secondary | ICD-10-CM | POA: Diagnosis not present

## 2023-07-26 DIAGNOSIS — L905 Scar conditions and fibrosis of skin: Secondary | ICD-10-CM | POA: Diagnosis not present

## 2023-07-26 DIAGNOSIS — L738 Other specified follicular disorders: Secondary | ICD-10-CM | POA: Diagnosis not present

## 2023-07-28 DIAGNOSIS — S83231D Complex tear of medial meniscus, current injury, right knee, subsequent encounter: Secondary | ICD-10-CM | POA: Diagnosis not present

## 2023-08-03 DIAGNOSIS — S83241A Other tear of medial meniscus, current injury, right knee, initial encounter: Secondary | ICD-10-CM | POA: Diagnosis not present

## 2023-08-08 DIAGNOSIS — G8929 Other chronic pain: Secondary | ICD-10-CM | POA: Diagnosis not present

## 2023-08-08 DIAGNOSIS — M1711 Unilateral primary osteoarthritis, right knee: Secondary | ICD-10-CM | POA: Diagnosis not present

## 2023-08-09 NOTE — Progress Notes (Signed)
 [facility] Quality Team Note  Name: [name] [name] Date of Birth: 1955/07/25 MRN: [id number] Date: 08/09/2023  [facility] Quality Team has reviewed this patient's chart, please see recommendations below:  [facility] Quality Other; (CHART REVIEW FOR KIDNEY EVALUATION IN DIABETICS. ABSTRACTED BOTH COMPLIANT LABS.)

## 2023-08-11 NOTE — Progress Notes (Signed)
 [facility] Quality Team Note  Name: RASHANNA CHRISTIANA Date of Birth: May 14, 1955 MRN: [id number] Date: 08/11/2023  [facility] Quality Team has reviewed this patient's chart, please see recommendations below:  [facility] Quality Other; (CHART REVIEWED FOR GLYCEMIC STATUS ASSESSMENT. ABSTRACTED MOST RECENT A1C READING FOR GAP CLOSURE.)

## 2023-08-18 ENCOUNTER — Other Ambulatory Visit: Payer: Self-pay | Admitting: Family Medicine

## 2023-08-18 DIAGNOSIS — I10 Essential (primary) hypertension: Secondary | ICD-10-CM

## 2023-09-23 DIAGNOSIS — Z01818 Encounter for other preprocedural examination: Secondary | ICD-10-CM | POA: Insufficient documentation

## 2023-09-23 NOTE — Progress Notes (Signed)
 Subjective:      HPI: Pt is a 68 y.o. female who is here for preoperative clearance for Right Total Knee Arthroplasty, SCHEDULED FOR 12/20/2023.  1) High Risk Cardiac Conditions:  1) Recent MI - No.  2) Decompensated Heart Failure - No.  3) Unstable angina - No.  4) Symptomatic arrythmia - No.  5) Sx Valvular Disease - No.  2) Intermediate Risk Factors: Yes.   DM  2) Functional Status: > 4 mets  Yes.  [name] [name]: 34.7  3) Surgery Specific Risk: Intermediate  4) Further Noninvasive evaluation:   1) EKG -yes  EKG interpretation: Rate: 65 Rhythm: NSR No ST/T changes concerning for acute ischemia/infarct  Previous EKG: NSR EKG performed for preoperative clearance I have personally seen and reviewed the EKG, which is consistent with prior tracings. No acute concerns. [name] LITTIE Jolly, NP  09/28/23        2) Echo - No.     3) Stress Testing - Active Cardiac Disease - No.   4) CXR- yes     5)PFTs No.     5) Need for medical therapy - Statins indicated ? Yes.    New complaints: N/A  Social history:  Relevant past medical, surgical, family and social history reviewed and updated as indicated. Interim medical history since our last visit reviewed.  Allergies and medications reviewed and updated.  DATA REVIEWED: CHART IN EPIC  ROS: Negative unless specifically indicated above in HPI.    Current Outpatient Medications:    amLODipine  (NORVASC ) 5 MG tablet, Take 1 tablet (5 mg total) by mouth daily., Disp: 90 tablet, Rfl: 1   Ascorbic Acid (VITAMIN C) 1000 MG tablet, Take 1,000 mg by mouth daily., Disp: , Rfl:    aspirin 81 MG EC tablet, Take 81 mg by mouth daily., Disp: , Rfl:    calcium  carbonate (OS-CAL - DOSED IN MG OF ELEMENTAL CALCIUM ) 1250 (500 Ca) MG tablet, Take 1 tablet by mouth daily., Disp: , Rfl:    glipiZIDE  (GLUCOTROL  XL) 10 MG 24 hr tablet, TAKE 2 TABLETS BY MOUTH DAILY WITH BREAKFAST, Disp: 180 tablet, Rfl: 1   JANUVIA  100  MG tablet, Take 1 tablet (100 mg total) by mouth daily., Disp: 30 tablet, Rfl: 3   levothyroxine  (SYNTHROID ) 75 MCG tablet, TAKE 1 TABLET BY MOUTH EVERY DAY BEFORE BREAKFAST., Disp: 90 tablet, Rfl: 1   lisinopril  (ZESTRIL ) 5 MG tablet, Take 1 tablet (5 mg total) by mouth daily., Disp: 90 tablet, Rfl: 1   meclizine  (ANTIVERT ) 25 MG tablet, Take 1 tablet (25 mg total) by mouth 3 (three) times daily as needed for dizziness., Disp: 30 tablet, Rfl: 1   Multiple Vitamin (MULTIVITAMIN) tablet, Take 1 tablet by mouth daily., Disp: , Rfl:    rosuvastatin  (CRESTOR ) 5 MG tablet, Take 1 tablet (5 mg total) by mouth daily., Disp: 90 tablet, Rfl: 1      Objective:    BP 136/82 (BP Location: Right Arm, Patient Position: Sitting, Cuff Size: Normal)   Pulse 67   Temp 98.1 F (36.7 C) (Oral)   Resp 12   Ht 5' 7 (1.702 m)   Wt 201 lb 3.2 oz (91.3 kg)   SpO2 95%   BMI 31.51 kg/m   Wt Readings from Last 3 Encounters:  09/28/23 201 lb 3.2 oz (91.3 kg)  06/20/23 202 lb (91.6 kg)  03/10/23 200 lb (90.7 kg)    Physical Exam    General: No acute  distress. Awake and conversant.  Eyes: Normal conjunctiva, anicteric. Round symmetric pupils.  ENT: Hearing grossly intact. No nasal discharge.  Neck: Neck is supple. No masses or thyromegaly.  Respiratory: CTAB. Respirations are non-labored. No wheezing.  Skin: Warm. No rashes or ulcers.  Psych: Alert and oriented. Cooperative, Appropriate mood and affect, Normal judgment.  CV: RRR. No murmur. No lower extremity edema.  MSK: Normal ambulation. No clubbing or cyanosis.  Neuro:  CN II-XII grossly normal.    Assessment & Plan:  Preoperative clearance -     EKG 12-Lead -     DG Chest 2 View; Future -     Protime-INR; Future -     CBC with Differential/Platelet -     Urinalysis -     Urine Culture -     Hemoglobin A1c -     Lipid panel -     Comprehensive metabolic panel with GFR  Type 2 diabetes mellitus without complication, without long-term current  use of insulin (HCC)  I have independently evaluated patient.  [name] [name] is a 68 y.o. female who is moderate risk for a intermediate risk surgery.  There are not modifiable risk factors (smoking, etc). [name] [name]'s RCRI/ NSQIP calculation for MACE is: 0, 0.5%   DM Type 2 Pt on glipizide  20 mg daily and Januvia  100 mg daily. A1c must be <7.8 FOR joint Sx, per [place]. Repeat A1C today, if elevated, consider adding Jardiance. Pt unable to take metformin  r/t SEs.   No follow-ups on file.  [name] [name] L. [name] Garriga, DNP, AGNP-C

## 2023-09-28 ENCOUNTER — Ambulatory Visit (HOSPITAL_BASED_OUTPATIENT_CLINIC_OR_DEPARTMENT_OTHER)
Admission: RE | Admit: 2023-09-28 | Discharge: 2023-09-28 | Disposition: A | Discharge status: Home / Self Care | Source: Ambulatory Visit | Attending: Student | Admitting: Student

## 2023-09-28 ENCOUNTER — Ambulatory Visit: Admitting: Student

## 2023-09-28 ENCOUNTER — Encounter: Payer: Self-pay | Admitting: Student

## 2023-09-28 ENCOUNTER — Ambulatory Visit: Payer: Self-pay | Admitting: Student

## 2023-09-28 ENCOUNTER — Other Ambulatory Visit: Payer: Self-pay | Admitting: *Deleted

## 2023-09-28 VITALS — BP 136/82 | HR 67 | Temp 98.1°F | Resp 12 | Ht 67.0 in | Wt 201.2 lb

## 2023-09-28 DIAGNOSIS — Z7984 Long term (current) use of oral hypoglycemic drugs: Secondary | ICD-10-CM

## 2023-09-28 DIAGNOSIS — Z01818 Encounter for other preprocedural examination: Secondary | ICD-10-CM

## 2023-09-28 DIAGNOSIS — E119 Type 2 diabetes mellitus without complications: Secondary | ICD-10-CM | POA: Diagnosis not present

## 2023-09-28 DIAGNOSIS — M810 Age-related osteoporosis without current pathological fracture: Secondary | ICD-10-CM

## 2023-09-28 LAB — CBC WITH DIFFERENTIAL/PLATELET
Basophils Absolute: 0.1 K/uL (ref 0.0–0.1)
Basophils Relative: 1 % (ref 0.0–3.0)
Eosinophils Absolute: 0.2 K/uL (ref 0.0–0.7)
Eosinophils Relative: 2.4 % (ref 0.0–5.0)
HCT: 44.9 % (ref 36.0–46.0)
Hemoglobin: 14.8 g/dL (ref 12.0–15.0)
Lymphocytes Relative: 19 % (ref 12.0–46.0)
Lymphs Abs: 1.5 K/uL (ref 0.7–4.0)
MCHC: 32.9 g/dL (ref 30.0–36.0)
MCV: 87.7 fl (ref 78.0–100.0)
Monocytes Absolute: 0.6 K/uL (ref 0.1–1.0)
Monocytes Relative: 7 % (ref 3.0–12.0)
Neutro Abs: 5.7 K/uL (ref 1.4–7.7)
Neutrophils Relative %: 70.6 % (ref 43.0–77.0)
Platelets: 230 K/uL (ref 150.0–400.0)
RBC: 5.12 Mil/uL — ABNORMAL HIGH (ref 3.87–5.11)
RDW: 13.1 % (ref 11.5–15.5)
WBC: 8.1 K/uL (ref 4.0–10.5)

## 2023-09-28 LAB — COMPREHENSIVE METABOLIC PANEL WITH GFR
ALT: 16 U/L (ref 0–35)
AST: 14 U/L (ref 0–37)
Albumin: 4.6 g/dL (ref 3.5–5.2)
Alkaline Phosphatase: 58 U/L (ref 39–117)
BUN: 15 mg/dL (ref 6–23)
CO2: 28 meq/L (ref 19–32)
Calcium: 9.8 mg/dL (ref 8.4–10.5)
Chloride: 101 meq/L (ref 96–112)
Creatinine, Ser: 0.84 mg/dL (ref 0.40–1.20)
GFR: 70.79 mL/min (ref >60.00)
Glucose, Bld: 142 mg/dL — ABNORMAL HIGH (ref 70–99)
Potassium: 4.1 meq/L (ref 3.5–5.1)
Sodium: 138 meq/L (ref 135–145)
Total Bilirubin: 0.5 mg/dL (ref 0.2–1.2)
Total Protein: 6.9 g/dL (ref 6.0–8.3)

## 2023-09-28 LAB — LIPID PANEL
Cholesterol: 172 mg/dL (ref 0–200)
HDL: 64 mg/dL (ref >39.00)
LDL Cholesterol: 89 mg/dL (ref 0–99)
NonHDL: 107.72
Total CHOL/HDL Ratio: 3
Triglycerides: 93 mg/dL (ref 0.0–149.0)
VLDL: 18.6 mg/dL (ref 0.0–40.0)

## 2023-09-28 LAB — URINALYSIS, ROUTINE W REFLEX MICROSCOPIC
Bilirubin Urine: NEGATIVE
Hgb urine dipstick: NEGATIVE
Ketones, ur: NEGATIVE
Nitrite: NEGATIVE
Specific Gravity, Urine: 1.015 (ref 1.000–1.030)
Total Protein, Urine: NEGATIVE
Urine Glucose: NEGATIVE
Urobilinogen, UA: 0.2 (ref 0.0–1.0)
pH: 6 (ref 5.0–8.0)

## 2023-09-28 LAB — HEMOGLOBIN A1C: Hgb A1c MFr Bld: 7.5 % — ABNORMAL HIGH (ref 4.6–6.5)

## 2023-09-28 NOTE — Addendum Note (Signed)
 Addended by: TRECIA ALMARIE GRADE on: 09/28/2023 09:14 AM   Modules accepted: Orders

## 2023-09-29 LAB — URINE CULTURE
MICRO NUMBER:: 16825850
Result:: NO GROWTH
SPECIMEN QUALITY:: ADEQUATE

## 2023-10-24 ENCOUNTER — Ambulatory Visit: Payer: Self-pay | Admitting: Family Medicine

## 2023-10-24 ENCOUNTER — Ambulatory Visit (HOSPITAL_BASED_OUTPATIENT_CLINIC_OR_DEPARTMENT_OTHER)
Admission: RE | Admit: 2023-10-24 | Discharge: 2023-10-24 | Disposition: A | Discharge status: Home / Self Care | Source: Ambulatory Visit | Attending: Family Medicine | Admitting: Family Medicine

## 2023-10-24 DIAGNOSIS — M810 Age-related osteoporosis without current pathological fracture: Secondary | ICD-10-CM | POA: Diagnosis not present

## 2023-10-24 DIAGNOSIS — Z780 Asymptomatic menopausal state: Secondary | ICD-10-CM | POA: Diagnosis not present

## 2023-10-25 ENCOUNTER — Ambulatory Visit (INDEPENDENT_AMBULATORY_CARE_PROVIDER_SITE_OTHER)

## 2023-10-25 VITALS — Ht 67.0 in | Wt 201.0 lb

## 2023-10-25 DIAGNOSIS — Z0000 Encounter for general adult medical examination without abnormal findings: Secondary | ICD-10-CM

## 2023-10-25 NOTE — Patient Instructions (Signed)
 [name] [name],  Thank you for taking the time for your Medicare Wellness Visit. I appreciate your continued commitment to your health goals. Please review the care plan we discussed, and feel free to reach out if I can assist you further.  Medicare recommends these wellness visits once per year to help you and your care team stay ahead of potential health issues. These visits are designed to focus on prevention, allowing your provider to concentrate on managing your acute and chronic conditions during your regular appointments.  Please note that Annual Wellness Visits do not include a physical exam. Some assessments may be limited, especially if the visit was conducted virtually. If needed, we may recommend a separate in-person follow-up with your provider.  Ongoing Care Seeing your primary care provider every 3 to 6 months helps us  monitor your health and provide consistent, personalized care.   Referrals If a referral was made during today's visit and you haven't received any updates within two weeks, please contact the referred provider directly to check on the status.  Recommended Screenings:  Health Maintenance  Topic Date Due   Complete foot exam   09/10/2015   Flu Shot  09/16/2023   Mammogram  12/06/2023   Hemoglobin A1C  03/30/2024   Eye exam for diabetics  05/01/2024   Yearly kidney health urinalysis for diabetes  06/19/2024   Colon Cancer Screening  06/24/2024   Yearly kidney function blood test for diabetes  09/27/2024   Medicare Annual Wellness Visit  10/24/2024   DEXA scan (bone density measurement)  10/23/2025   DTaP/Tdap/Td vaccine (3 - Td or Tdap) 02/22/2027   Pneumococcal Vaccine for age over 11  Completed   Hepatitis C Screening  Completed   Zoster (Shingles) Vaccine  Completed   HPV Vaccine  Aged Out   Meningitis B Vaccine  Aged Out   COVID-19 Vaccine  Discontinued       10/25/2023    8:18 AM  Advanced Directives  Does Patient Have a Medical Advance Directive? No   Would patient like information on creating a medical advance directive? Yes (MAU/Ambulatory/Procedural Areas - Information given)   Advance Care Planning is important because it: Ensures you receive medical care that aligns with your values, goals, and preferences. Provides guidance to your family and loved ones, reducing the emotional burden of decision-making during critical moments.  Information on Advanced Care Planning can be found at [place]  [employer] of [facility] Advance Health Care Directives Advance Health Care Directives ([email or web address]   Vision: Annual vision screenings are recommended for early detection of glaucoma, cataracts, and diabetic retinopathy. These exams can also reveal signs of chronic conditions such as diabetes and high blood pressure.  Dental: Annual dental screenings help detect early signs of oral cancer, gum disease, and other conditions linked to overall health, including heart disease and diabetes.  Please see the attached documents for additional preventive care recommendations.

## 2023-10-25 NOTE — Progress Notes (Signed)
 Patient reviewed via MyChart.

## 2023-10-25 NOTE — Progress Notes (Signed)
 Subjective:   [name] [name] is a 68 y.o. who presents for a Medicare Wellness preventive visit.  As a reminder, Annual Wellness Visits don't include a physical exam, and some assessments may be limited, especially if this visit is performed virtually. We may recommend an in-person follow-up visit with your provider if needed.  Visit Complete: Virtual I connected with  [name] [name] on 10/25/23 by a audio enabled telemedicine application and verified that I am speaking with the correct person using two identifiers.  Patient Location: Home  Provider Location: Home Office  I discussed the limitations of evaluation and management by telemedicine. The patient expressed understanding and agreed to proceed.  Vital Signs: Because this visit was a virtual/telehealth visit, some criteria may be missing or patient reported. Any vitals not documented were not able to be obtained and vitals that have been documented are patient reported.  VideoDeclined- This patient declined Librarian, academic. Therefore the visit was completed with audio only.  Persons Participating in Visit: Patient.  AWV Questionnaire: Yes: Patient Medicare AWV questionnaire was completed by the patient on 10/21/23; I have confirmed that all information answered by patient is correct and no changes since this date.  Cardiac Risk Factors include: advanced age (>76men, >27 women);diabetes mellitus;dyslipidemia;hypertension     Objective:    Today's Vitals   10/25/23 0815  Weight: 201 lb (91.2 kg)  Height: 5' 7 (1.702 m)   Body mass index is 31.48 kg/m.     10/25/2023    8:18 AM 10/22/2022    8:24 AM 10/20/2021    8:26 AM 10/16/2020    8:24 AM 10/29/2011    1:05 PM 03/01/2011   10:58 AM  Advanced Directives  Does Patient Have a Medical Advance Directive? No Yes Yes Yes Patient has advance directive, copy not in chart  Patient has advance directive, copy not in chart   Type of Advance  Directive  Healthcare Power of [name];Living will Healthcare Power of [name];Living will Healthcare Power of [name];Living will    Does patient want to make changes to medical advance directive?  No - Patient declined No - Patient declined     Copy of Healthcare Power of Attorney in Chart?  No - copy requested No - copy requested No - copy requested    Would patient like information on creating a medical advance directive? Yes (MAU/Ambulatory/Procedural Areas - Information given)          Data saved with a previous flowsheet row definition    Current Medications (verified) Outpatient Encounter Medications as of 10/25/2023  Medication Sig   amLODipine  (NORVASC ) 5 MG tablet Take 1 tablet (5 mg total) by mouth daily.   Ascorbic Acid (VITAMIN C) 1000 MG tablet Take 1,000 mg by mouth daily.   aspirin 81 MG EC tablet Take 81 mg by mouth daily.   calcium  carbonate (OS-CAL - DOSED IN MG OF ELEMENTAL CALCIUM ) 1250 (500 Ca) MG tablet Take 1 tablet by mouth daily.   glipiZIDE  (GLUCOTROL  XL) 10 MG 24 hr tablet TAKE 2 TABLETS BY MOUTH DAILY WITH BREAKFAST   JANUVIA  100 MG tablet Take 1 tablet (100 mg total) by mouth daily.   levothyroxine  (SYNTHROID ) 75 MCG tablet TAKE 1 TABLET BY MOUTH EVERY DAY BEFORE BREAKFAST.   lisinopril  (ZESTRIL ) 5 MG tablet Take 1 tablet (5 mg total) by mouth daily.   meclizine  (ANTIVERT ) 25 MG tablet Take 1 tablet (25 mg total) by mouth 3 (three) times daily as needed for  dizziness.   Multiple Vitamin (MULTIVITAMIN) tablet Take 1 tablet by mouth daily.   rosuvastatin  (CRESTOR ) 5 MG tablet Take 1 tablet (5 mg total) by mouth daily.   No facility-administered encounter medications on file as of 10/25/2023.    Allergies (verified) Codeine, Tramadol , and Metformin    History: Past Medical History:  Diagnosis Date   Allergy 2000   Back pain 06/13/2015   Diabetes mellitus    started Metformin  12/2012   History of shingles 2012   back   Hyperlipidemia    Hypertension     Hypothyroidism 07/29/2010   S/p FNA showing non-neoplastic goiter 7/12 w/ Dr Jesus    Obesity 08/06/2016   Osteoporosis 08/06/2016   Vitamin D  deficiency 08/06/2016   Past Surgical History:  Procedure Laterality Date   ABDOMINAL HYSTERECTOMY  1997   APPENDECTOMY     MASS EXCISION  03/02/2011   Procedure: EXCISION MASS;  Surgeon: Arley JONELLE Curia, MD;  Location: [facility];  Service: Orthopedics;  Laterality: Right;  Excision Cyst Interphangeal Right Thumb   OOPHORECTOMY     SHOULDER ARTHROSCOPY W/ ROTATOR CUFF REPAIR  2007   rt   TONSILLECTOMY     TRIGGER FINGER RELEASE  11/01/2011   Procedure: RELEASE TRIGGER FINGER/A-1 PULLEY;  Surgeon: Arley JONELLE Curia, MD;  Location: [facility];  Service: Orthopedics;  Laterality: Right;  right thumb, possible excision cyst   Family History  Problem Relation Age of Onset   Hypertension Mother    Diabetes Mother    Breast cancer Mother 69   Other Mother    Hypertension Father    Cancer Father        throat   Alcohol abuse Father    Cancer Maternal Aunt    Breast cancer Maternal Aunt    Cancer Maternal Grandmother    Heart disease Maternal Grandfather    Heart disease Paternal Grandmother    Breast cancer Cousin    Breast cancer Cousin    Hypertension Brother    Heart disease Brother        heart murmur   Alcohol abuse Brother    Social History   Socioeconomic History   Marital status: Widowed    Spouse name: Not on file   Number of children: Not on file   Years of education: Not on file   Highest education level: 12th grade  Occupational History   Occupation: Physiological scientist  Tobacco Use   Smoking status: Never   Smokeless tobacco: Never  Substance and Sexual Activity   Alcohol use: No   Drug use: No   Sexual activity: Not Currently    Partners: Male    Comment: lives with husband, works at [employer], no major dietary   Other Topics Concern   Not on file  Social History Narrative   Widowed;  step daughterRegular exercise: walk 2 days a weekCaffeine use: 2 cups of coffee daily   Social Drivers of Corporate investment banker Strain: Low Risk  (10/25/2023)   Overall Financial Resource Strain (CARDIA)    Difficulty of Paying Living Expenses: Not very hard  Food Insecurity: No Food Insecurity (10/25/2023)   Hunger Vital Sign    Worried About Running Out of Food in the Last Year: Never true    Ran Out of Food in the Last Year: Never true  Transportation Needs: No Transportation Needs (10/25/2023)   PRAPARE - Transportation    Lack of Transportation (Medical): No    Lack of  Transportation (Non-Medical): No  Physical Activity: Inactive (10/25/2023)   Exercise Vital Sign    Days of Exercise per Week: 0 days    Minutes of Exercise per Session: 0 min  Stress: Stress Concern Present (10/25/2023)   [employer] of Occupational Health - Occupational Stress Questionnaire    Feeling of Stress: To some extent  Social Connections: Moderately Integrated (10/25/2023)   Social Connection and Isolation Panel    Frequency of Communication with Friends and Family: More than three times a week    Frequency of Social Gatherings with Friends and Family: Once a week    Attends Religious Services: More than 4 times per year    Active Member of [employer] or Organizations: Yes    Attends Banker Meetings: More than 4 times per year    Marital Status: Widowed    Tobacco Counseling Counseling given: Not Answered    Clinical Intake:  Pre-visit preparation completed: Yes  Pain : No/denies pain     Diabetes: Yes CBG done?: No Did pt. bring in CBG monitor from home?: No  Lab Results  Component Value Date   HGBA1C 7.5 (H) 09/28/2023   HGBA1C 8.2 (H) 06/20/2023   HGBA1C 7.5 (H) 08/30/2022     How often do you need to have someone help you when you read instructions, pamphlets, or other written materials from your doctor or pharmacy?: 1 - Never  Interpreter Needed?: No  Information  entered by :: Charmaine Bloodgood LPN   Activities of Daily Living     10/21/2023   11:45 AM  In your present state of health, do you have any difficulty performing the following activities:  Hearing? 0  Vision? 0  Difficulty concentrating or making decisions? 0  Walking or climbing stairs? 0  Dressing or bathing? 0  Doing errands, shopping? 0  Preparing Food and eating ? N  Using the Toilet? N  In the past six months, have you accidently leaked urine? N  Do you have problems with loss of bowel control? N  Managing your Medications? N  Managing your Finances? N  Housekeeping or managing your Housekeeping? N    Patient Care Team: Domenica Harlene LABOR, MD as PCP - General (Family Medicine) Joshua Sieving, MD as Consulting Physician (Dermatology) [facility] Ernie Cough, MD as Consulting Physician (Orthopedic Surgery)  I have updated your Care Teams any recent Medical Services you may have received from other providers in the past year.     Assessment:   This is a routine wellness examination for [name] [name].  Hearing/Vision screen Hearing Screening - Comments:: Denies hearing difficulties   Vision Screening - Comments:: Wears rx glasses - up to date with routine eye exams with [facility]    Goals Addressed             This Visit's Progress    Patient Stated   On track    Maintain current health       Depression Screen     10/25/2023    8:21 AM 09/28/2023    8:35 AM 06/20/2023   10:40 AM 10/22/2022    8:23 AM 08/30/2022    9:04 AM 04/22/2022    8:24 AM 12/31/2021    8:48 AM  PHQ 2/9 Scores  PHQ - 2 Score 0 0 0 0 0 2 2  PHQ- 9 Score 0 0   0 4 8    Fall Risk     10/21/2023   11:45 AM 09/28/2023  8:35 AM 06/20/2023   10:40 AM 10/15/2022   11:20 AM 04/22/2022    8:26 AM  Fall Risk   Falls in the past year? 0 0 0 0 0  Number falls in past yr: 0 0 0 0 0  Injury with Fall? 0  0 0 1  Risk for fall due to : Orthopedic patient  No Fall Risks No Fall Risks    Follow up Falls prevention discussed;Education provided;Falls evaluation completed Falls evaluation completed Falls evaluation completed Falls evaluation completed Falls evaluation completed    MEDICARE RISK AT HOME:  Medicare Risk at Home Any stairs in or around the home?: (Patient-Rptd) Yes If so, are there any without handrails?: (Patient-Rptd) No Home free of loose throw rugs in walkways, pet beds, electrical cords, etc?: (Patient-Rptd) Yes Adequate lighting in your home to reduce risk of falls?: (Patient-Rptd) Yes Life alert?: (Patient-Rptd) No Use of a cane, walker or w/c?: (Patient-Rptd) No Grab bars in the bathroom?: (Patient-Rptd) Yes Shower chair or bench in shower?: (Patient-Rptd) No Elevated toilet seat or a handicapped toilet?: (Patient-Rptd) Yes  TIMED UP AND GO:  Was the test performed?  No  Cognitive Function: 6CIT completed        10/25/2023    8:18 AM 10/22/2022    8:25 AM 10/20/2021    8:34 AM  6CIT Screen  What Year? 0 points 0 points 0 points  What month? 0 points 0 points 0 points  What time? 0 points 0 points 0 points  Count back from 20 0 points 0 points 0 points  Months in reverse 0 points 0 points 0 points  Repeat phrase 0 points 0 points 0 points  Total Score 0 points 0 points 0 points    Immunizations Immunization History  Administered Date(s) Administered   Fluzone Influenza virus vaccine,trivalent (IIV3), split virus 12/17/2009   Influenza,inj,Quad PF,6+ Mos 12/05/2012, 12/13/2013, 03/11/2015, 12/15/2017, 12/31/2021   Influenza,inj,quad, With Preservative 12/20/2016, 11/22/2018, 12/31/2021   Influenza-Unspecified 12/20/2016, 12/31/2019, 12/17/2020   PFIZER(Purple Top)SARS-COV-2 Vaccination 03/27/2019, 04/21/2019, 01/30/2020   Pneumococcal Conjugate-13 06/13/2015, 03/09/2021   Pneumococcal Polysaccharide-23 02/15/2009, 01/29/2020   Td 02/16/2007   Tdap 02/21/2017   Zoster Recombinant(Shingrix ) 02/21/2017, 08/22/2017   Zoster, Live 06/13/2015     Screening Tests Health Maintenance  Topic Date Due   FOOT EXAM  09/10/2015   Influenza Vaccine  09/16/2023   MAMMOGRAM  12/06/2023   HEMOGLOBIN A1C  03/30/2024   OPHTHALMOLOGY EXAM  05/01/2024   Diabetic kidney evaluation - Urine ACR  06/19/2024   Colonoscopy  06/24/2024   Diabetic kidney evaluation - eGFR measurement  09/27/2024   Medicare Annual Wellness (AWV)  10/24/2024   DEXA SCAN  10/23/2025   DTaP/Tdap/Td (3 - Td or Tdap) 02/22/2027   Pneumococcal Vaccine: 50+ Years  Completed   Hepatitis C Screening  Completed   Zoster Vaccines- Shingrix   Completed   HPV VACCINES  Aged Out   Meningococcal B Vaccine  Aged Out   COVID-19 Vaccine  Discontinued    Health Maintenance Items Addressed: Information provided on vaccine recommendations   Additional Screening:  Vision Screening: Recommended annual ophthalmology exams for early detection of glaucoma and other disorders of the eye. Is the patient up to date with their annual eye exam?  Yes  Who is the provider or what is the name of the office in which the patient attends annual eye exams? [facility]   Dental Screening: Recommended annual dental exams for proper oral hygiene  Community Resource Referral /  Chronic Care Management: CRR required this visit?  No   CCM required this visit?  No   Plan:    I have personally reviewed and noted the following in the patient's chart:   Medical and social history Use of alcohol, tobacco or illicit drugs  Current medications and supplements including opioid prescriptions. Patient is not currently taking opioid prescriptions. Functional ability and status Nutritional status Physical activity Advanced directives List of other physicians Hospitalizations, surgeries, and ER visits in previous 12 months Vitals Screenings to include cognitive, depression, and falls Referrals and appointments  In addition, I have reviewed and discussed with patient certain preventive  protocols, quality metrics, and best practice recommendations. A written personalized care plan for preventive services as well as general preventive health recommendations were provided to patient.   [name] [name], [place]   0/0/[id number]   After Visit Summary: (MyChart) Due to this being a telephonic visit, the after visit summary with patients personalized plan was offered to patient via MyChart   Notes: Nothing significant to report at this time.

## 2023-11-28 DIAGNOSIS — M1711 Unilateral primary osteoarthritis, right knee: Secondary | ICD-10-CM | POA: Diagnosis not present

## 2023-12-06 NOTE — Patient Instructions (Signed)
 SURGICAL WAITING ROOM VISITATION  Patients having surgery or a procedure may have no more than 2 support people in the waiting area - these visitors may rotate.    Children under the age of 50 must have an adult with them who is not the patient.  Visitors with respiratory illnesses are discouraged from visiting and should remain at home.  If the patient needs to stay at the hospital during part of their recovery, the visitor guidelines for inpatient rooms apply. Pre-op nurse will coordinate an appropriate time for 1 support person to accompany patient in pre-op.  This support person may not rotate.    Please refer to the [facility] website for the visitor guidelines for Inpatients (after your surgery is over and you are in a regular room).       Your procedure is scheduled on:  12/20/2023    Report to [facility] Main Entrance    Report to admitting at    [phone]   Call this number if you have problems the morning of surgery [phone]   Do not eat food :After Midnight.   After Midnight you may have the following liquids until ___ 0415___ AM  DAY OF SURGERY  Water Non-Citrus Juices (without pulp, NO RED-Apple, White grape, White cranberry) Black Coffee (NO MILK/CREAM OR CREAMERS, sugar ok)  Clear Tea (NO MILK/CREAM OR CREAMERS, sugar ok) regular and decaf                             Plain Jell-O (NO RED)                                           Fruit ices (not with fruit pulp, NO RED)                                     Popsicles (NO RED)                                                               Sports drinks like Gatorade (NO RED)                    The day of surgery:  Drink ONE (1) Pre-Surgery Clear Ensure or G2 at  0415AM the morning of surgery. Drink in one sitting. Do not sip.  This drink was given to you during your hospital  pre-op appointment visit. Nothing else to drink after completing the  Pre-Surgery Clear Ensure or G2.          If you have  questions, please contact your surgeon's office.       Oral Hygiene is also important to reduce your risk of infection.                                    Remember - BRUSH YOUR TEETH THE MORNING OF SURGERY WITH YOUR REGULAR TOOTHPASTE  DENTURES WILL BE REMOVED PRIOR TO SURGERY PLEASE DO NOT APPLY Poly grip  OR ADHESIVES!!!   Do NOT smoke after Midnight   Stop all vitamins and herbal supplements 7 days before surgery.   Take these medicines the morning of surgery with A SIP OF WATER: amlodipine , synthroid ,   Glucotrol - none nite before and none am of surgeyr  Januvia - none am of surgery    DO NOT TAKE ANY ORAL DIABETIC MEDICATIONS DAY OF YOUR SURGERY  Bring CPAP mask and tubing day of surgery.                              You may not have any metal on your body including hair pins, jewelry, and body piercing             Do not wear make-up, lotions, powders, perfumes/cologne, or deodorant  Do not wear nail polish including gel and S&S, artificial/acrylic nails, or any other type of covering on natural nails including finger and toenails. If you have artificial nails, gel coating, etc. that needs to be removed by a nail salon please have this removed prior to surgery or surgery may need to be canceled/ delayed if the surgeon/ anesthesia feels like they are unable to be safely monitored.   Do not shave  48 hours prior to surgery.               Men may shave face and neck.   Do not bring valuables to the hospital. [facility] IS NOT             RESPONSIBLE   FOR VALUABLES.   Contacts, glasses, dentures or bridgework may not be worn into surgery.   Bring small overnight bag day of surgery.   DO NOT BRING YOUR HOME MEDICATIONS TO THE HOSPITAL. PHARMACY WILL DISPENSE MEDICATIONS LISTED ON YOUR MEDICATION LIST TO YOU DURING YOUR ADMISSION IN THE HOSPITAL!    Patients discharged on the day of surgery will not be allowed to drive home.  Someone NEEDS to stay with you for the first  24 hours after anesthesia.   Special Instructions: Bring a copy of your healthcare power of attorney and living will documents the day of surgery if you haven't scanned them before.              Please read over the following fact sheets you were given: IF YOU HAVE QUESTIONS ABOUT YOUR PRE-OP INSTRUCTIONS PLEASE CALL [phone].   If you received a COVID test during your pre-op visit  it is requested that you wear a mask when out in public, stay away from anyone that may not be feeling well and notify your surgeon if you develop symptoms. If you test positive for Covid or have been in contact with anyone that has tested positive in the last 10 days please notify you surgeon.      Pre-operative 4 CHG Bath Instructions   You can play a key role in reducing the risk of infection after surgery. Your skin needs to be as free of germs as possible. You can reduce the number of germs on your skin by washing with CHG (chlorhexidine  gluconate) soap before surgery. CHG is an antiseptic soap that kills germs and continues to kill germs even after washing.   DO NOT use if you have an allergy to chlorhexidine /CHG or antibacterial soaps. If your skin becomes reddened or irritated, stop using the CHG and notify one of our RNs at [phone].   Please shower with the CHG soap  starting 4 days before surgery using the following schedule:     Please keep in mind the following:  DO NOT shave, including legs and underarms, starting the day of your first shower.   You may shave your face at any point before/day of surgery.  Place clean sheets on your bed the day you start using CHG soap. Use a clean washcloth (not used since being washed) for each shower. DO NOT sleep with pets once you start using the CHG.   CHG Shower Instructions:  If you choose to wash your hair and private area, wash first with your normal shampoo/soap.  After you use shampoo/soap, rinse your hair and body thoroughly to remove  shampoo/soap residue.  Turn the water OFF and apply about 3 tablespoons (45 ml) of CHG soap to a CLEAN washcloth.  Apply CHG soap ONLY FROM YOUR NECK DOWN TO YOUR TOES (washing for 3-5 minutes)  DO NOT use CHG soap on face, private areas, open wounds, or sores.  Pay special attention to the area where your surgery is being performed.  If you are having back surgery, having someone wash your back for you may be helpful. Wait 2 minutes after CHG soap is applied, then you may rinse off the CHG soap.  Pat dry with a clean towel  Put on clean clothes/pajamas   If you choose to wear lotion, please use ONLY the CHG-compatible lotions on the back of this paper.     Additional instructions for the day of surgery: DO NOT APPLY any lotions, deodorants, cologne, or perfumes.   Put on clean/comfortable clothes.  Brush your teeth.  Ask your nurse before applying any prescription medications to the skin.      CHG Compatible Lotions   Aveeno Moisturizing lotion  Cetaphil Moisturizing Cream  Cetaphil Moisturizing Lotion  Clairol Herbal Essence Moisturizing Lotion, Dry Skin  Clairol Herbal Essence Moisturizing Lotion, Extra Dry Skin  Clairol Herbal Essence Moisturizing Lotion, Normal Skin  Curel Age Defying Therapeutic Moisturizing Lotion with Alpha Hydroxy  Curel Extreme Care Body Lotion  Curel Soothing Hands Moisturizing Hand Lotion  Curel Therapeutic Moisturizing Cream, Fragrance-Free  Curel Therapeutic Moisturizing Lotion, Fragrance-Free  Curel Therapeutic Moisturizing Lotion, Original Formula  Eucerin Daily Replenishing Lotion  Eucerin Dry Skin Therapy Plus Alpha Hydroxy Crme  Eucerin Dry Skin Therapy Plus Alpha Hydroxy Lotion  Eucerin Original Crme  Eucerin Original Lotion  Eucerin Plus Crme Eucerin Plus Lotion  Eucerin TriLipid Replenishing Lotion  Keri Anti-Bacterial Hand Lotion  Keri Deep Conditioning Original Lotion Dry Skin Formula Softly Scented  Keri Deep Conditioning  Original Lotion, Fragrance Free Sensitive Skin Formula  Keri Lotion Fast Absorbing Fragrance Free Sensitive Skin Formula  Keri Lotion Fast Absorbing Softly Scented Dry Skin Formula  Keri Original Lotion  Keri Skin Renewal Lotion Keri Silky Smooth Lotion  Keri Silky Smooth Sensitive Skin Lotion  Nivea Body Creamy Conditioning Oil  Nivea Body Extra Enriched Teacher, adult education Moisturizing Lotion Nivea Crme  Nivea Skin Firming Lotion  NutraDerm 30 Skin Lotion  NutraDerm Skin Lotion  NutraDerm Therapeutic Skin Cream  NutraDerm Therapeutic Skin Lotion  ProShield Protective Hand Cream  Provon moisturizing lotion

## 2023-12-06 NOTE — Progress Notes (Addendum)
 Anesthesia Review:  PCP: [name] [name]  Cardiologist : none   PPM/ ICD: Device Orders: Rep Notified:  Chest x-ray : 09/29/23-2 view  EKG : 09/28/23  Echo : Stress test: Cardiac Cath :   Activity level: can do a flight of stairs wthout difficulty  Sleep Study/ CPAP : none  Fasting Blood Sugar :      / Checks Blood Sugar -- times a day:     DM- type 2- does not check glucose at home  Hgba1c-  12/12/23- 6.9  Glucotrol - none nite before none am of surgery  Januvia - none am of surgery   Blood Thinner/ Instructions /Last Dose: ASA / Instructions/ Last Dose :    81 mg aspirin

## 2023-12-08 ENCOUNTER — Ambulatory Visit
Admission: RE | Admit: 2023-12-08 | Discharge: 2023-12-08 | Disposition: A | Source: Ambulatory Visit | Attending: Family Medicine | Admitting: Family Medicine

## 2023-12-08 ENCOUNTER — Ambulatory Visit

## 2023-12-08 ENCOUNTER — Other Ambulatory Visit

## 2023-12-08 DIAGNOSIS — Z1239 Encounter for other screening for malignant neoplasm of breast: Secondary | ICD-10-CM

## 2023-12-12 ENCOUNTER — Encounter (HOSPITAL_COMMUNITY): Payer: Self-pay

## 2023-12-12 ENCOUNTER — Encounter (HOSPITAL_COMMUNITY)
Admission: RE | Admit: 2023-12-12 | Discharge: 2023-12-12 | Disposition: A | Discharge status: Home / Self Care | Source: Ambulatory Visit | Attending: Orthopedic Surgery | Admitting: Orthopedic Surgery

## 2023-12-12 ENCOUNTER — Other Ambulatory Visit: Payer: Self-pay

## 2023-12-12 VITALS — BP 144/74 | HR 68 | Temp 98.2°F | Resp 16 | Ht 66.5 in | Wt 197.0 lb

## 2023-12-12 DIAGNOSIS — Z01818 Encounter for other preprocedural examination: Secondary | ICD-10-CM

## 2023-12-12 DIAGNOSIS — Z7984 Long term (current) use of oral hypoglycemic drugs: Secondary | ICD-10-CM | POA: Diagnosis not present

## 2023-12-12 DIAGNOSIS — Z01812 Encounter for preprocedural laboratory examination: Secondary | ICD-10-CM | POA: Insufficient documentation

## 2023-12-12 DIAGNOSIS — Z79899 Other long term (current) drug therapy: Secondary | ICD-10-CM | POA: Diagnosis not present

## 2023-12-12 HISTORY — DX: Nausea with vomiting, unspecified: R11.2

## 2023-12-12 HISTORY — DX: Unspecified osteoarthritis, unspecified site: M19.90

## 2023-12-12 HISTORY — DX: Other specified postprocedural states: Z98.890

## 2023-12-12 LAB — CBC
HCT: 45.4 % (ref 36.0–46.0)
Hemoglobin: 14.9 g/dL (ref 12.0–15.0)
MCH: 29.6 pg (ref 26.0–34.0)
MCHC: 32.8 g/dL (ref 30.0–36.0)
MCV: 90.1 fL (ref 80.0–100.0)
Platelets: 245 K/uL (ref 150–400)
RBC: 5.04 MIL/uL (ref 3.87–5.11)
RDW: 12.6 % (ref 11.5–15.5)
WBC: 9 K/uL (ref 4.0–10.5)
nRBC: 0 % (ref 0.0–0.2)

## 2023-12-12 LAB — GLUCOSE, CAPILLARY: Glucose-Capillary: 214 mg/dL — ABNORMAL HIGH (ref 70–99)

## 2023-12-12 LAB — BASIC METABOLIC PANEL WITH GFR
Anion gap: 10 (ref 5–15)
BUN: 16 mg/dL (ref 8–23)
CO2: 26 mmol/L (ref 22–32)
Calcium: 10 mg/dL (ref 8.9–10.3)
Chloride: 103 mmol/L (ref 98–111)
Creatinine, Ser: 0.86 mg/dL (ref 0.44–1.00)
GFR, Estimated: >60 mL/min (ref >60)
Glucose, Bld: 230 mg/dL — ABNORMAL HIGH (ref 70–99)
Potassium: 4.3 mmol/L (ref 3.5–5.1)
Sodium: 138 mmol/L (ref 135–145)

## 2023-12-12 LAB — HEMOGLOBIN A1C
Hgb A1c MFr Bld: 6.9 % — ABNORMAL HIGH (ref 4.8–5.6)
Mean Plasma Glucose: 151.33 mg/dL

## 2023-12-12 LAB — SURGICAL PCR SCREEN
MRSA, PCR: NEGATIVE
Staphylococcus aureus: POSITIVE — AB

## 2023-12-13 ENCOUNTER — Other Ambulatory Visit: Payer: Self-pay | Admitting: Family Medicine

## 2023-12-13 ENCOUNTER — Ambulatory Visit: Payer: Self-pay | Admitting: Family Medicine

## 2023-12-13 DIAGNOSIS — I10 Essential (primary) hypertension: Secondary | ICD-10-CM

## 2023-12-13 DIAGNOSIS — E119 Type 2 diabetes mellitus without complications: Secondary | ICD-10-CM

## 2023-12-14 ENCOUNTER — Telehealth: Payer: Self-pay | Admitting: Pharmacist

## 2023-12-14 NOTE — Telephone Encounter (Signed)
 12/14/2023 Name: [name] [name] MRN: [id number] DOB: Apr 29, 1955  Chief Complaint  Patient presents with   Medication Management   Diabetes    [name] [name] is a 68 y.o. year old female. She was referred to the pharmacist by their PCP for assistance in managing medication access.     Subjective:   Medication Access/Adherence  Current Pharmacy:  [facility] - [place], [place] - [address] [place] [address] Phone: [phone] Fax: [phone]  [facility] - [place], [place] - [name] [facility] [place] [address] Phone: [phone] Fax: [phone]  [facility] [address] [place] [address] Phone: [phone] Fax: [phone]  [facility] - [place], [place] - [address] [place] [address] Phone: [phone] Fax: [phone]  [name] [name] [place], [place] - [address] [place] [address] Phone: [phone] Fax: [phone]   Patient reports affordability concerns with their medications: Yes  - was not able to afford $45 monthly copay for Januvia . She was able to receive from [employer] patient assistance program in 2024 and 2025 Patient reports access/transportation concerns to their pharmacy: No  Patient reports adherence concerns with their medications:  No      Diabetes:  Current medications:  glipizide  ER 10mg  - take 2 tablets = 20mg  daily and Januvia  100mg  daily   Medications tried in the past: Metformin  - caused diarrhea  Current glucose readings: does not check blood glucose at home.  Scr - 0.86 / eGFR > 45 mL/min  Macrovascular and Microvascular Risk Reduction:  Statin? yes (rosuvastatin  - last refilled 12/13/2023 fro 90 day supply); ACEi/ARB? yes (lisinopril  - last filled 12/13/2023  for 90 day supply) Last urinary albumin/creatinine ratio:  Lab Results  Component Value Date   MICRALBCREAT 104.9 (H) 06/20/2023   Last eye exam:  Lab Results  Component Value Date   HMDIABEYEEXA No Retinopathy 05/02/2023   Last foot exam: No foot exam found Tobacco Use:  Tobacco Use: Low Risk  (12/12/2023)   Patient History    Smoking Tobacco Use: Never    Smokeless Tobacco Use: Never    Passive Exposure: Not on file      Objective:  Lab Results  Component Value Date   HGBA1C 6.9 (H) 12/12/2023    Lab Results  Component Value Date   CREATININE 0.86 12/12/2023   BUN 16 12/12/2023   NA 138 12/12/2023   K 4.3 12/12/2023   CL 103 12/12/2023   CO2 26 12/12/2023    Lab Results  Component Value Date   CHOL 172 09/28/2023   HDL 64.00 09/28/2023   LDLCALC 89 09/28/2023   TRIG 93.0 09/28/2023   CHOLHDL 3 09/28/2023    Medications Reviewed Today     Reviewed by Carla Milling, RPH-CPP (Pharmacist) on 12/14/23 at 1658  Med List Status: <None>   Medication Order Taking? Sig Documenting Provider Last Dose Status Informant  acetaminophen (TYLENOL) 500 MG tablet [id number]  Take 1,000 mg by mouth every 8 (eight) hours as needed for moderate pain (pain score 4-6). [provider]  Active Self  amLODipine  (NORVASC ) 5 MG tablet [id number]  Take 1 tablet (5 mg total) by mouth daily. [name] Harlene LABOR, MD  Active Self  Ascorbic Acid (VITAMIN C) 1000 MG tablet [id number]  Take 1,000 mg by mouth daily.  [provider]  Active Self  aspirin 81 MG EC tablet [id number]  Take 81 mg by mouth daily. [provider]  Active Self  Calcium -Magnesium-Vitamin D  (CITRACAL CALCIUM  +D3 PO) [id number]  Take 1 tablet by mouth daily. [provider]  Active Self  glipiZIDE  (GLUCOTROL  XL) 10 MG 24 hr tablet [id number]  Take 2 tablets (20 mg total) by mouth daily with breakfast. [name] Harlene LABOR, MD  Active   ibuprofen (ADVIL) 200 MG tablet [id number]  Take 400 mg by mouth  every 8 (eight) hours as needed for moderate pain (pain score 4-6). [provider]  Active Self  JANUVIA  100 MG tablet [id number]  Take 1 tablet (100 mg total) by mouth daily. [name] Harlene LABOR, MD  Active   levothyroxine  (SYNTHROID ) 75 MCG tablet [id number]  TAKE 1 TABLET BY MOUTH EVERY DAY BEFORE BREAKFAST. [name] Harlene LABOR, MD  Active Self  lisinopril  (ZESTRIL ) 5 MG tablet [id number]  Take 1 tablet (5 mg total) by mouth daily. [name] Harlene LABOR, MD  Active   Multiple Vitamin (MULTIVITAMIN) tablet [id number]  Take 1 tablet by mouth daily. [provider]  Active Self  rosuvastatin  (CRESTOR ) 5 MG tablet [id number]  Take 1 tablet (5 mg total) by mouth daily. [name] Harlene LABOR, MD  Active Self              Assessment/Plan:   Diabetes: last A1c was at goal.  - Reviewed 2026 [facility] Medicare formulary. She has a $615 deductible to meet, then cost of Januvia  on her [employer] 25% of medication cost. (Cost before deductible = $138/month) and cost after deductible = $35 / month)     - Starting application for 2026 [employer] patient assistance program. Sent request to Med Assist Team.   Medication management:  - Reviewed med list and refill history.  - All patient's other medications will be $0 copay   Follow Up Plan: Will check back in 2 to 3 weeks   [name] [name], PharmD Clinical Pharmacist [facility]

## 2023-12-15 DIAGNOSIS — K080 Exfoliation of teeth due to systemic causes: Secondary | ICD-10-CM | POA: Diagnosis not present

## 2023-12-18 ENCOUNTER — Other Ambulatory Visit: Payer: Self-pay | Admitting: Family Medicine

## 2023-12-19 NOTE — H&P (Signed)
 TOTAL KNEE ADMISSION H&P  Patient is being admitted for right total knee arthroplasty.  Therapy Plans: outpatient therapy at [facility] Disposition: Home with friend and brother Planned DVT Prophylaxis: aspirin 81mg  BID DME needed: none PCP: Harlene Horton - clearance received TXA: IV Allergies: tramadol  & codeine - nausea/vomiting, metformin  - diarrhea Anesthesia Concerns: none BMI: 31.3 Last HgbA1c: 7.5% in August 2025   Other: - No hx of VTE or cancer - dilaudid , robaxin, tylenol, celebrex - avoids pain meds due to nausea/vomiting  Subjective:  Chief Complaint:right knee pain.  HPI: [name] [name], 68 y.o. female, has a history of pain and functional disability in the right knee due to arthritis and has failed non-surgical conservative treatments for greater than 12 weeks to includeNSAID's and/or analgesics, corticosteriod injections, and activity modification.  Onset of symptoms was gradual, starting 2 years ago with gradually worsening course since that time. The patient noted no past surgery on the right knee(s).  Patient currently rates pain in the right knee(s) at 8 out of 10 with activity. Patient has worsening of pain with activity and weight bearing and pain that interferes with activities of daily living.  Patient has evidence of joint space narrowing by imaging studies. There is no active infection.  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   Subclinical hyperthyroidism 11/07/2012   Right thyroid  nodule 11/07/2012   DM type 2 (diabetes mellitus, type 2) (HCC) 01/28/2011   Hyperlipidemia with  target LDL less than 100 01/28/2011   Osteoporosis, post-menopausal 01/28/2011   Annual physical exam 07/29/2010   Hypothyroidism 07/29/2010   Shingles 05/21/2010   Adjustment reaction 03/25/2010   Essential hypertension 03/25/2010   Past Medical History:  Diagnosis Date   Allergy 2000   Arthritis    Back pain 06/13/2015   Diabetes mellitus    started Metformin  12/2012   History of shingles 2012   back   Hyperlipidemia    Hypertension    Hypothyroidism 07/29/2010   S/p FNA showing non-neoplastic goiter 7/12 w/ Dr Jesus    Obesity 08/06/2016   Osteoporosis 08/06/2016   PONV (postoperative nausea and vomiting)    Vitamin D  deficiency 08/06/2016    Past Surgical History:  Procedure Laterality Date   ABDOMINAL HYSTERECTOMY  1997   APPENDECTOMY     MASS EXCISION  03/02/2011   Procedure: EXCISION MASS;  Surgeon: Arley JONELLE Curia, MD;  Location: [facility];  Service: Orthopedics;  Laterality: Right;  Excision Cyst Interphangeal Right Thumb   OOPHORECTOMY     SHOULDER ARTHROSCOPY W/ ROTATOR CUFF REPAIR  2007   rt   TONSILLECTOMY     TRIGGER FINGER RELEASE  11/01/2011   Procedure: RELEASE TRIGGER FINGER/A-1 PULLEY;  Surgeon: Arley JONELLE Curia, MD;  Location: [facility];  Service: Orthopedics;  Laterality: Right;  right thumb, possible excision cyst    No current facility-administered medications for this encounter.   Current Outpatient Medications  Medication Sig Dispense Refill Last Dose/Taking   acetaminophen (TYLENOL) 500 MG tablet Take 1,000 mg by mouth every 8 (eight) hours as needed for moderate pain (pain score  4-6).   Taking As Needed   amLODipine  (NORVASC ) 5 MG tablet Take 1 tablet (5 mg total) by mouth daily. 90 tablet 1 Taking   Ascorbic Acid (VITAMIN C) 1000 MG tablet Take 1,000 mg by mouth daily.   Taking   aspirin 81 MG EC tablet Take 81 mg by mouth daily.   Taking   Calcium -Magnesium-Vitamin D  (CITRACAL CALCIUM  +D3 PO) Take 1 tablet by mouth  daily.   Taking   ibuprofen (ADVIL) 200 MG tablet Take 400 mg by mouth every 8 (eight) hours as needed for moderate pain (pain score 4-6).   Taking As Needed   Multiple Vitamin (MULTIVITAMIN) tablet Take 1 tablet by mouth daily.   Taking   rosuvastatin  (CRESTOR ) 5 MG tablet Take 1 tablet (5 mg total) by mouth daily. 90 tablet 1 Taking   glipiZIDE  (GLUCOTROL  XL) 10 MG 24 hr tablet Take 2 tablets (20 mg total) by mouth daily with breakfast. 180 tablet 1    JANUVIA  100 MG tablet Take 1 tablet (100 mg total) by mouth daily. 90 tablet 1    levothyroxine  (SYNTHROID ) 75 MCG tablet Take 1 tablet (75 mcg total) by mouth daily before breakfast. 90 tablet 1    lisinopril  (ZESTRIL ) 5 MG tablet Take 1 tablet (5 mg total) by mouth daily. 90 tablet 1    Allergies  Allergen Reactions   Codeine Nausea And Vomiting   Tramadol  Nausea And Vomiting    Social History   Tobacco Use   Smoking status: Never   Smokeless tobacco: Never  Substance Use Topics   Alcohol use: No    Family History  Problem Relation Age of Onset   Hypertension Mother    Diabetes Mother    Breast cancer Mother 83   Other Mother    Hypertension Father    Cancer Father        throat   Alcohol abuse Father    Cancer Maternal Aunt    Breast cancer Maternal Aunt    Cancer Maternal Grandmother    Heart disease Maternal Grandfather    Heart disease Paternal Grandmother    Breast cancer Cousin    Breast cancer Cousin    Hypertension Brother    Heart disease Brother        heart murmur   Alcohol abuse Brother      Review of Systems  Constitutional:  Negative for chills and fever.  Respiratory:  Negative for cough and shortness of breath.   Cardiovascular:  Negative for chest pain.  Gastrointestinal:  Negative for nausea and vomiting.  Musculoskeletal:  Positive for arthralgias.      Objective:  Physical Exam Right knee exam: No palpable effusion, warmth erythema Slight flexion contracture with tenderness  medially Flexion close to 120 degrees with tightness and mild crepitation anteriorly Stable medial and lateral collateral ligaments Normal ipsilateral right hip exam without groin pain or referred pain  Vital signs in last 24 hours:    Labs:   Estimated body mass index is 31.32 kg/m as calculated from the following:   Height as of 12/12/23: 5' 6.5 (1.689 m).   Weight as of 12/12/23: 89.4 kg.   Imaging Review Plain radiographs demonstrate severe degenerative joint disease of the right knee(s). The overall alignment isneutral. The bone quality appears to be adequate for age and reported activity level.      Assessment/Plan:  End stage arthritis, right knee   The patient history, physical examination, clinical judgment of the provider and imaging studies  are consistent with end stage degenerative joint disease of the right knee(s) and total knee arthroplasty is deemed medically necessary. The treatment options including medical management, injection therapy arthroscopy and arthroplasty were discussed at length. The risks and benefits of total knee arthroplasty were presented and reviewed. The risks due to aseptic loosening, infection, stiffness, patella tracking problems, thromboembolic complications and other imponderables were discussed. The patient acknowledged the explanation, agreed to proceed with the plan and consent was signed. Patient is being admitted for inpatient treatment for surgery, pain control, PT, OT, prophylactic antibiotics, VTE prophylaxis, progressive ambulation and ADL's and discharge planning. The patient is planning to be discharged home.     Patient's anticipated LOS is less than 2 midnights, meeting these requirements: - Younger than 25 - Lives within 1 hour of care - Has a competent adult at home to recover with post-op recover - NO history of  - Chronic pain requiring opiods  - Diabetes  - Coronary Artery Disease  - Heart failure  - Heart  attack  - Stroke  - DVT/VTE  - Cardiac arrhythmia  - Respiratory Failure/COPD  - Renal failure  - Anemia  - Advanced Liver disease  Rosina Calin, PA-C Orthopedic Surgery [facility] [place] [phone]

## 2023-12-20 ENCOUNTER — Observation Stay (HOSPITAL_COMMUNITY)
Admission: RE | Admit: 2023-12-20 | Discharge: 2023-12-21 | Disposition: A | Discharge status: Home / Self Care | Source: Ambulatory Visit | Attending: Orthopedic Surgery | Admitting: Orthopedic Surgery

## 2023-12-20 ENCOUNTER — Other Ambulatory Visit: Payer: Self-pay

## 2023-12-20 ENCOUNTER — Encounter (HOSPITAL_COMMUNITY)
Admission: RE | Disposition: A | Discharge status: Home / Self Care | Payer: Self-pay | Source: Ambulatory Visit | Attending: Orthopedic Surgery

## 2023-12-20 ENCOUNTER — Encounter (HOSPITAL_COMMUNITY): Payer: Self-pay | Admitting: Orthopedic Surgery

## 2023-12-20 ENCOUNTER — Ambulatory Visit (HOSPITAL_COMMUNITY): Payer: Self-pay | Admitting: Physician Assistant

## 2023-12-20 ENCOUNTER — Ambulatory Visit (HOSPITAL_COMMUNITY): Payer: Self-pay

## 2023-12-20 DIAGNOSIS — M1711 Unilateral primary osteoarthritis, right knee: Principal | ICD-10-CM | POA: Insufficient documentation

## 2023-12-20 DIAGNOSIS — I10 Essential (primary) hypertension: Secondary | ICD-10-CM | POA: Insufficient documentation

## 2023-12-20 DIAGNOSIS — E039 Hypothyroidism, unspecified: Secondary | ICD-10-CM | POA: Insufficient documentation

## 2023-12-20 DIAGNOSIS — Z01818 Encounter for other preprocedural examination: Secondary | ICD-10-CM

## 2023-12-20 DIAGNOSIS — E119 Type 2 diabetes mellitus without complications: Secondary | ICD-10-CM | POA: Diagnosis not present

## 2023-12-20 DIAGNOSIS — M25561 Pain in right knee: Secondary | ICD-10-CM | POA: Diagnosis not present

## 2023-12-20 DIAGNOSIS — G8918 Other acute postprocedural pain: Secondary | ICD-10-CM | POA: Diagnosis not present

## 2023-12-20 DIAGNOSIS — Z7982 Long term (current) use of aspirin: Secondary | ICD-10-CM | POA: Diagnosis not present

## 2023-12-20 DIAGNOSIS — Z79899 Other long term (current) drug therapy: Secondary | ICD-10-CM | POA: Diagnosis not present

## 2023-12-20 DIAGNOSIS — Z96651 Presence of right artificial knee joint: Principal | ICD-10-CM

## 2023-12-20 HISTORY — PX: TOTAL KNEE ARTHROPLASTY: SHX125

## 2023-12-20 LAB — GLUCOSE, CAPILLARY
Glucose-Capillary: 200 mg/dL — ABNORMAL HIGH (ref 70–99)
Glucose-Capillary: 174 mg/dL — ABNORMAL HIGH (ref 70–99)
Glucose-Capillary: 238 mg/dL — ABNORMAL HIGH (ref 70–99)
Glucose-Capillary: 301 mg/dL — ABNORMAL HIGH (ref 70–99)

## 2023-12-20 SURGERY — ARTHROPLASTY, KNEE, TOTAL
Anesthesia: Spinal | Site: Knee | Laterality: Right

## 2023-12-20 MED ORDER — METHOCARBAMOL 500 MG PO TABS
500.0000 mg | ORAL_TABLET | Freq: Four times a day (QID) | ORAL | Status: DC | PRN
Start: 2023-12-20 — End: 2023-12-21
  Administered 2023-12-20 – 2023-12-21 (×3): 500 mg via ORAL
  Filled 2023-12-20 (×3): qty 1

## 2023-12-20 MED ORDER — HYDROMORPHONE HCL 2 MG PO TABS
1.0000 mg | ORAL_TABLET | ORAL | Status: DC | PRN
  Administered 2023-12-20 – 2023-12-21 (×2): 2 mg via ORAL
  Filled 2023-12-20 (×2): qty 1

## 2023-12-20 MED ORDER — MIDAZOLAM HCL 5 MG/5ML IJ SOLN
INTRAMUSCULAR | Status: DC | PRN
  Administered 2023-12-20 (×2): 1 mg via INTRAVENOUS

## 2023-12-20 MED ORDER — LISINOPRIL 5 MG PO TABS
5.0000 mg | ORAL_TABLET | Freq: Every day | ORAL | Status: DC
  Administered 2023-12-21: 5 mg via ORAL
  Filled 2023-12-20: qty 1

## 2023-12-20 MED ORDER — ACETAMINOPHEN 500 MG PO TABS
1000.0000 mg | ORAL_TABLET | Freq: Once | ORAL | Status: AC
  Administered 2023-12-20: 1000 mg via ORAL
  Filled 2023-12-20: qty 2

## 2023-12-20 MED ORDER — TRANEXAMIC ACID-NACL 1000-0.7 MG/100ML-% IV SOLN
1000.0000 mg | INTRAVENOUS | Status: AC
  Administered 2023-12-20: 1000 mg via INTRAVENOUS
  Filled 2023-12-20: qty 100

## 2023-12-20 MED ORDER — BUPIVACAINE-EPINEPHRINE (PF) 0.25% -1:200000 IJ SOLN
INTRAMUSCULAR | Status: AC
Start: 2023-12-20 — End: 2023-12-20
  Filled 2023-12-20: qty 30

## 2023-12-20 MED ORDER — KETOROLAC TROMETHAMINE 15 MG/ML IJ SOLN
7.5000 mg | Freq: Four times a day (QID) | INTRAMUSCULAR | Status: DC
  Administered 2023-12-20 – 2023-12-21 (×3): 7.5 mg via INTRAVENOUS
  Filled 2023-12-20 (×3): qty 1

## 2023-12-20 MED ORDER — CHLORHEXIDINE GLUCONATE 0.12 % MT SOLN
15.0000 mL | Freq: Once | OROMUCOSAL | Status: AC
  Administered 2023-12-20: 15 mL via OROMUCOSAL

## 2023-12-20 MED ORDER — PHENYLEPHRINE 80 MCG/ML (10ML) SYRINGE FOR IV PUSH (FOR BLOOD PRESSURE SUPPORT)
PREFILLED_SYRINGE | INTRAVENOUS | Status: AC
  Filled 2023-12-20: qty 10

## 2023-12-20 MED ORDER — MUPIROCIN 2 % EX OINT: 1.0000 | TOPICAL_OINTMENT | Freq: Two times a day (BID) | CUTANEOUS | 0 refills | Status: DC

## 2023-12-20 MED ORDER — HYDROMORPHONE HCL 1 MG/ML IJ SOLN
0.5000 mg | INTRAMUSCULAR | Status: DC | PRN
  Administered 2023-12-20: 1 mg via INTRAVENOUS
  Filled 2023-12-20: qty 1

## 2023-12-20 MED ORDER — SODIUM CHLORIDE 0.9 % IR SOLN
Status: DC | PRN
  Administered 2023-12-20: 1000 mL

## 2023-12-20 MED ORDER — LINAGLIPTIN 5 MG PO TABS
5.0000 mg | ORAL_TABLET | Freq: Every day | ORAL | Status: DC
  Administered 2023-12-21: 5 mg via ORAL
  Filled 2023-12-20: qty 1

## 2023-12-20 MED ORDER — KETOROLAC TROMETHAMINE 30 MG/ML IJ SOLN
INTRAMUSCULAR | Status: AC
Start: 2023-12-20 — End: 2023-12-20
  Filled 2023-12-20: qty 1

## 2023-12-20 MED ORDER — 0.9 % SODIUM CHLORIDE (POUR BTL) OPTIME
TOPICAL | Status: DC | PRN
  Administered 2023-12-20: 1000 mL

## 2023-12-20 MED ORDER — ASPIRIN 81 MG PO CHEW
81.0000 mg | CHEWABLE_TABLET | Freq: Two times a day (BID) | ORAL | Status: DC
  Administered 2023-12-20 – 2023-12-21 (×2): 81 mg via ORAL
  Filled 2023-12-20 (×2): qty 1

## 2023-12-20 MED ORDER — DEXAMETHASONE SOD PHOSPHATE PF 10 MG/ML IJ SOLN: 8.0000 mg | Freq: Once | INTRAMUSCULAR | Status: DC

## 2023-12-20 MED ORDER — PROPOFOL 500 MG/50ML IV EMUL
INTRAVENOUS | Status: DC | PRN
  Administered 2023-12-20: 80 mg via INTRAVENOUS
  Administered 2023-12-20: 125 ug/kg/min via INTRAVENOUS

## 2023-12-20 MED ORDER — METHOCARBAMOL 1000 MG/10ML IJ SOLN: 500.0000 mg | Freq: Four times a day (QID) | INTRAMUSCULAR | Status: DC | PRN

## 2023-12-20 MED ORDER — LEVOTHYROXINE SODIUM 75 MCG PO TABS
75.0000 ug | ORAL_TABLET | Freq: Every day | ORAL | Status: DC
  Administered 2023-12-21: 75 ug via ORAL
  Filled 2023-12-20: qty 1

## 2023-12-20 MED ORDER — ROSUVASTATIN CALCIUM 5 MG PO TABS
5.0000 mg | ORAL_TABLET | Freq: Every day | ORAL | Status: DC
  Administered 2023-12-21: 5 mg via ORAL
  Filled 2023-12-20: qty 1

## 2023-12-20 MED ORDER — TRANEXAMIC ACID-NACL 1000-0.7 MG/100ML-% IV SOLN
1000.0000 mg | Freq: Once | INTRAVENOUS | Status: AC
  Administered 2023-12-20: 1000 mg via INTRAVENOUS
  Filled 2023-12-20: qty 100

## 2023-12-20 MED ORDER — POVIDONE-IODINE 10 % EX SWAB: 2.0000 | Freq: Once | CUTANEOUS | Status: DC

## 2023-12-20 MED ORDER — ONDANSETRON HCL 4 MG PO TABS: 4.0000 mg | ORAL_TABLET | Freq: Four times a day (QID) | ORAL | Status: DC | PRN

## 2023-12-20 MED ORDER — PHENYLEPHRINE HCL-NACL 20-0.9 MG/250ML-% IV SOLN
INTRAVENOUS | Status: AC
  Filled 2023-12-20: qty 250

## 2023-12-20 MED ORDER — OXYCODONE HCL 5 MG/5ML PO SOLN: 5.0000 mg | Freq: Once | ORAL | Status: DC | PRN

## 2023-12-20 MED ORDER — CEFAZOLIN SODIUM-DEXTROSE 2-4 GM/100ML-% IV SOLN
2.0000 g | INTRAVENOUS | Status: AC
  Administered 2023-12-20: 2 g via INTRAVENOUS
  Filled 2023-12-20: qty 100

## 2023-12-20 MED ORDER — ALUM & MAG HYDROXIDE-SIMETH 200-200-20 MG/5ML PO SUSP
30.0000 mL | ORAL | Status: DC | PRN
Start: 2023-12-20 — End: 2023-12-21

## 2023-12-20 MED ORDER — INSULIN ASPART 100 UNIT/ML IJ SOLN
0.0000 [IU] | Freq: Three times a day (TID) | INTRAMUSCULAR | Status: DC
  Administered 2023-12-20: 5 [IU] via SUBCUTANEOUS
  Administered 2023-12-21: 2 [IU] via SUBCUTANEOUS
  Administered 2023-12-21: 3 [IU] via SUBCUTANEOUS
  Filled 2023-12-20: qty 2
  Filled 2023-12-20: qty 5
  Filled 2023-12-20: qty 3

## 2023-12-20 MED ORDER — PHENOL 1.4 % MT LIQD: 1.0000 | OROMUCOSAL | Status: DC | PRN

## 2023-12-20 MED ORDER — METOCLOPRAMIDE HCL 5 MG/ML IJ SOLN
5.0000 mg | Freq: Three times a day (TID) | INTRAMUSCULAR | Status: DC | PRN
  Administered 2023-12-20: 10 mg via INTRAVENOUS
  Filled 2023-12-20: qty 2

## 2023-12-20 MED ORDER — PROPOFOL 1000 MG/100ML IV EMUL
INTRAVENOUS | Status: AC
  Filled 2023-12-20: qty 100

## 2023-12-20 MED ORDER — DEXAMETHASONE SOD PHOSPHATE PF 10 MG/ML IJ SOLN
INTRAMUSCULAR | Status: DC | PRN
  Administered 2023-12-20: 4 mg via INTRAVENOUS

## 2023-12-20 MED ORDER — BUPIVACAINE HCL (PF) 0.5 % IJ SOLN
INTRAMUSCULAR | Status: DC | PRN
  Administered 2023-12-20: 2.5 mL via INTRATHECAL

## 2023-12-20 MED ORDER — LACTATED RINGERS IV SOLN: INTRAVENOUS | Status: DC

## 2023-12-20 MED ORDER — SENNA 8.6 MG PO TABS
2.0000 | ORAL_TABLET | Freq: Every day | ORAL | Status: DC
  Administered 2023-12-20: 17.2 mg via ORAL
  Filled 2023-12-20: qty 2

## 2023-12-20 MED ORDER — METOCLOPRAMIDE HCL 5 MG PO TABS: 5.0000 mg | ORAL_TABLET | Freq: Three times a day (TID) | ORAL | Status: DC | PRN

## 2023-12-20 MED ORDER — ORAL CARE MOUTH RINSE: 15.0000 mL | Freq: Once | OROMUCOSAL | Status: AC

## 2023-12-20 MED ORDER — INSULIN ASPART 100 UNIT/ML IJ SOLN
0.0000 [IU] | Freq: Every day | INTRAMUSCULAR | Status: DC
  Administered 2023-12-20: 4 [IU] via SUBCUTANEOUS
  Filled 2023-12-20: qty 4

## 2023-12-20 MED ORDER — BISACODYL 10 MG RE SUPP: 10.0000 mg | Freq: Every day | RECTAL | Status: DC | PRN

## 2023-12-20 MED ORDER — OXYCODONE HCL 5 MG PO TABS: 5.0000 mg | ORAL_TABLET | Freq: Once | ORAL | Status: DC | PRN

## 2023-12-20 MED ORDER — PHENYLEPHRINE 80 MCG/ML (10ML) SYRINGE FOR IV PUSH (FOR BLOOD PRESSURE SUPPORT)
PREFILLED_SYRINGE | INTRAVENOUS | Status: DC | PRN
  Administered 2023-12-20: 80 ug via INTRAVENOUS
  Administered 2023-12-20: 160 ug via INTRAVENOUS
  Administered 2023-12-20: 80 ug via INTRAVENOUS

## 2023-12-20 MED ORDER — FENTANYL CITRATE (PF) 50 MCG/ML IJ SOSY: 25.0000 ug | PREFILLED_SYRINGE | INTRAMUSCULAR | Status: DC | PRN

## 2023-12-20 MED ORDER — SODIUM CHLORIDE (PF) 0.9 % IJ SOLN
INTRAMUSCULAR | Status: DC | PRN
  Administered 2023-12-20: 61 mL

## 2023-12-20 MED ORDER — GLIPIZIDE ER 5 MG PO TB24
20.0000 mg | ORAL_TABLET | Freq: Every day | ORAL | Status: DC
  Administered 2023-12-21: 20 mg via ORAL
  Filled 2023-12-20: qty 4

## 2023-12-20 MED ORDER — SODIUM CHLORIDE 0.9 % IV SOLN: INTRAVENOUS | Status: DC

## 2023-12-20 MED ORDER — ONDANSETRON HCL 4 MG/2ML IJ SOLN
4.0000 mg | Freq: Four times a day (QID) | INTRAMUSCULAR | Status: DC | PRN
  Administered 2023-12-20 – 2023-12-21 (×2): 4 mg via INTRAVENOUS
  Filled 2023-12-20: qty 2

## 2023-12-20 MED ORDER — DIPHENHYDRAMINE HCL 12.5 MG/5ML PO ELIX: 12.5000 mg | ORAL_SOLUTION | ORAL | Status: DC | PRN

## 2023-12-20 MED ORDER — HYDROMORPHONE HCL 2 MG PO TABS
2.0000 mg | ORAL_TABLET | ORAL | Status: DC | PRN
  Administered 2023-12-20 – 2023-12-21 (×3): 3 mg via ORAL
  Filled 2023-12-20 (×3): qty 2

## 2023-12-20 MED ORDER — DROPERIDOL 2.5 MG/ML IJ SOLN: 0.6250 mg | Freq: Once | INTRAMUSCULAR | Status: DC | PRN

## 2023-12-20 MED ORDER — AMLODIPINE BESYLATE 5 MG PO TABS
5.0000 mg | ORAL_TABLET | Freq: Every day | ORAL | Status: DC
  Administered 2023-12-21: 5 mg via ORAL
  Filled 2023-12-20: qty 1

## 2023-12-20 MED ORDER — POLYETHYLENE GLYCOL 3350 17 G PO PACK
17.0000 g | PACK | Freq: Two times a day (BID) | ORAL | Status: DC
  Administered 2023-12-20 – 2023-12-21 (×2): 17 g via ORAL
  Filled 2023-12-20 (×2): qty 1

## 2023-12-20 MED ORDER — DEXAMETHASONE SOD PHOSPHATE PF 10 MG/ML IJ SOLN
10.0000 mg | Freq: Once | INTRAMUSCULAR | Status: AC
  Administered 2023-12-21: 10 mg via INTRAVENOUS

## 2023-12-20 MED ORDER — INSULIN ASPART 100 UNIT/ML IJ SOLN
0.0000 [IU] | INTRAMUSCULAR | Status: DC | PRN
  Administered 2023-12-20: 4 [IU] via SUBCUTANEOUS
  Filled 2023-12-20: qty 1

## 2023-12-20 MED ORDER — ONDANSETRON HCL 4 MG/2ML IJ SOLN
INTRAMUSCULAR | Status: AC
  Filled 2023-12-20: qty 2

## 2023-12-20 MED ORDER — ONDANSETRON HCL 4 MG/2ML IJ SOLN
INTRAMUSCULAR | Status: DC | PRN
  Administered 2023-12-20: 4 mg via INTRAVENOUS

## 2023-12-20 MED ORDER — ACETAMINOPHEN 500 MG PO TABS
1000.0000 mg | ORAL_TABLET | Freq: Four times a day (QID) | ORAL | Status: DC
  Administered 2023-12-20 – 2023-12-21 (×4): 1000 mg via ORAL
  Filled 2023-12-20 (×4): qty 2

## 2023-12-20 MED ORDER — SODIUM CHLORIDE (PF) 0.9 % IJ SOLN
INTRAMUSCULAR | Status: AC
  Filled 2023-12-20: qty 50

## 2023-12-20 MED ORDER — CEFAZOLIN SODIUM-DEXTROSE 2-4 GM/100ML-% IV SOLN
2.0000 g | Freq: Four times a day (QID) | INTRAVENOUS | Status: AC
  Administered 2023-12-20 (×2): 2 g via INTRAVENOUS
  Filled 2023-12-20 (×2): qty 100

## 2023-12-20 MED ORDER — FENTANYL CITRATE (PF) 100 MCG/2ML IJ SOLN
INTRAMUSCULAR | Status: AC
  Filled 2023-12-20: qty 2

## 2023-12-20 MED ORDER — MENTHOL 3 MG MT LOZG: 1.0000 | LOZENGE | OROMUCOSAL | Status: DC | PRN

## 2023-12-20 MED ORDER — MIDAZOLAM HCL 2 MG/2ML IJ SOLN
INTRAMUSCULAR | Status: AC
  Filled 2023-12-20: qty 2

## 2023-12-20 MED ORDER — BUPIVACAINE HCL (PF) 0.25 % IJ SOLN
INTRAMUSCULAR | Status: DC | PRN
  Administered 2023-12-20: 20 mL via PERINEURAL

## 2023-12-20 SURGICAL SUPPLY — 46 items
ATTUNE MED ANAT PAT 35 KNEE (Knees) IMPLANT
BAG COUNTER SPONGE SURGICOUNT (BAG) IMPLANT
BAG ZIPLOCK 12X15 (MISCELLANEOUS) ×2 IMPLANT
BASEPLATE TIB CMT FB PCKT SZ3 (Knees) IMPLANT
BLADE SAW SGTL 11.0X1.19X90.0M (BLADE) IMPLANT
BLADE SAW SGTL 13.0X1.19X90.0M (BLADE) ×2 IMPLANT
BNDG ELASTIC 6INX 5YD STR LF (GAUZE/BANDAGES/DRESSINGS) ×2 IMPLANT
BOWL SMART MIX CTS (DISPOSABLE) ×2 IMPLANT
CEMENT HV SMART SET (Cement) ×4 IMPLANT
COMPONENT FEM CMT ATTN NRW 3RT (Joint) IMPLANT
COVER SURGICAL LIGHT HANDLE (MISCELLANEOUS) ×2 IMPLANT
CUFF TRNQT CYL 34X4.125X (TOURNIQUET CUFF) ×2 IMPLANT
DERMABOND ADVANCED .7 DNX12 (GAUZE/BANDAGES/DRESSINGS) ×2 IMPLANT
DRAPE U-SHAPE 47X51 STRL (DRAPES) ×2 IMPLANT
DRESSING AQUACEL AG SP 3.5X10 (GAUZE/BANDAGES/DRESSINGS) ×2 IMPLANT
DURAPREP 26ML APPLICATOR (WOUND CARE) ×4 IMPLANT
ELECT REM PT RETURN 15FT ADLT (MISCELLANEOUS) ×2 IMPLANT
GLOVE BIO SURGEON STRL SZ 6 (GLOVE) ×2 IMPLANT
GLOVE BIOGEL PI IND STRL 6.5 (GLOVE) ×2 IMPLANT
GLOVE BIOGEL PI IND STRL 7.5 (GLOVE) ×2 IMPLANT
GLOVE ORTHO TXT STRL SZ7.5 (GLOVE) ×4 IMPLANT
GOWN STRL REUS W/ TWL LRG LVL3 (GOWN DISPOSABLE) ×4 IMPLANT
HOLDER FOLEY CATH W/STRAP (MISCELLANEOUS) IMPLANT
INSERT TIB ATTUNE KNEE 3 10 RT (Insert) IMPLANT
KIT TURNOVER KIT A (KITS) ×2 IMPLANT
MANIFOLD NEPTUNE II (INSTRUMENTS) ×2 IMPLANT
NDL SAFETY ECLIPSE 18X1.5 (NEEDLE) IMPLANT
NS IRRIG 1000ML POUR BTL (IV SOLUTION) ×2 IMPLANT
PACK TOTAL KNEE CUSTOM (KITS) ×2 IMPLANT
PENCIL SMOKE EVACUATOR (MISCELLANEOUS) ×2 IMPLANT
PIN FIX SIGMA LCS THRD HI (PIN) IMPLANT
PROTECTOR NERVE ULNAR (MISCELLANEOUS) ×2 IMPLANT
SET HNDPC FAN SPRY TIP SCT (DISPOSABLE) ×2 IMPLANT
SET PAD KNEE POSITIONER (MISCELLANEOUS) ×2 IMPLANT
SPIKE FLUID TRANSFER (MISCELLANEOUS) ×4 IMPLANT
SUT MNCRL AB 4-0 PS2 18 (SUTURE) ×2 IMPLANT
SUT STRATAFIX PDS+ 0 24IN (SUTURE) ×2 IMPLANT
SUT VIC AB 1 CT1 36 (SUTURE) ×2 IMPLANT
SUT VIC AB 2-0 CT1 TAPERPNT 27 (SUTURE) ×4 IMPLANT
SYR 3ML LL SCALE MARK (SYRINGE) ×2 IMPLANT
TOWEL GREEN STERILE FF (TOWEL DISPOSABLE) ×2 IMPLANT
TRAY FOLEY MTR SLVR 14FR STAT (SET/KITS/TRAYS/PACK) IMPLANT
TRAY FOLEY MTR SLVR 16FR STAT (SET/KITS/TRAYS/PACK) ×2 IMPLANT
TUBE SUCTION HIGH CAP CLEAR NV (SUCTIONS) ×2 IMPLANT
WATER STERILE IRR 1000ML POUR (IV SOLUTION) ×4 IMPLANT
WRAP KNEE MAXI GEL POST OP (GAUZE/BANDAGES/DRESSINGS) ×2 IMPLANT

## 2023-12-20 NOTE — Discharge Instructions (Signed)

## 2023-12-20 NOTE — Plan of Care (Signed)

## 2023-12-20 NOTE — Evaluation (Signed)
 Physical Therapy Evaluation Patient Details Name: [name] [name] MRN: [id number] DOB: 1955/05/02 Today's Date: 12/20/2023  History of Present Illness  68 yo female admitted with R TKA 12/20/23. Hx of vertigo, obesity, shingles, osteoporosis, DM, back pain, RCR  Clinical Impression  On eval POD 0, pt require Min A for mobility. She ambulated ~25 feet with a RW. Moderate pain reported during session. Will plan to follow and progress activity as tolerated. OPPT f/u after discharge.         If plan is discharge home, recommend the following: A little help with walking and/or transfers;A little help with bathing/dressing/bathroom;Assistance with cooking/housework;Assist for transportation;Help with stairs or ramp for entrance   Can travel by private vehicle        Equipment Recommendations None recommended by PT  Recommendations for Other Services       Functional Status Assessment Patient has had a recent decline in their functional status and demonstrates the ability to make significant improvements in function in a reasonable and predictable amount of time.     Precautions / Restrictions Precautions Precautions: Fall Restrictions Weight Bearing Restrictions Per Provider Order: No RLE Weight Bearing Per Provider Order: Weight bearing as tolerated      Mobility  Bed Mobility Overal bed mobility: Needs Assistance Bed Mobility: Supine to Sit     Supine to sit: Min assist, HOB elevated     General bed mobility comments: Assist for R LE. Increased time. Cues provided as necessary.    Transfers Overall transfer level: Needs assistance Equipment used: Rolling walker (2 wheels) Transfers: Sit to/from Stand Sit to Stand: Min assist, From elevated surface           General transfer comment: Cues for safety, technique hand/LE placement. Assist to rise, steady, control descent.    Ambulation/Gait Ambulation/Gait assistance: Contact guard assist Gait Distance (Feet): 25  Feet Assistive device: Rolling walker (2 wheels) Gait Pattern/deviations: Step-to pattern       General Gait Details: Cues for safety, sequencing, proper use of RW. Slow gait speed. No overt LOB. Pt denied dizziness. Tolerated distance well.  Stairs            Wheelchair Mobility     Tilt Bed    Modified Rankin (Stroke Patients Only)       Balance Overall balance assessment: Needs assistance         Standing balance support: Bilateral upper extremity supported, During functional activity, Reliant on assistive device for balance Standing balance-Leahy Scale: Poor                               Pertinent Vitals/Pain Pain Assessment Pain Assessment: 0-10 Pain Score: 8  Pain Location: R medial knee Pain Descriptors / Indicators: Aching Pain Intervention(s): Limited activity within patient's tolerance, Monitored during session, Repositioned    Home Living Family/patient expects to be discharged to:: Private residence Living Arrangements: Alone Available Help at Discharge: Friend(s);Available PRN/intermittently Type of Home: House Home Access: Stairs to enter Entrance Stairs-Rails: None Entrance Stairs-Number of Steps: 1+1   Home Layout: One level Home Equipment: Agricultural Consultant (2 wheels);Cane - single point;BSC/3in1      Prior Function Prior Level of Function : Independent/Modified Independent                     Extremity/Trunk Assessment   Upper Extremity Assessment Upper Extremity Assessment: Overall WFL for tasks assessed    Lower Extremity Assessment  Lower Extremity Assessment: Generalized weakness    Cervical / Trunk Assessment Cervical / Trunk Assessment: Normal  Communication   Communication Communication: No apparent difficulties    Cognition Arousal: Alert Behavior During Therapy: WFL for tasks assessed/performed   PT - Cognitive impairments: No apparent impairments                         Following  commands: Intact       Cueing Cueing Techniques: Verbal cues     General Comments      Exercises     Assessment/Plan    PT Assessment Patient needs continued PT services  PT Problem List Decreased strength;Decreased range of motion;Decreased activity tolerance;Decreased balance;Decreased mobility;Decreased knowledge of use of DME;Pain       PT Treatment Interventions DME instruction;Gait training;Stair training;Therapeutic activities;Therapeutic exercise;Functional mobility training;Patient/family education;Balance training    PT Goals (Current goals can be found in the Care Plan section)  Acute Rehab PT Goals Patient Stated Goal: regain plof/independence PT Goal Formulation: With patient Time For Goal Achievement: 01/03/24 Potential to Achieve Goals: Good    Frequency 7X/week     Co-evaluation               AM-PAC PT 6 Clicks Mobility  Outcome Measure Help needed turning from your back to your side while in a flat bed without using bedrails?: A Little Help needed moving from lying on your back to sitting on the side of a flat bed without using bedrails?: A Little Help needed moving to and from a bed to a chair (including a wheelchair)?: A Little Help needed standing up from a chair using your arms (e.g., wheelchair or bedside chair)?: A Little Help needed to walk in hospital room?: A Little Help needed climbing 3-5 steps with a railing? : A Little 6 Click Score: 18    End of Session Equipment Utilized During Treatment: Gait belt Activity Tolerance: Patient tolerated treatment well Patient left: in bed;with call bell/phone within reach;with bed alarm set;with family/visitor present   PT Visit Diagnosis: Other abnormalities of gait and mobility (R26.89);Pain Pain - Right/Left: Right Pain - part of body: Knee    Time: 8381-8358 PT Time Calculation (min) (ACUTE ONLY): 23 min   Charges:   PT Evaluation $PT Eval Low Complexity: 1 Low PT Treatments $Gait  Training: 8-22 mins PT General Charges $$ ACUTE PT VISIT: 1 Visit           Dannial SQUIBB, PT Acute Rehabilitation  Office: [phone]

## 2023-12-20 NOTE — Anesthesia Postprocedure Evaluation (Signed)
 Anesthesia Post Note  Patient: [name] [name]  Procedure(s) Performed: ARTHROPLASTY, KNEE, TOTAL (Right: Knee)     Patient location during evaluation: PACU Anesthesia Type: Spinal Level of consciousness: oriented and awake and alert Pain management: pain level controlled Vital Signs Assessment: post-procedure vital signs reviewed and stable Respiratory status: spontaneous breathing, respiratory function stable and patient connected to nasal cannula oxygen Cardiovascular status: blood pressure returned to baseline and stable Postop Assessment: no headache, no backache, no apparent nausea or vomiting and spinal receding Anesthetic complications: no   No notable events documented.  Last Vitals:  Vitals:   12/20/23 0554 12/20/23 0851  BP: (!) 150/70   Pulse: 72   Resp: 16   Temp: 36.9 C (!) (P) 36.2 C  SpO2: 98%     Last Pain:  Vitals:   12/20/23 0554  TempSrc: Oral                 [name]

## 2023-12-20 NOTE — Op Note (Signed)
 NAME:  [name] [name]                      MEDICAL RECORD NO.:  [id number]                             FACILITY:  [facility]      PHYSICIAN:  Donnice BIRCH. Ernie, M.D.  DATE OF BIRTH:  04-12-1955      DATE OF PROCEDURE:  12/20/2023                                     OPERATIVE REPORT         PREOPERATIVE DIAGNOSIS:  Right knee osteoarthritis.      POSTOPERATIVE DIAGNOSIS:  Right knee osteoarthritis.      FINDINGS:  The patient was noted to have complete loss of cartilage and   bone-on-bone arthritis with associated osteophytes in the medial and patellofemoral compartments of   the knee with a significant synovitis and associated effusion.  The patient had failed months of conservative treatment including medications, injection therapy, activity modification.     PROCEDURE:  Right total knee replacement.      COMPONENTS USED:  DePuy Attune FB CR MS knee   system, a size 3N femur, 3 tibia, size 10 mm CR MS AOX insert, and 35 anatomic patellar   button.      SURGEON:  Donnice BIRCH. Ernie, M.D.      ASSISTANT:  Rosina Calin, PA-C.      ANESTHESIA:  General and Spinal.      SPECIMENS:  None.      COMPLICATION:  None.      DRAINS:  None.  EBL: <200 cc      TOURNIQUET TIME:  No tourniquet used     The patient was stable to the recovery room.      INDICATION FOR PROCEDURE:  [name] [name] is a 68 y.o. female patient of   mine.  The patient had been seen, evaluated, and treated for months conservatively in the   office with medication, activity modification, and injections.  The patient had   radiographic changes of bone-on-bone arthritis with endplate sclerosis and osteophytes noted.  Based on the radiographic changes and failed conservative measures, the patient   decided to proceed with definitive treatment, total knee replacement.  Risks of infection, DVT, component failure, need for revision surgery, neurovascular injury were reviewed in the office setting.  The postop course was  reviewed stressing the efforts to maximize post-operative satisfaction and function.  Consent was obtained for benefit of pain   relief.      PROCEDURE IN DETAIL:  The patient was brought to the operative theater.   Once adequate anesthesia, preoperative antibiotics, 2 gm of Ancef ,1 gm of Tranexamic Acid, and 10 mg of Decadron administered, the patient was positioned supine with a right thigh tourniquet placed.  The  right lower extremity was prepped and draped in sterile fashion.  A time-   out was performed identifying the patient, planned procedure, and the appropriate extremity.      The right lower extremity was placed in the [facility] leg holder.  A midline incision was   made followed by median parapatellar arthrotomy.  Following initial   exposure, attention was first directed to the patella.  Precut   measurement was noted to be  22 mm.  I resected down to 13 mm and used a   35 anatomic patellar button to restore patellar height as well as cover the cut surface.      The lug holes were drilled and a metal shim was placed to protect the   patella from retractors and saw blade during the procedure.      At this point, attention was now directed to the femur.  The femoral   canal was opened with a drill, irrigated to try to prevent fat emboli.  An   intramedullary rod was passed at 3 degrees valgus, 8 mm of bone was   resected off the distal femur.  Following this resection, the tibia was   subluxated anteriorly.  Using the extramedullary guide, 2 mm of bone was resected off   the proximal medial tibia.  We confirmed the gap would be   stable medially and laterally with a size 6 spacer block as well as confirmed that the tibial cut was perpendicular in the coronal plane, checking with an alignment rod.      Once this was done, I sized the femur to be a size 3 in the anterior-   posterior dimension, chose a narrow component based on medial and   lateral dimension.  The size 3 rotation  block was then pinned in   position anterior referenced using the C-clamp to set rotation.  The   anterior, posterior, and  chamfer cuts were made without difficulty nor   notching making certain that I was along the anterior cortex to help   with flexion gap stability.      The final box cut was made off the lateral aspect of distal femur.      At this point, the tibia was sized to be a size 3.  The size 3 tray was   then pinned in position through the medial third of the tubercle,   drilled, and keel punched.  Trial reduction was now carried with a 3 femur,  3 tibia, a size 10 mm CR insert, and the 35 anatomic patella botton.  The knee was brought to full extension with good flexion stability with the patella   tracking through the trochlea without application of pressure.  Given   all these findings the trial components removed.  Final components were   opened and cement was mixed.  The knee was irrigated with normal saline solution and pulse lavage.  The synovial lining was   then injected with 30 cc of 0.25% Marcaine  with epinephrine, 1 cc of Toradol  and 30 cc of NS for a total of 61 cc.     Final implants were then cemented onto cleaned and dried cut surfaces of bone with the knee brought to extension with a size 10 mm CR trial insert.      Once the cement had fully cured, excess cement was removed   throughout the knee.  I confirmed that I was satisfied with the range of   motion and stability, and the final size 10 mm CR MS AOX insert was chosen.  It was   placed into the knee.      At this point in the case no significant   hemostasis was required.  The extensor mechanism was then reapproximated using #1 Vicryl and #1 Stratafix sutures with the knee   in flexion.  The   remaining wound was closed with 2-0 Vicryl and running 4-0 Monocryl.   The knee was  cleaned, dried, dressed sterilely using Dermabond and   Aquacel dressing.  The patient was then   brought to recovery room in  stable condition, tolerating the procedure   well.   Please note that Physician Assistant, Rosina Calin, PA-C was present for the entirety of the case, and was utilized for pre-operative positioning, peri-operative retractor management, general facilitation of the procedure and for primary wound closure at the end of the case.              Donnice CORDOBA Ernie, M.D.    12/20/2023 8:32 AM

## 2023-12-20 NOTE — Transfer of Care (Signed)
 Immediate Anesthesia Transfer of Care Note  Patient: [name] [name]  Procedure(s) Performed: ARTHROPLASTY, KNEE, TOTAL (Right: Knee)  Patient Location: PACU  Anesthesia Type:MAC and Spinal  Level of Consciousness: oriented and drowsy  Airway & Oxygen Therapy: Patient Spontanous Breathing and Patient connected to face mask oxygen  Post-op Assessment: Report given to RN and Post -op Vital signs reviewed and stable  Post vital signs: Reviewed and stable  Last Vitals:  Vitals Value Taken Time  BP 104/53 12/20/23 08:51  Temp    Pulse 70 12/20/23 08:57  Resp 14 12/20/23 08:57  SpO2 99 % 12/20/23 08:57  Vitals shown include unfiled device data.  Last Pain:  Vitals:   12/20/23 0554  TempSrc: Oral         Complications: No notable events documented.

## 2023-12-20 NOTE — Interval H&P Note (Signed)
 History and Physical Interval Note:  12/20/2023 7:04 AM  [name] [name]  has presented today for surgery, with the diagnosis of Right knee osteoarthritis.  The various methods of treatment have been discussed with the patient and family. After consideration of risks, benefits and other options for treatment, the patient has consented to  Procedure(s): ARTHROPLASTY, KNEE, TOTAL (Right) as a surgical intervention.  The patient's history has been reviewed, patient examined, no change in status, stable for surgery.  I have reviewed the patient's chart and labs.  Questions were answered to the patient's satisfaction.     [name]

## 2023-12-20 NOTE — Anesthesia Procedure Notes (Signed)
 Anesthesia Regional Block: Adductor canal block   Pre-Anesthetic Checklist: , timeout performed,  Correct Patient, Correct Site, Correct Laterality,  Correct Procedure, Correct Position, site marked,  Risks and benefits discussed,  Surgical consent,  Pre-op evaluation,  At surgeon's request and post-op pain management  Laterality: Lower and Right  Prep: chloraprep       Needles:  Injection technique: Single-shot  Needle Type: Echogenic Needle     Needle Length: 9cm  Needle Gauge: 21     Additional Needles:   Procedures:,,,, ultrasound used (permanent image in chart),,    Narrative:  Start time: 12/20/2023 7:06 AM End time: 12/20/2023 7:08 AM Injection made incrementally with aspirations every 5 mL.  Performed by: Personally  Anesthesiologist: Boone Fess, MD  Additional Notes: Patient's chart reviewed and they were deemed appropriate candidate for procedure, per surgeon's request. Patient educated about risks, benefits, and alternatives of the block including but not limited to: temporary or permanent nerve damage, bleeding, infection, damage to surround tissues, block failure, local anesthetic toxicity. Patient expressed understanding. A formal time-out was conducted consistent with institution rules.  Monitors were applied, and minimal sedation used (see nursing record). The site was prepped with skin prep and allowed to dry, and sterile gloves were used. A high frequency linear ultrasound probe with probe cover was utilized throughout. Femoral artery visualized at mid-thigh level, local anesthetic injected anterolateral to it, and echogenic block needle trajectory was monitored throughout. Hydrodissection of saphenous nerve visualized and appeared anatomically normal. Aspiration performed every 5ml. Blood vessels were avoided. All injections were performed without resistance and free of blood and paresthesias. The patient tolerated the procedure well.  Injectate: 20ml 0.25%  bupivacaine 

## 2023-12-20 NOTE — Plan of Care (Signed)
  Problem: Activity: Goal: Risk for activity intolerance will decrease Outcome: Progressing   Problem: Nutrition: Goal: Adequate nutrition will be maintained Outcome: Progressing   Problem: Coping: Goal: Level of anxiety will decrease Outcome: Progressing   Problem: Pain Managment: Goal: General experience of comfort will improve and/or be controlled Outcome: Progressing   Problem: Safety: Goal: Ability to remain free from injury will improve Outcome: Progressing

## 2023-12-20 NOTE — Anesthesia Preprocedure Evaluation (Signed)
 Anesthesia Evaluation  Patient identified by MRN, date of birth, ID band Patient awake    Reviewed: Allergy & Precautions, NPO status , Patient's Chart, lab work & pertinent test results  History of Anesthesia Complications (+) PONV and history of anesthetic complications  Airway Mallampati: II  TM Distance: >3 FB Neck ROM: Full    Dental no notable dental hx. (+) Teeth Intact   Pulmonary neg pulmonary ROS, neg sleep apnea, neg COPD, Patient abstained from smoking.Not current smoker   Pulmonary exam normal breath sounds clear to auscultation       Cardiovascular Exercise Tolerance: Good METShypertension, Pt. on medications (-) CAD and (-) Past MI (-) dysrhythmias  Rhythm:Regular Rate:Normal - Systolic murmurs    Neuro/Psych negative neurological ROS  negative psych ROS   GI/Hepatic ,neg GERD  ,,(+)     (-) substance abuse    Endo/Other  diabetes, Well Controlled, Oral Hypoglycemic AgentsHypothyroidism    Renal/GU negative Renal ROS     Musculoskeletal  (+) Arthritis ,    Abdominal   Peds  Hematology Denies blood thinner use or bleeding disorders.    Anesthesia Other Findings Denies blood thinner use or bleeding diatheses. Recent labs reviewed. Past Medical History: 2000: Allergy No date: Arthritis 06/13/2015: Back pain No date: Diabetes mellitus     Comment:  started Metformin  12/2012 2012: History of shingles     Comment:  back No date: Hyperlipidemia No date: Hypertension 07/29/2010: Hypothyroidism     Comment:  S/p FNA showing non-neoplastic goiter 7/12 w/ Dr Jesus  08/06/2016: Obesity 08/06/2016: Osteoporosis No date: PONV (postoperative nausea and vomiting) 08/06/2016: Vitamin D  deficiency   Reproductive/Obstetrics                              Anesthesia Physical Anesthesia Plan  ASA: 2  Anesthesia Plan: Spinal   Post-op Pain Management:    Induction:  Intravenous  PONV Risk Score and Plan: 3 and Ondansetron , Dexamethasone, Propofol  infusion, TIVA and Midazolam   Airway Management Planned: Natural Airway  Additional Equipment: None  Intra-op Plan:   Post-operative Plan:   Informed Consent: I have reviewed the patients History and Physical, chart, labs and discussed the procedure including the risks, benefits and alternatives for the proposed anesthesia with the patient or authorized representative who has indicated his/her understanding and acceptance.       Plan Discussed with: CRNA and Surgeon  Anesthesia Plan Comments: (Discussed R/B/A of neuraxial anesthesia technique with patient: - rare risks of spinal/epidural hematoma, nerve damage, infection - Risk of PDPH - Risk of nausea and vomiting - Risk of conversion to general anesthesia and its associated risks, including sore throat, damage to lips/eyes/teeth/oropharynx, and rare risks such as cardiac and respiratory events. - Risk of allergic reactions  Discussed r/b/a of adductor canal nerve block, including:  - bleeding, infection, nerve damage - poor or non functioning block. - reactions and toxicity to local anesthetic Patient understands.   Discussed the role of CRNA in patient's perioperative care.  Patient voiced understanding.)        Anesthesia Quick Evaluation

## 2023-12-20 NOTE — Anesthesia Procedure Notes (Signed)
 Spinal  Patient location during procedure: OR Start time: 12/20/2023 7:25 AM End time: 12/20/2023 7:27 AM Reason for block: surgical anesthesia Staffing Performed: anesthesiologist  Anesthesiologist: Boone Fess, MD Performed by: Boone Fess, MD Authorized by: Boone Fess, MD   Preanesthetic Checklist Completed: patient identified, IV checked, site marked, risks and benefits discussed, surgical consent, monitors and equipment checked, pre-op evaluation and timeout performed Spinal Block Patient position: sitting Prep: ChloraPrep and site prepped and draped Patient monitoring: heart rate, continuous pulse ox, blood pressure and cardiac monitor Approach: midline Location: L3-4 Injection technique: single-shot Needle Needle type: Whitacre and Introducer  Needle gauge: 24 G Needle length: 9 cm Assessment Sensory level: T10 Events: CSF return Additional Notes Meticulous sterile technique used throughout (CHG prep, sterile gloves, sterile drape). Negative paresthesia. Negative blood return. Positive free-flowing CSF. Expiration date of kit checked and confirmed. Patient tolerated procedure well, without complications.

## 2023-12-21 ENCOUNTER — Other Ambulatory Visit (HOSPITAL_COMMUNITY): Payer: Self-pay

## 2023-12-21 ENCOUNTER — Encounter (HOSPITAL_COMMUNITY): Payer: Self-pay | Admitting: Orthopedic Surgery

## 2023-12-21 ENCOUNTER — Other Ambulatory Visit: Payer: Self-pay

## 2023-12-21 DIAGNOSIS — E039 Hypothyroidism, unspecified: Secondary | ICD-10-CM | POA: Diagnosis not present

## 2023-12-21 DIAGNOSIS — M1711 Unilateral primary osteoarthritis, right knee: Secondary | ICD-10-CM | POA: Diagnosis not present

## 2023-12-21 DIAGNOSIS — Z79899 Other long term (current) drug therapy: Secondary | ICD-10-CM | POA: Diagnosis not present

## 2023-12-21 DIAGNOSIS — I10 Essential (primary) hypertension: Secondary | ICD-10-CM | POA: Diagnosis not present

## 2023-12-21 DIAGNOSIS — E119 Type 2 diabetes mellitus without complications: Secondary | ICD-10-CM | POA: Diagnosis not present

## 2023-12-21 DIAGNOSIS — M25561 Pain in right knee: Secondary | ICD-10-CM | POA: Diagnosis not present

## 2023-12-21 DIAGNOSIS — Z7982 Long term (current) use of aspirin: Secondary | ICD-10-CM | POA: Diagnosis not present

## 2023-12-21 LAB — BASIC METABOLIC PANEL WITH GFR
Anion gap: 10 (ref 5–15)
BUN: 14 mg/dL (ref 8–23)
CO2: 24 mmol/L (ref 22–32)
Calcium: 9.5 mg/dL (ref 8.9–10.3)
Chloride: 103 mmol/L (ref 98–111)
Creatinine, Ser: 0.86 mg/dL (ref 0.44–1.00)
GFR, Estimated: >60 mL/min (ref >60)
Glucose, Bld: 165 mg/dL — ABNORMAL HIGH (ref 70–99)
Potassium: 4.7 mmol/L (ref 3.5–5.1)
Sodium: 138 mmol/L (ref 135–145)

## 2023-12-21 LAB — CBC
HCT: 37.2 % (ref 36.0–46.0)
Hemoglobin: 12.2 g/dL (ref 12.0–15.0)
MCH: 29.7 pg (ref 26.0–34.0)
MCHC: 32.8 g/dL (ref 30.0–36.0)
MCV: 90.5 fL (ref 80.0–100.0)
Platelets: 211 K/uL (ref 150–400)
RBC: 4.11 MIL/uL (ref 3.87–5.11)
RDW: 12.3 % (ref 11.5–15.5)
WBC: 16.2 K/uL — ABNORMAL HIGH (ref 4.0–10.5)
nRBC: 0 % (ref 0.0–0.2)

## 2023-12-21 LAB — GLUCOSE, CAPILLARY
Glucose-Capillary: 145 mg/dL — ABNORMAL HIGH (ref 70–99)
Glucose-Capillary: 273 mg/dL — ABNORMAL HIGH (ref 70–99)

## 2023-12-21 MED ORDER — METHOCARBAMOL 500 MG PO TABS
500.0000 mg | ORAL_TABLET | Freq: Four times a day (QID) | ORAL | 0 refills | Status: AC | PRN
  Filled 2023-12-21: qty 40, 10d supply, fill #0

## 2023-12-21 MED ORDER — POLYETHYLENE GLYCOL 3350 17 GM/SCOOP PO POWD
17.0000 g | Freq: Two times a day (BID) | ORAL | 0 refills | Status: AC
  Filled 2023-12-21: qty 238, 7d supply, fill #0

## 2023-12-21 MED ORDER — SENNA 8.6 MG PO TABS
2.0000 | ORAL_TABLET | Freq: Every day | ORAL | 0 refills | Status: AC
  Filled 2023-12-21: qty 28, 14d supply, fill #0

## 2023-12-21 MED ORDER — ASPIRIN 81 MG PO CHEW
81.0000 mg | CHEWABLE_TABLET | Freq: Two times a day (BID) | ORAL | 0 refills | Status: AC
  Filled 2023-12-21: qty 56, 28d supply, fill #0

## 2023-12-21 MED ORDER — ONDANSETRON HCL 4 MG PO TABS
4.0000 mg | ORAL_TABLET | Freq: Four times a day (QID) | ORAL | 0 refills | Status: AC | PRN
  Filled 2023-12-21: qty 20, 5d supply, fill #0

## 2023-12-21 MED ORDER — MUPIROCIN 2 % EX OINT
1.0000 | TOPICAL_OINTMENT | Freq: Two times a day (BID) | CUTANEOUS | 0 refills | Status: AC
  Filled 2023-12-21: qty 22, 11d supply, fill #0

## 2023-12-21 MED ORDER — HYDROMORPHONE HCL 2 MG PO TABS
2.0000 mg | ORAL_TABLET | ORAL | 0 refills | Status: AC | PRN
  Filled 2023-12-21: qty 42, 7d supply, fill #0

## 2023-12-21 MED ORDER — CELECOXIB 200 MG PO CAPS
200.0000 mg | ORAL_CAPSULE | Freq: Two times a day (BID) | ORAL | 0 refills | Status: AC
  Filled 2023-12-21 (×2): qty 60, 30d supply, fill #0

## 2023-12-21 NOTE — Plan of Care (Signed)
  Problem: Pain Managment: Goal: General experience of comfort will improve and/or be controlled Outcome: Progressing   Problem: Safety: Goal: Ability to remain free from injury will improve Outcome: Progressing

## 2023-12-21 NOTE — TOC Transition Note (Signed)
 Transition of Care [facility]) - Discharge Note   Patient Details  Name: [name] [name] MRN: [id number] Date of Birth: [birth year]  [facility] CM/SW Contact:  NORMAN ASPEN, LCSW Phone Number: 12/21/2023, 10:02 AM   Clinical Narrative:     Met with pt who confirms she has needed DME in the home.  OPPT already arranged with [facility].  No further TOC needs.  Final next level of care: OP Rehab Barriers to Discharge: No Barriers Identified   Patient Goals and CMS Choice Patient states their goals for this hospitalization and ongoing recovery are:: return home          Discharge Placement                       Discharge Plan and Services Additional resources added to the After Visit Summary for                  DME Arranged: N/A DME Agency: NA                  Social Drivers of Health (SDOH) Interventions SDOH Screenings   Food Insecurity: No Food Insecurity (12/20/2023)  Housing: Low Risk  (12/20/2023)  Transportation Needs: No Transportation Needs (12/20/2023)  Utilities: Not At Risk (12/20/2023)  Alcohol Screen: Low Risk  (10/25/2023)  Depression (PHQ2-9): Low Risk  (10/25/2023)  Financial Resource Strain: Low Risk  (10/25/2023)  Physical Activity: Inactive (10/25/2023)  Social Connections: Moderately Isolated (12/20/2023)  Stress: Stress Concern Present (10/25/2023)  Tobacco Use: Low Risk  (12/20/2023)  Health Literacy: Adequate Health Literacy (10/25/2023)     Readmission Risk Interventions     No data to display

## 2023-12-21 NOTE — Progress Notes (Signed)
Discharge package printed and instructions given to pt. Pt verbalizes understanding. 

## 2023-12-21 NOTE — Progress Notes (Signed)
   Subjective: 1 Day Post-Op Procedure(s) (LRB): ARTHROPLASTY, KNEE, TOTAL (Right) Patient reports pain as mild.   Patient seen in rounds with Dr. Ernie. Patient is well, and has had no acute complaints or problems. No acute events overnight. Foley catheter removed. Patient ambulated 25 feet with PT. She did have some nausea yesterday.  We will start therapy today.   Objective: Vital signs in last 24 hours: Temp:  [97.1 F (36.2 C)-98.4 F (36.9 C)] 97.9 F (36.6 C) (11/05 0547) Pulse Rate:  [58-77] 58 (11/05 0547) Resp:  [10-24] 16 (11/05 0547) BP: (96-149)/(54-83) 135/74 (11/05 0547) SpO2:  [98 %-100 %] 100 % (11/05 0547)  Intake/Output from previous day:  Intake/Output Summary (Last 24 hours) at 12/21/2023 0830 Last data filed at 12/21/2023 0550 Gross per 24 hour  Intake 3036.93 ml  Output 1250 ml  Net 1786.93 ml     Intake/Output this shift: No intake/output data recorded.  Labs: Recent Labs    12/21/23 0331  HGB 12.2   Recent Labs    12/21/23 0331  WBC 16.2*  RBC 4.11  HCT 37.2  PLT 211   Recent Labs    12/21/23 0331  NA 138  K 4.7  CL 103  CO2 24  BUN 14  CREATININE 0.86  GLUCOSE 165*  CALCIUM  9.5   No results for input(s): LABPT, INR in the last 72 hours.  Exam: General - Patient is Alert and Oriented Extremity - Neurologically intact Sensation intact distally Intact pulses distally Dorsiflexion/Plantar flexion intact Dressing - dressing C/D/I Motor Function - intact, moving foot and toes well on exam.   Past Medical History:  Diagnosis Date   Allergy 2000   Arthritis    Back pain 06/13/2015   Diabetes mellitus    started Metformin  12/2012   History of shingles 2012   back   Hyperlipidemia    Hypertension    Hypothyroidism 07/29/2010   S/p FNA showing non-neoplastic goiter 7/12 w/ Dr Jesus    Obesity 08/06/2016   Osteoporosis 08/06/2016   PONV (postoperative nausea and vomiting)    Vitamin D  deficiency 08/06/2016     Assessment/Plan: 1 Day Post-Op Procedure(s) (LRB): ARTHROPLASTY, KNEE, TOTAL (Right) Principal Problem:   S/P total knee arthroplasty, right  Estimated body mass index is 32.19 kg/m as calculated from the following:   Height as of this encounter: 5' 5.6 (1.666 m).   Weight as of this encounter: 89.4 kg. Advance diet Up with therapy D/C IV fluids   Patient's anticipated LOS is less than 2 midnights, meeting these requirements: - Younger than 32 - Lives within 1 hour of care - Has a competent adult at home to recover with post-op recover - NO history of  - Chronic pain requiring opiods  - Diabetes  - Coronary Artery Disease  - Heart failure  - Heart attack  - Stroke  - DVT/VTE  - Cardiac arrhythmia  - Respiratory Failure/COPD  - Renal failure  - Anemia  - Advanced Liver disease     DVT Prophylaxis - Aspirin Weight bearing as tolerated.  Hgb stable at 12.2 this AM.  Plan is to go Home after hospital stay. Plan for discharge today following 1-2 sessions of PT as long as they are meeting their goals. Patient is scheduled for OPPT. Follow up in the office in 2 weeks.   Rosina Calin, PA-C Orthopedic Surgery [phone] 12/21/2023, 8:30 AM

## 2023-12-21 NOTE — Care Management Obs Status (Signed)
 MEDICARE OBSERVATION STATUS NOTIFICATION   Patient Details  Name: [name] [name] MRN: [id number] Date of Birth: 1955-08-20   Medicare Observation Status Notification Given:  Chaney NORMAN ASPEN, LCSW 12/21/2023, 1:43 PM

## 2023-12-21 NOTE — Progress Notes (Signed)
 Physical Therapy Treatment Patient Details Name: [name] [name] MRN: [id number] DOB: 03-04-1955 Today's Date: 12/21/2023   History of Present Illness 68 yo female admitted with R TKA 12/20/23. Hx of vertigo, obesity, shingles, osteoporosis, DM, back pain, RCR    PT Comments  Pt agreeable to therapy session. She reports nausea on today. She also c/o feeling hot while ambulating. BP 153/71 once seated back in room. Pan rated 8/10 with activity. Will return for 2nd session for stair negotiation.    If plan is discharge home, recommend the following: A little help with walking and/or transfers;A little help with bathing/dressing/bathroom;Assistance with cooking/housework;Assist for transportation;Help with stairs or ramp for entrance   Can travel by private vehicle        Equipment Recommendations  None recommended by PT    Recommendations for Other Services       Precautions / Restrictions Precautions Precautions: Fall Restrictions Weight Bearing Restrictions Per Provider Order: No RLE Weight Bearing Per Provider Order: Weight bearing as tolerated     Mobility  Bed Mobility Overal bed mobility: Needs Assistance Bed Mobility: Supine to Sit     Supine to sit: Min assist     General bed mobility comments: Assist for R LE-pt used UEs . Increased time. Cues provided as necessary.    Transfers Overall transfer level: Needs assistance Equipment used: Rolling walker (2 wheels) Transfers: Sit to/from Stand Sit to Stand: Min assist           General transfer comment: Cues for safety, technique hand/LE placement. Assist to rise, steady, control descent.    Ambulation/Gait Ambulation/Gait assistance: Min assist Gait Distance (Feet): 30 Feet (30'x1; 15'x1) Assistive device: Rolling walker (2 wheels) Gait Pattern/deviations: Step-to pattern       General Gait Details: Cues for safety, sequencing, proper use of RW. Slow gait speed. No overt LOB. Pt denied dizziness.  Tolerated distance well.   Stairs             Wheelchair Mobility     Tilt Bed    Modified Rankin (Stroke Patients Only)       Balance Overall balance assessment: Needs assistance         Standing balance support: Bilateral upper extremity supported, During functional activity, Reliant on assistive device for balance Standing balance-Leahy Scale: Poor                              Communication Communication Communication: No apparent difficulties  Cognition Arousal: Alert Behavior During Therapy: WFL for tasks assessed/performed   PT - Cognitive impairments: No apparent impairments                         Following commands: Intact      Cueing Cueing Techniques: Verbal cues  Exercises Total Joint Exercises Ankle Circles/Pumps: AROM, Both, 10 reps Quad Sets: AROM, Both, 10 reps Straight Leg Raises: AAROM, Right, 5 reps Long Arc Quad: AROM, Right, 5 reps Knee Flexion: AROM, Right, 5 reps, Seated Goniometric ROM: ~10-70 degrees    General Comments        Pertinent Vitals/Pain Pain Assessment Pain Assessment: 0-10 Pain Score: 8  Pain Location: R knee/thigh Pain Descriptors / Indicators: Aching Pain Intervention(s): Monitored during session, Ice applied, Repositioned (heat to thigh)    Home Living  Prior Function            PT Goals (current goals can now be found in the care plan section) Progress towards PT goals: Progressing toward goals    Frequency    7X/week      PT Plan      Co-evaluation              AM-PAC PT 6 Clicks Mobility   Outcome Measure  Help needed turning from your back to your side while in a flat bed without using bedrails?: A Little Help needed moving from lying on your back to sitting on the side of a flat bed without using bedrails?: A Little Help needed moving to and from a bed to a chair (including a wheelchair)?: A Little Help needed  standing up from a chair using your arms (e.g., wheelchair or bedside chair)?: A Little Help needed to walk in hospital room?: A Little Help needed climbing 3-5 steps with a railing? : A Little 6 Click Score: 18    End of Session Equipment Utilized During Treatment: Gait belt Activity Tolerance: Patient limited by pain (limited by nausea and feeling hot) Patient left: in chair;with call bell/phone within reach;with family/visitor present   PT Visit Diagnosis: Other abnormalities of gait and mobility (R26.89);Pain Pain - Right/Left: Right Pain - part of body: Knee     Time: 8876-8852 PT Time Calculation (min) (ACUTE ONLY): 24 min  Charges:    $Gait Training: 8-22 mins $Therapeutic Exercise: 8-22 mins PT General Charges $$ ACUTE PT VISIT: 1 Visit                        Dannial SQUIBB, PT Acute Rehabilitation  Office: [phone]

## 2023-12-21 NOTE — Plan of Care (Signed)
  Problem: Education: Goal: Knowledge of General Education information will improve Description: Including pain rating scale, medication(s)/side effects and non-pharmacologic comfort measures Outcome: Adequate for Discharge   Problem: Health Behavior/Discharge Planning: Goal: Ability to manage health-related needs will improve Outcome: Adequate for Discharge   Problem: Clinical Measurements: Goal: Ability to maintain clinical measurements within normal limits will improve Outcome: Adequate for Discharge Goal: Will remain free from infection Outcome: Adequate for Discharge Goal: Diagnostic test results will improve Outcome: Adequate for Discharge Goal: Respiratory complications will improve Outcome: Adequate for Discharge Goal: Cardiovascular complication will be avoided Outcome: Adequate for Discharge   Problem: Activity: Goal: Risk for activity intolerance will decrease Outcome: Adequate for Discharge   Problem: Nutrition: Goal: Adequate nutrition will be maintained Outcome: Adequate for Discharge   Problem: Coping: Goal: Level of anxiety will decrease Outcome: Adequate for Discharge   Problem: Elimination: Goal: Will not experience complications related to bowel motility Outcome: Adequate for Discharge Goal: Will not experience complications related to urinary retention Outcome: Adequate for Discharge   Problem: Pain Managment: Goal: General experience of comfort will improve and/or be controlled Outcome: Adequate for Discharge   Problem: Safety: Goal: Ability to remain free from injury will improve Outcome: Adequate for Discharge   Problem: Skin Integrity: Goal: Risk for impaired skin integrity will decrease Outcome: Adequate for Discharge   Problem: Education: Goal: Ability to describe self-care measures that may prevent or decrease complications (Diabetes Survival Skills Education) will improve Outcome: Adequate for Discharge Goal: Individualized Educational  Video(s) Outcome: Adequate for Discharge   Problem: Coping: Goal: Ability to adjust to condition or change in health will improve Outcome: Adequate for Discharge   Problem: Fluid Volume: Goal: Ability to maintain a balanced intake and output will improve Outcome: Adequate for Discharge   Problem: Health Behavior/Discharge Planning: Goal: Ability to identify and utilize available resources and services will improve Outcome: Adequate for Discharge Goal: Ability to manage health-related needs will improve Outcome: Adequate for Discharge   Problem: Metabolic: Goal: Ability to maintain appropriate glucose levels will improve Outcome: Adequate for Discharge   Problem: Nutritional: Goal: Maintenance of adequate nutrition will improve Outcome: Adequate for Discharge Goal: Progress toward achieving an optimal weight will improve Outcome: Adequate for Discharge   Problem: Skin Integrity: Goal: Risk for impaired skin integrity will decrease Outcome: Adequate for Discharge   Problem: Tissue Perfusion: Goal: Adequacy of tissue perfusion will improve Outcome: Adequate for Discharge   Problem: Education: Goal: Knowledge of the prescribed therapeutic regimen will improve Outcome: Adequate for Discharge Goal: Individualized Educational Video(s) Outcome: Adequate for Discharge   Problem: Activity: Goal: Ability to avoid complications of mobility impairment will improve Outcome: Adequate for Discharge Goal: Range of joint motion will improve Outcome: Adequate for Discharge   Problem: Clinical Measurements: Goal: Postoperative complications will be avoided or minimized Outcome: Adequate for Discharge   Problem: Pain Management: Goal: Pain level will decrease with appropriate interventions Outcome: Adequate for Discharge   Problem: Skin Integrity: Goal: Will show signs of wound healing Outcome: Adequate for Discharge

## 2023-12-21 NOTE — Progress Notes (Signed)
 Discharge medications delivered to patient at the bedside in a secure bag.

## 2023-12-21 NOTE — Progress Notes (Signed)
 Physical Therapy Treatment Patient Details Name: ZYKERIA LAGUARDIA MRN: [id number] DOB: 02-Oct-1955 Today's Date: 12/21/2023   History of Present Illness 68 yo female admitted with R TKA 12/20/23. Hx of vertigo, obesity, shingles, osteoporosis, DM, back pain, RCR    PT Comments  2nd session to continue gait and stair training. Pt reports feeling better this afternoon compared to this morning. Moderate pain with activity. Issued HEP for pt to follow until she begins OP PT on Friday. Encouraged pt to ambulate often, as tolerated. PT education completed.    If plan is discharge home, recommend the following: A little help with walking and/or transfers;A little help with bathing/dressing/bathroom;Assistance with cooking/housework;Assist for transportation;Help with stairs or ramp for entrance   Can travel by private vehicle        Equipment Recommendations  None recommended by PT    Recommendations for Other Services       Precautions / Restrictions Precautions Precautions: Fall Restrictions Weight Bearing Restrictions Per Provider Order: No RLE Weight Bearing Per Provider Order: Weight bearing as tolerated     Mobility  Bed Mobility Overal bed mobility: Needs Assistance Bed Mobility: Supine to Sit          General bed mobility comments: oob in recliner    Transfers Overall transfer level: Needs assistance Equipment used: Rolling walker (2 wheels) Transfers: Sit to/from Stand Sit to Stand: Contact guard assist           General transfer comment: Cues for safety, technique hand/LE placement. Increased time.    Ambulation/Gait Ambulation/Gait assistance: Contact guard assist Gait Distance (Feet): 100 Feet Assistive device: Rolling walker (2 wheels) Gait Pattern/deviations: Step-to pattern       General Gait Details: Cues for safety, sequencing, proper use of RW. Slow gait speed. No overt LOB. Pt denied dizziness. Tolerated distance well.   Stairs Stairs:  Yes Stairs assistance: Contact guard assist Stair Management: Step to pattern, Forwards, With walker Number of Stairs: 1 General stair comments: Cues for safety, technique, sequencing. Caregiver present to observe   Wheelchair Mobility     Tilt Bed    Modified Rankin (Stroke Patients Only)       Balance Overall balance assessment: Needs assistance         Standing balance support: Bilateral upper extremity supported, During functional activity, Reliant on assistive device for balance Standing balance-Leahy Scale: Poor                              Communication Communication Communication: No apparent difficulties  Cognition Arousal: Alert Behavior During Therapy: WFL for tasks assessed/performed   PT - Cognitive impairments: No apparent impairments                         Following commands: Intact      Cueing Cueing Techniques: Verbal cues  Exercises     General Comments        Pertinent Vitals/Pain Pain Assessment Pain Assessment: 0-10 Pain Score: 6  Pain Location: R knee/thigh Pain Descriptors / Indicators: Aching Pain Intervention(s): Monitored during session    Home Living                          Prior Function            PT Goals (current goals can now be found in the care plan section) Progress towards PT goals: Progressing  toward goals    Frequency    7X/week      PT Plan      Co-evaluation              AM-PAC PT 6 Clicks Mobility   Outcome Measure  Help needed turning from your back to your side while in a flat bed without using bedrails?: A Little Help needed moving from lying on your back to sitting on the side of a flat bed without using bedrails?: A Little Help needed moving to and from a bed to a chair (including a wheelchair)?: A Little Help needed standing up from a chair using your arms (e.g., wheelchair or bedside chair)?: A Little Help needed to walk in hospital room?: A  Little Help needed climbing 3-5 steps with a railing? : A Little 6 Click Score: 18    End of Session Equipment Utilized During Treatment: Gait belt Activity Tolerance: Patient tolerated treatment well Patient left: in chair;with call bell/phone within reach;with family/visitor present   PT Visit Diagnosis: Other abnormalities of gait and mobility (R26.89);Pain Pain - Right/Left: Right Pain - part of body: Knee     Time: 1356-1411 PT Time Calculation (min) (ACUTE ONLY): 15 min  Charges:    $Gait Training: 8-22 mins $Therapeutic Exercise: 8-22 mins PT General Charges $$ ACUTE PT VISIT: 1 Visit                       Dannial SQUIBB, PT Acute Rehabilitation  Office: [phone]

## 2023-12-22 ENCOUNTER — Ambulatory Visit: Admitting: Family Medicine

## 2023-12-23 DIAGNOSIS — M25661 Stiffness of right knee, not elsewhere classified: Secondary | ICD-10-CM | POA: Diagnosis not present

## 2023-12-23 DIAGNOSIS — M25561 Pain in right knee: Secondary | ICD-10-CM | POA: Diagnosis not present

## 2023-12-27 DIAGNOSIS — M25561 Pain in right knee: Secondary | ICD-10-CM | POA: Diagnosis not present

## 2023-12-27 DIAGNOSIS — M25661 Stiffness of right knee, not elsewhere classified: Secondary | ICD-10-CM | POA: Diagnosis not present

## 2023-12-29 DIAGNOSIS — M25561 Pain in right knee: Secondary | ICD-10-CM | POA: Diagnosis not present

## 2023-12-29 DIAGNOSIS — M25661 Stiffness of right knee, not elsewhere classified: Secondary | ICD-10-CM | POA: Diagnosis not present

## 2023-12-29 NOTE — Discharge Summary (Signed)
 Patient ID: [name] [name] MRN: [id number] DOB/AGE: [birth year] 70 y.o.  Admit date: 12/20/2023 Discharge date: 12/21/2023  Admission Diagnoses:  Right knee osteoarthritis  Discharge Diagnoses:  Principal Problem:   S/P total knee arthroplasty, right   Past Medical History:  Diagnosis Date   Allergy 2000   Arthritis    Back pain 06/13/2015   Diabetes mellitus    started Metformin  12/2012   History of shingles 2012   back   Hyperlipidemia    Hypertension    Hypothyroidism 07/29/2010   S/p FNA showing non-neoplastic goiter 7/12 w/ Dr Jesus    Obesity 08/06/2016   Osteoporosis 08/06/2016   PONV (postoperative nausea and vomiting)    Vitamin D  deficiency 08/06/2016    Surgeries: Procedure(s): ARTHROPLASTY, KNEE, TOTAL on 12/20/2023   Consultants:   Discharged Condition: Improved  Hospital Course: [name] [name] is an 68 y.o. female who was admitted 12/20/2023 for operative treatment ofS/P total knee arthroplasty, right. Patient has severe unremitting pain that affects sleep, daily activities, and work/hobbies. After pre-op clearance the patient was taken to the operating room on 12/20/2023 and underwent  Procedure(s): ARTHROPLASTY, KNEE, TOTAL.    Patient was given perioperative antibiotics:  Anti-infectives (From admission, onward)    Start     Dose/Rate Route Frequency Ordered Stop   12/20/23 1515  ceFAZolin  (ANCEF ) IVPB 2g/100 mL premix        2 g 200 mL/hr over 30 Minutes Intravenous Every 6 hours 12/20/23 1424 12/20/23 2251   12/20/23 0600  ceFAZolin  (ANCEF ) IVPB 2g/100 mL premix        2 g 200 mL/hr over 30 Minutes Intravenous On call to O.R. 12/20/23 0539 12/20/23 0739        Patient was given sequential compression devices, early ambulation, and chemoprophylaxis to prevent DVT. Patient worked with PT and was meeting their goals regarding safe ambulation and transfers.  Patient benefited maximally from hospital stay and there were no complications.     Recent vital signs: No data found.   Recent laboratory studies: No results for input(s): WBC, HGB, HCT, PLT, NA, K, CL, CO2, BUN, CREATININE, GLUCOSE, INR, CALCIUM  in the last 72 hours.  Invalid input(s): PT, 2   Discharge Medications:   Allergies as of 12/21/2023       Reactions   Codeine Nausea And Vomiting   Tramadol  Nausea And Vomiting        Medication List     STOP taking these medications    aspirin EC 81 MG tablet Replaced by: Aspirin Low Dose 81 MG chewable tablet   ibuprofen 200 MG tablet Commonly known as: ADVIL       TAKE these medications    acetaminophen 500 MG tablet Commonly known as: TYLENOL Take 1,000 mg by mouth every 8 (eight) hours as needed for moderate pain (pain score 4-6).   amLODipine  5 MG tablet Commonly known as: NORVASC  Take 1 tablet (5 mg total) by mouth daily.   Aspirin Low Dose 81 MG chewable tablet Generic drug: aspirin Chew 1 tablet (81 mg total) by mouth 2 (two) times daily for 28 days. Replaces: aspirin EC 81 MG tablet   celecoxib 200 MG capsule Commonly known as: CeleBREX Take 1 capsule (200 mg total) by mouth 2 (two) times daily.   CITRACAL CALCIUM  +D3 PO Take 1 tablet by mouth daily.   glipiZIDE  10 MG 24 hr tablet Commonly known as: GLUCOTROL  XL Take 2 tablets (20 mg total) by mouth daily with breakfast.  HYDROmorphone  2 MG tablet Commonly known as: DILAUDID  Take 1 tablet (2 mg total) by mouth every 4 (four) hours as needed for severe pain (pain score 7-10) (pain score 7-10).   Januvia  100 MG tablet Generic drug: sitaGLIPtin  Take 1 tablet (100 mg total) by mouth daily.   levothyroxine  75 MCG tablet Commonly known as: SYNTHROID  Take 1 tablet (75 mcg total) by mouth daily before breakfast.   lisinopril  5 MG tablet Commonly known as: ZESTRIL  Take 1 tablet (5 mg total) by mouth daily.   methocarbamol 500 MG tablet Commonly known as: ROBAXIN Take 1 tablet (500 mg total) by  mouth every 6 (six) hours as needed for muscle spasms (thigh pain).   multivitamin tablet Take 1 tablet by mouth daily.   mupirocin ointment 2 % Commonly known as: BACTROBAN Place 1 Application into the nose 2 (two) times daily for 60 doses: Use as directed 2 times daily for 5 days every other week for 6 weeks.   ondansetron  4 MG tablet Commonly known as: ZOFRAN  Take 1 tablet (4 mg total) by mouth every 6 (six) hours as needed for nausea.   polyethylene glycol powder 17 GM/SCOOP powder Commonly known as: GLYCOLAX/MIRALAX Dissolve 1 capful (17g) in 4-8 ounces of liquid and take by mouth 2 (two) times daily.   rosuvastatin  5 MG tablet Commonly known as: CRESTOR  Take 1 tablet (5 mg total) by mouth daily.   senna 8.6 MG Tabs tablet Commonly known as: SENOKOT Take 2 tablets (17.2 mg total) by mouth at bedtime for 14 days.   vitamin C 1000 MG tablet Take 1,000 mg by mouth daily.               Discharge Care Instructions  (From admission, onward)           Start     Ordered   12/21/23 0000  Change dressing       Comments: Maintain surgical dressing until follow up in the clinic. If the edges start to pull up, may reinforce with tape. If the dressing is no longer working, may remove and cover with gauze and tape, but must keep the area dry and clean.  Call with any questions or concerns.   12/21/23 0834            Diagnostic Studies: MM 3D SCREENING MAMMOGRAM BILATERAL BREAST Result Date: 12/13/2023 CLINICAL DATA:  Screening. EXAM: DIGITAL SCREENING BILATERAL MAMMOGRAM WITH TOMOSYNTHESIS AND CAD TECHNIQUE: Bilateral screening digital craniocaudal and mediolateral oblique mammograms were obtained. Bilateral screening digital breast tomosynthesis was performed. The images were evaluated with computer-aided detection. COMPARISON:  Previous exam(s). ACR Breast Density Category c: The breasts are heterogeneously dense, which may obscure small masses. FINDINGS: There are no  findings suspicious for malignancy. IMPRESSION: No mammographic evidence of malignancy. A result letter of this screening mammogram will be mailed directly to the patient. RECOMMENDATION: Screening mammogram in one year. (Code:SM-B-01Y) BI-RADS CATEGORY  1: Negative. Electronically Signed   By: Norleen Croak M.D.   On: 12/13/2023 08:09    Disposition: Discharge disposition: 01-Home or Self Care       Discharge Instructions     Call MD / Call 911   Complete by: As directed    If you experience chest pain or shortness of breath, CALL 911 and be transported to the hospital emergency room.  If you develope a fever above 101 F, pus (white drainage) or increased drainage or redness at the wound, or calf pain, call your surgeon's office.  Change dressing   Complete by: As directed    Maintain surgical dressing until follow up in the clinic. If the edges start to pull up, may reinforce with tape. If the dressing is no longer working, may remove and cover with gauze and tape, but must keep the area dry and clean.  Call with any questions or concerns.   Constipation Prevention   Complete by: As directed    Drink plenty of fluids.  Prune juice may be helpful.  You may use a stool softener, such as Colace (over the counter) 100 mg twice a day.  Use MiraLax (over the counter) for constipation as needed.   Diet - low sodium heart healthy   Complete by: As directed    Increase activity slowly as tolerated   Complete by: As directed    Weight bearing as tolerated with assist device (walker, cane, etc) as directed, use it as long as suggested by your surgeon or therapist, typically at least 4-6 weeks.   Post-operative opioid taper instructions:   Complete by: As directed    POST-OPERATIVE OPIOID TAPER INSTRUCTIONS: It is important to wean off of your opioid medication as soon as possible. If you do not need pain medication after your surgery it is ok to stop day one. Opioids include: Codeine,  Hydrocodone(Norco, Vicodin), Oxycodone (Percocet, oxycontin ) and hydromorphone  amongst others.  Long term and even short term use of opiods can cause: Increased pain response Dependence Constipation Depression Respiratory depression And more.  Withdrawal symptoms can include Flu like symptoms Nausea, vomiting And more Techniques to manage these symptoms Hydrate well Eat regular healthy meals Stay active Use relaxation techniques(deep breathing, meditating, yoga) Do Not substitute Alcohol to help with tapering If you have been on opioids for less than two weeks and do not have pain than it is ok to stop all together.  Plan to wean off of opioids This plan should start within one week post op of your joint replacement. Maintain the same interval or time between taking each dose and first decrease the dose.  Cut the total daily intake of opioids by one tablet each day Next start to increase the time between doses. The last dose that should be eliminated is the evening dose.      TED hose   Complete by: As directed    Use stockings (TED hose) for 2 weeks on both leg(s).  You may remove them at night for sleeping.          Signed: Rosina JONELLE Calin 12/29/2023, 6:49 AM

## 2024-01-02 ENCOUNTER — Other Ambulatory Visit (HOSPITAL_BASED_OUTPATIENT_CLINIC_OR_DEPARTMENT_OTHER)

## 2024-01-02 DIAGNOSIS — M25561 Pain in right knee: Secondary | ICD-10-CM | POA: Diagnosis not present

## 2024-01-04 DIAGNOSIS — M25561 Pain in right knee: Secondary | ICD-10-CM | POA: Diagnosis not present

## 2024-01-06 DIAGNOSIS — M25561 Pain in right knee: Secondary | ICD-10-CM | POA: Diagnosis not present

## 2024-01-09 ENCOUNTER — Other Ambulatory Visit (HOSPITAL_COMMUNITY): Payer: Self-pay

## 2024-01-17 DIAGNOSIS — M25561 Pain in right knee: Secondary | ICD-10-CM | POA: Diagnosis not present

## 2024-01-17 DIAGNOSIS — M25661 Stiffness of right knee, not elsewhere classified: Secondary | ICD-10-CM | POA: Diagnosis not present

## 2024-01-19 ENCOUNTER — Other Ambulatory Visit: Payer: Self-pay | Admitting: Family Medicine

## 2024-01-19 DIAGNOSIS — M25661 Stiffness of right knee, not elsewhere classified: Secondary | ICD-10-CM | POA: Diagnosis not present

## 2024-01-19 DIAGNOSIS — M25561 Pain in right knee: Secondary | ICD-10-CM | POA: Diagnosis not present

## 2024-01-19 DIAGNOSIS — I10 Essential (primary) hypertension: Secondary | ICD-10-CM

## 2024-01-23 DIAGNOSIS — M25661 Stiffness of right knee, not elsewhere classified: Secondary | ICD-10-CM | POA: Diagnosis not present

## 2024-01-23 DIAGNOSIS — M25561 Pain in right knee: Secondary | ICD-10-CM | POA: Diagnosis not present

## 2024-01-24 ENCOUNTER — Telehealth: Payer: Self-pay

## 2024-01-24 NOTE — Telephone Encounter (Signed)
 Called patient to discuss Januvia  application. Walked her thru what she needed to complete. She will drop application off at the office later this week.

## 2024-01-24 NOTE — Telephone Encounter (Signed)
 Copied from CRM #[id number]. Topic: Clinical - Medical Advice >> Jan 24, 2024 10:25 AM Jayma L wrote: Reason for CRM: patient called in asking to speak to Tammy about having paperwork filled out for JANUVIA  100 MG tablet . Said she got some paperwork and needs some help. If you can please call her back at [phone]

## 2024-01-26 DIAGNOSIS — M25561 Pain in right knee: Secondary | ICD-10-CM | POA: Diagnosis not present

## 2024-01-30 DIAGNOSIS — M25561 Pain in right knee: Secondary | ICD-10-CM | POA: Diagnosis not present

## 2024-01-30 DIAGNOSIS — M25661 Stiffness of right knee, not elsewhere classified: Secondary | ICD-10-CM | POA: Diagnosis not present

## 2024-01-31 ENCOUNTER — Telehealth: Payer: Self-pay

## 2024-01-31 NOTE — Telephone Encounter (Signed)
 PAP: Patient assistance application for Januvia through [employer] has been mailed to pt's home address on file. Provider portion of application will be faxed to provider's office.

## 2024-02-01 NOTE — Telephone Encounter (Signed)
 Patient brought in Energy manager. Completed provider portion and forwarded to Dr Domenica to review and sign.

## 2024-02-06 ENCOUNTER — Other Ambulatory Visit (HOSPITAL_COMMUNITY): Payer: Self-pay

## 2024-02-06 NOTE — Telephone Encounter (Signed)
 PAP: Application for Januvia has been submitted to [employer], via fax

## 2024-02-12 NOTE — Assessment & Plan Note (Signed)
 Supplement and monitor

## 2024-02-12 NOTE — Assessment & Plan Note (Signed)
 Encouraged to get adequate exercise, calcium and vitamin d intake

## 2024-02-12 NOTE — Assessment & Plan Note (Signed)
 hgba1c acceptable, minimize simple carbs. Increase exercise as tolerated. Continue current meds

## 2024-02-12 NOTE — Progress Notes (Unsigned)
 "  Subjective:    Patient ID: [name] [name], female    DOB: March 18, 1955, 68 y.o.   MRN: [id number]  No chief complaint on file.   HPI Discussed the use of AI scribe software for clinical note transcription with the patient, who gave verbal consent to proceed.  History of Present Illness [name] [name] is a 68 year old female who presents for follow-up after right knee surgery.  She underwent right knee surgery almost eight weeks ago and stayed one night in the hospital post-operatively. She uses extra strength Tylenol  for pain management due to allergies to tramadol  and codeine, which cause nausea.  She is working on improving her mobility, particularly with stair navigation, and is able to walk up her driveway to get the mail. She performs exercises at home and uses a Kiwi bike to maintain movement. She experiences tightness in the knee, especially in the mornings, but it improves with movement. She can pull her knee to her chest without pain while lying down, but experiences tightness when weight-bearing initially.  Post-surgery, she experienced a high white blood cell count and stress, particularly due to a prolonged numbness from a nerve block that lasted five hours. She was concerned about her ability to walk again during this time.  Her bowel movements were initially slow post-surgery, requiring Miralax  for the first week, but have since normalized without the need for ongoing medication. She maintains a high fiber diet and drinks primarily water.  She reports no fevers, chills, urinary symptoms, or respiratory issues since the surgery.    Past Medical History:  Diagnosis Date   Allergy 2000   Arthritis    Back pain 06/13/2015   Diabetes mellitus    started Metformin  12/2012   History of shingles 2012   back   Hyperlipidemia    Hypertension    Hypothyroidism 07/29/2010   S/p FNA showing non-neoplastic goiter 7/12 w/ Dr [name] [name]    Obesity 08/06/2016    Osteoporosis 08/06/2016   PONV (postoperative nausea and vomiting)    Vitamin D  deficiency 08/06/2016    Past Surgical History:  Procedure Laterality Date   ABDOMINAL HYSTERECTOMY  1997   APPENDECTOMY     MASS EXCISION  03/02/2011   Procedure: EXCISION MASS;  Surgeon: [name] JONELLE Curia, MD;  Location: [facility];  Service: Orthopedics;  Laterality: Right;  Excision Cyst Interphangeal Right Thumb   OOPHORECTOMY     SHOULDER ARTHROSCOPY W/ ROTATOR CUFF REPAIR  2007   rt   TONSILLECTOMY     TOTAL KNEE ARTHROPLASTY Right 12/20/2023   Procedure: ARTHROPLASTY, KNEE, TOTAL;  Surgeon: [name] Cough, MD;  Location: [facility];  Service: Orthopedics;  Laterality: Right;   TRIGGER FINGER RELEASE  11/01/2011   Procedure: RELEASE TRIGGER FINGER/A-1 PULLEY;  Surgeon: [name] JONELLE Curia, MD;  Location: [facility];  Service: Orthopedics;  Laterality: Right;  right thumb, possible excision cyst    Family History  Problem Relation Age of Onset   Hypertension Mother    Diabetes Mother    Breast cancer Mother 21   Other Mother    Hypertension Father    Cancer Father        throat   Alcohol abuse Father    Cancer Maternal Aunt    Breast cancer Maternal Aunt    Cancer Maternal Grandmother    Heart disease Maternal Grandfather    Heart disease Paternal Grandmother    Breast cancer Cousin    Breast cancer Cousin  Hypertension Brother    Heart disease Brother        heart murmur   Alcohol abuse Brother     Social History   Socioeconomic History   Marital status: Widowed    Spouse name: Not on file   Number of children: Not on file   Years of education: Not on file   Highest education level: 12th grade  Occupational History   Occupation: physiological scientist  Tobacco Use   Smoking status: Never   Smokeless tobacco: Never  Vaping Use   Vaping status: Never Used  Substance and Sexual Activity   Alcohol use: No   Drug use: No    Sexual activity: Not Currently    Partners: Male    Comment: lives with husband, works at [employer], no major dietary   Other Topics Concern   Not on file  Social History Narrative   Widowed; step daughterRegular exercise: walk 2 days a weekCaffeine use: 2 cups of coffee daily   Social Drivers of Health   Tobacco Use: Low Risk (12/20/2023)   Patient History    Smoking Tobacco Use: Never    Smokeless Tobacco Use: Never    Passive Exposure: Not on file  Financial Resource Strain: Low Risk (02/06/2024)   Overall Financial Resource Strain (CARDIA)    Difficulty of Paying Living Expenses: Not very hard  Food Insecurity: No Food Insecurity (02/06/2024)   Epic    Worried About Radiation Protection Practitioner of Food in the Last Year: Never true    Ran Out of Food in the Last Year: Never true  Transportation Needs: No Transportation Needs (02/06/2024)   Epic    Lack of Transportation (Medical): No    Lack of Transportation (Non-Medical): No  Physical Activity: Inactive (02/06/2024)   Exercise Vital Sign    Days of Exercise per Week: 0 days    Minutes of Exercise per Session: Not on file  Stress: Stress Concern Present (02/06/2024)   [employer] of Occupational Health - Occupational Stress Questionnaire    Feeling of Stress: To some extent  Social Connections: Moderately Integrated (02/06/2024)   Social Connection and Isolation Panel    Frequency of Communication with Friends and Family: More than three times a week    Frequency of Social Gatherings with Friends and Family: Twice a week    Attends Religious Services: More than 4 times per year    Active Member of [employer] or Organizations: Yes    Attends Banker Meetings: More than 4 times per year    Marital Status: Widowed  Recent Concern: Social Connections - Moderately Isolated (12/20/2023)   Social Connection and Isolation Panel    Frequency of Communication with Friends and Family: More than three times a week     Frequency of Social Gatherings with Friends and Family: More than three times a week    Attends Religious Services: More than 4 times per year    Active Member of [employer] or Organizations: No    Attends Banker Meetings: Never    Marital Status: Widowed  Intimate Partner Violence: Not At Risk (12/20/2023)   Epic    Fear of Current or Ex-Partner: No    Emotionally Abused: No    Physically Abused: No    Sexually Abused: No  Depression (PHQ2-9): Low Risk (10/25/2023)   Depression (PHQ2-9)    PHQ-2 Score: 0  Alcohol Screen: Low Risk (10/25/2023)   Alcohol Screen    Last Alcohol Screening Score (AUDIT): 1  Housing: Unknown (02/06/2024)   Epic    Unable to Pay for Housing in the Last Year: No    Number of Times Moved in the Last Year: Not on file    Homeless in the Last Year: No  Utilities: Not At Risk (12/20/2023)   Epic    Threatened with loss of utilities: No  Health Literacy: Adequate Health Literacy (10/25/2023)   B1300 Health Literacy    Frequency of need for help with medical instructions: Never    Outpatient Medications Prior to Visit  Medication Sig Dispense Refill   acetaminophen  (TYLENOL ) 500 MG tablet Take 1,000 mg by mouth every 8 (eight) hours as needed for moderate pain (pain score 4-6).     amLODipine  (NORVASC ) 5 MG tablet Take 1 tablet (5 mg total) by mouth daily. 90 tablet 1   Ascorbic Acid (VITAMIN C) 1000 MG tablet Take 1,000 mg by mouth daily.     Calcium -Magnesium-Vitamin D  (CITRACAL CALCIUM  +D3 PO) Take 1 tablet by mouth daily.     glipiZIDE  (GLUCOTROL  XL) 10 MG 24 hr tablet Take 2 tablets (20 mg total) by mouth daily with breakfast. 180 tablet 1   HYDROmorphone  (DILAUDID ) 2 MG tablet Take 1 tablet (2 mg total) by mouth every 4 (four) hours as needed for severe pain (pain score 7-10) (pain score 7-10). 42 tablet 0   JANUVIA  100 MG tablet Take 1 tablet (100 mg total) by mouth daily. 90 tablet 1   levothyroxine  (SYNTHROID ) 75 MCG  tablet Take 1 tablet (75 mcg total) by mouth daily before breakfast. 90 tablet 1   lisinopril  (ZESTRIL ) 5 MG tablet Take 1 tablet (5 mg total) by mouth daily. 90 tablet 1   methocarbamol  (ROBAXIN ) 500 MG tablet Take 1 tablet (500 mg total) by mouth every 6 (six) hours as needed for muscle spasms (thigh pain). 40 tablet 0   Multiple Vitamin (MULTIVITAMIN) tablet Take 1 tablet by mouth daily.     ondansetron  (ZOFRAN ) 4 MG tablet Take 1 tablet (4 mg total) by mouth every 6 (six) hours as needed for nausea. 20 tablet 0   polyethylene glycol powder (GLYCOLAX /MIRALAX ) 17 GM/SCOOP powder Dissolve 1 capful (17g) in 4-8 ounces of liquid and take by mouth 2 (two) times daily. 238 g 0   rosuvastatin  (CRESTOR ) 5 MG tablet Take 1 tablet (5 mg total) by mouth daily. 90 tablet 1   No facility-administered medications prior to visit.    Allergies[1]  Review of Systems  Constitutional:  Negative for fever and malaise/fatigue.  HENT:  Negative for congestion.   Eyes:  Negative for blurred vision.  Respiratory:  Negative for shortness of breath.   Cardiovascular:  Positive for leg swelling. Negative for chest pain and palpitations.  Gastrointestinal:  Negative for abdominal pain, blood in stool and nausea.  Genitourinary:  Negative for dysuria and frequency.  Musculoskeletal:  Positive for joint pain. Negative for falls.  Skin:  Negative for rash.  Neurological:  Negative for dizziness, loss of consciousness and headaches.  Endo/Heme/Allergies:  Negative for environmental allergies.  Psychiatric/Behavioral:  Negative for depression. The patient is not nervous/anxious.        Objective:    Physical Exam Constitutional:      General: She is not in acute distress.    Appearance: Normal appearance. She is well-developed. She is not toxic-appearing.  HENT:     Head: Normocephalic and atraumatic.     Right Ear: External ear normal.     Left Ear: External ear normal.  Nose: Nose normal.  Eyes:      General:        Right eye: No discharge.        Left eye: No discharge.     Conjunctiva/sclera: Conjunctivae normal.  Neck:     Thyroid : No thyromegaly.  Cardiovascular:     Rate and Rhythm: Normal rate and regular rhythm.     Heart sounds: Normal heart sounds. No murmur heard. Pulmonary:     Effort: Pulmonary effort is normal. No respiratory distress.     Breath sounds: Normal breath sounds.  Abdominal:     General: Bowel sounds are normal.     Palpations: Abdomen is soft.     Tenderness: There is no abdominal tenderness. There is no guarding.  Musculoskeletal:        General: Normal range of motion.     Cervical back: Neck supple.  Lymphadenopathy:     Cervical: No cervical adenopathy.  Skin:    General: Skin is warm and dry.  Neurological:     Mental Status: She is alert and oriented to person, place, and time.  Psychiatric:        Mood and Affect: Mood normal.        Behavior: Behavior normal.        Thought Content: Thought content normal.        Judgment: Judgment normal.    There were no vitals taken for this visit. Wt Readings from Last 3 Encounters:  12/20/23 197 lb (89.4 kg)  12/12/23 197 lb (89.4 kg)  10/25/23 201 lb (91.2 kg)    Diabetic Foot Exam - Simple   No data filed    Lab Results  Component Value Date   WBC 16.2 (H) 12/21/2023   HGB 12.2 12/21/2023   HCT 37.2 12/21/2023   PLT 211 12/21/2023   GLUCOSE 165 (H) 12/21/2023   CHOL 172 09/28/2023   TRIG 93.0 09/28/2023   HDL 64.00 09/28/2023   LDLCALC 89 09/28/2023   ALT 16 09/28/2023   AST 14 09/28/2023   NA 138 12/21/2023   K 4.7 12/21/2023   CL 103 12/21/2023   CREATININE 0.86 12/21/2023   BUN 14 12/21/2023   CO2 24 12/21/2023   TSH 3.49 06/20/2023   HGBA1C 6.9 (H) 12/12/2023   MICROALBUR 4.2 (H) 06/20/2023    Lab Results  Component Value Date   TSH 3.49 06/20/2023   Lab Results  Component Value Date   WBC 16.2 (H) 12/21/2023   HGB 12.2 12/21/2023   HCT 37.2 12/21/2023    MCV 90.5 12/21/2023   PLT 211 12/21/2023   Lab Results  Component Value Date   NA 138 12/21/2023   K 4.7 12/21/2023   CO2 24 12/21/2023   GLUCOSE 165 (H) 12/21/2023   BUN 14 12/21/2023   CREATININE 0.86 12/21/2023   BILITOT 0.5 09/28/2023   ALKPHOS 58 09/28/2023   AST 14 09/28/2023   ALT 16 09/28/2023   PROT 6.9 09/28/2023   ALBUMIN 4.6 09/28/2023   CALCIUM  9.5 12/21/2023   ANIONGAP 10 12/21/2023   GFR 70.79 09/28/2023   Lab Results  Component Value Date   CHOL 172 09/28/2023   Lab Results  Component Value Date   HDL 64.00 09/28/2023   Lab Results  Component Value Date   LDLCALC 89 09/28/2023   Lab Results  Component Value Date   TRIG 93.0 09/28/2023   Lab Results  Component Value Date   CHOLHDL 3 09/28/2023   Lab Results  Component Value Date   HGBA1C 6.9 (H) 12/12/2023       Assessment & Plan:  Vitamin D  deficiency Assessment & Plan: Supplement and monitor    Osteoporosis, post-menopausal Assessment & Plan: Encouraged to get adequate exercise, calcium  and vitamin d  intake    Hyperlipidemia with target LDL less than 100 Assessment & Plan: Encourage heart healthy diet such as MIND or DASH diet, increase exercise, avoid trans fats, simple carbohydrates and processed foods, consider a krill or fish or flaxseed oil cap daily.  Tolerating Rosuvastatin    Essential hypertension Assessment & Plan: Well controlled, no changes to meds. Encouraged heart healthy diet such as the DASH diet and exercise as tolerated.     Type 2 diabetes mellitus without complication, without long-term current use of insulin  (HCC) Assessment & Plan: hgba1c acceptable, minimize simple carbs. Increase exercise as tolerated. Continue current meds       Assessment and Plan Assessment & Plan Orthopedic aftercare following right knee surgery Eight weeks post right knee surgery with satisfactory range of motion. Experiencing tightness and difficulty with stairs, but  improving. No significant pain with movement, but tightness in the morning. No complications post-surgery. High white blood cell count post-surgery likely due to stress and pain, now resolved. No fever, chills, or other symptoms. - Continue physical therapy and home exercises to improve strength and mobility. - Use ice machine as needed for pain management. - Scheduled lab appointment for late January or early February to monitor recovery.  Type 2 diabetes mellitus A1c not due until the end of January. No current issues reported. - Scheduled lab appointment for A1c testing in late January or early February.  Hyperlipidemia Cholesterol levels not due for testing until the end of January. No current issues reported. - Scheduled lab appointment for cholesterol testing in late January or early February.  Essential hypertension Hypertension management discussed, including medication adherence.  General Health Maintenance Discussion on vaccinations including RSV, pneumococcal, tetanus, COVID, and flu. Emphasis on the importance of vaccinations to prevent respiratory infections, especially as she ages. RSV vaccine recommended due to its contagious nature and potential severity in the elderly. Prevnar 20 vaccine discussed as a future consideration. Tetanus vaccine due in 2029. Annual COVID and flu shots recommended due to their potential to cause pneumonia and hospitalization. - Consider RSV vaccine, especially if exposed to high-risk environments. - Consider Prevnar 20 vaccine in the future. - Ensure tetanus vaccine is up to date, with next dose due in 2029. - Receive annual COVID and flu vaccinations.  Recording duration: 22 minutes     Harlene Horton, MD     [1] Allergies Allergen Reactions   Codeine Nausea And Vomiting   Tramadol  Nausea And Vomiting  "

## 2024-02-12 NOTE — Assessment & Plan Note (Signed)
 Well controlled, no changes to meds. Encouraged heart healthy diet such as the DASH diet and exercise as tolerated.

## 2024-02-12 NOTE — Assessment & Plan Note (Signed)
 Encourage heart healthy diet such as MIND or DASH diet, increase exercise, avoid trans fats, simple carbohydrates and processed foods, consider a krill or fish or flaxseed oil cap daily. Tolerating Rosuvastatin

## 2024-02-13 ENCOUNTER — Encounter: Payer: Self-pay | Admitting: Family Medicine

## 2024-02-13 ENCOUNTER — Ambulatory Visit: Admitting: Family Medicine

## 2024-02-13 VITALS — BP 126/82 | HR 76 | Temp 97.8°F | Resp 16 | Ht 67.0 in | Wt 199.0 lb

## 2024-02-13 DIAGNOSIS — I10 Essential (primary) hypertension: Secondary | ICD-10-CM

## 2024-02-13 DIAGNOSIS — E785 Hyperlipidemia, unspecified: Secondary | ICD-10-CM | POA: Diagnosis not present

## 2024-02-13 DIAGNOSIS — E559 Vitamin D deficiency, unspecified: Secondary | ICD-10-CM | POA: Diagnosis not present

## 2024-02-13 DIAGNOSIS — E119 Type 2 diabetes mellitus without complications: Secondary | ICD-10-CM

## 2024-02-13 DIAGNOSIS — M810 Age-related osteoporosis without current pathological fracture: Secondary | ICD-10-CM

## 2024-02-13 NOTE — Patient Instructions (Addendum)
 All [facility] pharmacies are now walk in vaccine clinics, M-F 9-4  RSV, Respiratory Syncitial Virus Vaccine, Arexvy vaccine  Annual flu and covid vaccine

## 2024-02-20 ENCOUNTER — Encounter: Payer: Self-pay | Admitting: Family Medicine

## 2024-02-20 NOTE — Telephone Encounter (Signed)
 Joel Russell BIRCH   02/20/2024 12:37 PM  The patient is calling because she received her application back from [employer] and it was not approved due to a missing signature on page 2. The application is for Januvia . The patient is requesting a callback from Tammy. Patietn can be reached at [phone]

## 2024-02-29 ENCOUNTER — Other Ambulatory Visit: Payer: Self-pay | Admitting: Family Medicine

## 2024-03-01 NOTE — Telephone Encounter (Signed)
 Reached out to patient regarding PAP application for Januvia  [employer]) missing information and [employer] has mailed the application back with the attestation letter to the patient home address. Left HIPAA compliant v/m to return call for assistance.

## 2024-03-13 NOTE — Telephone Encounter (Signed)
 Followed up with [employer] regarding Pap application received for missing MD signature on pg 4. [employer] did not receive the fax sent- so will follow up with office .

## 2024-03-14 ENCOUNTER — Telehealth: Payer: Self-pay

## 2024-03-14 NOTE — Telephone Encounter (Signed)
 Copied from CRM #[id number]. Topic: Clinical - Medical Advice >> Jan 24, 2024 10:25 AM Jayma L wrote: Reason for CRM: patient called in asking to speak to Tammy about having paperwork filled out for JANUVIA  100 MG tablet . Said she got some paperwork and needs some help. If you can please call her back at [phone] >> Mar 14, 2024 12:03 PM Drema MATSU wrote: Pt is wanting to leave a message for Tammy regarding Januvia . She stated that she was told that it was missing a signature from Dr. Domenica. Please call patient. >> Feb 20, 2024 12:37 PM Antwanette L wrote: The patient is calling because she received her application back from [employer] and it was not approved due to a missing signature on page 2. The application is for Januvia . The patient is requesting a callback from Tammy. Patietn can be reached at [phone]

## 2024-03-14 NOTE — Telephone Encounter (Signed)
 Refaxed page 4 to [employer] with updated PCP signature in both required areas.

## 2024-03-15 ENCOUNTER — Other Ambulatory Visit

## 2024-03-16 NOTE — Telephone Encounter (Signed)
Already addressed - see mychart message

## 2024-03-21 ENCOUNTER — Other Ambulatory Visit

## 2024-06-19 ENCOUNTER — Encounter: Admitting: Student

## 2024-06-21 ENCOUNTER — Encounter: Admitting: Family Medicine

## 2024-07-30 ENCOUNTER — Encounter: Admitting: Family Medicine

## 2024-11-06 ENCOUNTER — Ambulatory Visit
